# Patient Record
Sex: Female | Born: 1940 | Race: White | Hispanic: No | Marital: Single | Smoking: Former smoker
Health system: Southern US, Community
[De-identification: ages and names within clinical notes are randomized; demographics above are authoritative.]

## PROBLEM LIST (undated history)

## (undated) DIAGNOSIS — G20 Parkinson's disease: Secondary | ICD-10-CM

## (undated) DIAGNOSIS — I2510 Atherosclerotic heart disease of native coronary artery without angina pectoris: Secondary | ICD-10-CM

## (undated) DIAGNOSIS — I219 Acute myocardial infarction, unspecified: Secondary | ICD-10-CM

## (undated) DIAGNOSIS — F329 Major depressive disorder, single episode, unspecified: Secondary | ICD-10-CM

## (undated) DIAGNOSIS — F419 Anxiety disorder, unspecified: Secondary | ICD-10-CM

## (undated) DIAGNOSIS — E0500 Thyrotoxicosis with diffuse goiter without thyrotoxic crisis or storm: Secondary | ICD-10-CM

## (undated) DIAGNOSIS — M797 Fibromyalgia: Secondary | ICD-10-CM

## (undated) DIAGNOSIS — R011 Cardiac murmur, unspecified: Secondary | ICD-10-CM

## (undated) DIAGNOSIS — F32A Depression, unspecified: Secondary | ICD-10-CM

## (undated) DIAGNOSIS — G20A1 Parkinson's disease without dyskinesia, without mention of fluctuations: Secondary | ICD-10-CM

## (undated) DIAGNOSIS — M549 Dorsalgia, unspecified: Secondary | ICD-10-CM

## (undated) DIAGNOSIS — K759 Inflammatory liver disease, unspecified: Secondary | ICD-10-CM

## (undated) DIAGNOSIS — E039 Hypothyroidism, unspecified: Secondary | ICD-10-CM

## (undated) DIAGNOSIS — K219 Gastro-esophageal reflux disease without esophagitis: Secondary | ICD-10-CM

## (undated) DIAGNOSIS — E7800 Pure hypercholesterolemia, unspecified: Secondary | ICD-10-CM

## (undated) HISTORY — PX: ABDOMINAL HYSTERECTOMY: SHX81

## (undated) HISTORY — PX: TUBAL LIGATION: SHX77

## (undated) HISTORY — DX: Thyrotoxicosis with diffuse goiter without thyrotoxic crisis or storm: E05.00

## (undated) HISTORY — PX: ANTERIOR (CYSTOCELE) AND POSTERIOR REPAIR (RECTOCELE) WITH XENFORM GRAFT AND SACROSPINOUS FIXATION: SHX6492

## (undated) HISTORY — DX: Parkinson's disease without dyskinesia, without mention of fluctuations: G20.A1

## (undated) HISTORY — DX: Parkinson's disease: G20

## (undated) HISTORY — DX: Acute myocardial infarction, unspecified: I21.9

---

## 1998-05-20 ENCOUNTER — Emergency Department (HOSPITAL_COMMUNITY): Admission: EM | Admit: 1998-05-20 | Discharge: 1998-05-21 | Payer: Self-pay | Admitting: Internal Medicine

## 1998-08-18 ENCOUNTER — Other Ambulatory Visit: Admission: RE | Admit: 1998-08-18 | Discharge: 1998-08-18 | Payer: Self-pay | Admitting: Obstetrics and Gynecology

## 2001-08-01 ENCOUNTER — Other Ambulatory Visit: Admission: RE | Admit: 2001-08-01 | Discharge: 2001-08-01 | Payer: Self-pay | Admitting: Obstetrics and Gynecology

## 2001-09-08 ENCOUNTER — Encounter: Payer: Self-pay | Admitting: Surgery

## 2001-09-08 ENCOUNTER — Encounter: Admission: RE | Admit: 2001-09-08 | Discharge: 2001-09-08 | Payer: Self-pay | Admitting: Surgery

## 2002-01-26 ENCOUNTER — Encounter: Payer: Self-pay | Admitting: Obstetrics and Gynecology

## 2002-01-27 ENCOUNTER — Inpatient Hospital Stay (HOSPITAL_COMMUNITY): Admission: RE | Admit: 2002-01-27 | Discharge: 2002-01-29 | Payer: Self-pay | Admitting: Obstetrics and Gynecology

## 2002-03-01 ENCOUNTER — Emergency Department (HOSPITAL_COMMUNITY): Admission: EM | Admit: 2002-03-01 | Discharge: 2002-03-01 | Payer: Self-pay | Admitting: Emergency Medicine

## 2005-09-27 ENCOUNTER — Ambulatory Visit (HOSPITAL_COMMUNITY): Admission: RE | Admit: 2005-09-27 | Discharge: 2005-09-27 | Payer: Self-pay | Admitting: Orthopedic Surgery

## 2016-05-07 ENCOUNTER — Emergency Department (HOSPITAL_COMMUNITY): Payer: Medicare Other

## 2016-05-07 ENCOUNTER — Emergency Department (HOSPITAL_COMMUNITY)
Admission: EM | Admit: 2016-05-07 | Discharge: 2016-05-07 | Disposition: A | Discharge status: Home / Self Care | Payer: Medicare Other | Attending: Emergency Medicine | Admitting: Emergency Medicine

## 2016-05-07 DIAGNOSIS — J111 Influenza due to unidentified influenza virus with other respiratory manifestations: Secondary | ICD-10-CM

## 2016-05-07 DIAGNOSIS — R062 Wheezing: Secondary | ICD-10-CM | POA: Insufficient documentation

## 2016-05-07 DIAGNOSIS — J40 Bronchitis, not specified as acute or chronic: Secondary | ICD-10-CM | POA: Insufficient documentation

## 2016-05-07 DIAGNOSIS — Z79899 Other long term (current) drug therapy: Secondary | ICD-10-CM | POA: Diagnosis not present

## 2016-05-07 DIAGNOSIS — R791 Abnormal coagulation profile: Secondary | ICD-10-CM | POA: Insufficient documentation

## 2016-05-07 DIAGNOSIS — J988 Other specified respiratory disorders: Secondary | ICD-10-CM | POA: Diagnosis not present

## 2016-05-07 DIAGNOSIS — R509 Fever, unspecified: Secondary | ICD-10-CM | POA: Diagnosis present

## 2016-05-07 DIAGNOSIS — Z7982 Long term (current) use of aspirin: Secondary | ICD-10-CM | POA: Insufficient documentation

## 2016-05-07 DIAGNOSIS — R69 Illness, unspecified: Secondary | ICD-10-CM

## 2016-05-07 LAB — CBC WITH DIFFERENTIAL/PLATELET
BASOS ABS: 0 10*3/uL (ref 0.0–0.1)
BASOS PCT: 1 %
Eosinophils Absolute: 0.1 10*3/uL (ref 0.0–0.7)
Eosinophils Relative: 2 %
HCT: 43 % (ref 36.0–46.0)
Hemoglobin: 14.2 g/dL (ref 12.0–15.0)
Lymphocytes Relative: 42 %
Lymphs Abs: 2.6 10*3/uL (ref 0.7–4.0)
MCH: 32.6 pg (ref 26.0–34.0)
MCHC: 33 g/dL (ref 30.0–36.0)
MCV: 98.9 fL (ref 78.0–100.0)
MONO ABS: 0.5 10*3/uL (ref 0.1–1.0)
MONOS PCT: 8 %
Neutro Abs: 2.9 10*3/uL (ref 1.7–7.7)
Neutrophils Relative %: 47 %
Platelets: 201 10*3/uL (ref 150–400)
RBC: 4.35 MIL/uL (ref 3.87–5.11)
RDW: 14.2 % (ref 11.5–15.5)
WBC: 6.2 10*3/uL (ref 4.0–10.5)

## 2016-05-07 LAB — URINALYSIS, ROUTINE W REFLEX MICROSCOPIC
Bilirubin Urine: NEGATIVE
Glucose, UA: NEGATIVE mg/dL
Hgb urine dipstick: NEGATIVE
KETONES UR: NEGATIVE mg/dL
LEUKOCYTES UA: NEGATIVE
NITRITE: NEGATIVE
PH: 7 (ref 5.0–8.0)
Protein, ur: NEGATIVE mg/dL
Specific Gravity, Urine: 1.006 (ref 1.005–1.030)

## 2016-05-07 LAB — COMPREHENSIVE METABOLIC PANEL
ALK PHOS: 72 U/L (ref 38–126)
ALT: 17 U/L (ref 14–54)
AST: 28 U/L (ref 15–41)
Albumin: 3.1 g/dL — ABNORMAL LOW (ref 3.5–5.0)
Anion gap: 9 (ref 5–15)
BILIRUBIN TOTAL: 0.5 mg/dL (ref 0.3–1.2)
BUN: 7 mg/dL (ref 6–20)
CALCIUM: 8.9 mg/dL (ref 8.9–10.3)
CO2: 26 mmol/L (ref 22–32)
CREATININE: 0.89 mg/dL (ref 0.44–1.00)
Chloride: 100 mmol/L — ABNORMAL LOW (ref 101–111)
GFR calc Af Amer: >60 mL/min (ref >60)
GLUCOSE: 104 mg/dL — AB (ref 65–99)
POTASSIUM: 4.2 mmol/L (ref 3.5–5.1)
Sodium: 135 mmol/L (ref 135–145)
TOTAL PROTEIN: 6.6 g/dL (ref 6.5–8.1)

## 2016-05-07 LAB — I-STAT CG4 LACTIC ACID, ED
LACTIC ACID, VENOUS: 0.66 mmol/L (ref 0.5–1.9)
Lactic Acid, Venous: 0.99 mmol/L (ref 0.5–1.9)

## 2016-05-07 LAB — PROTIME-INR
INR: 1.07
Prothrombin Time: 13.9 seconds (ref 11.4–15.2)

## 2016-05-07 MED ORDER — HYDROCODONE-ACETAMINOPHEN 5-325 MG PO TABS
2.0000 | ORAL_TABLET | Freq: Once | ORAL | Status: AC
  Administered 2016-05-07: 2 via ORAL
  Filled 2016-05-07: qty 2

## 2016-05-07 MED ORDER — DOXYCYCLINE HYCLATE 100 MG PO CAPS: 100.0000 mg | ORAL_CAPSULE | Freq: Two times a day (BID) | ORAL | 0 refills | Status: DC

## 2016-05-07 MED ORDER — DOXYCYCLINE HYCLATE 100 MG PO TABS
100.0000 mg | ORAL_TABLET | Freq: Once | ORAL | Status: AC
  Administered 2016-05-07: 100 mg via ORAL
  Filled 2016-05-07: qty 1

## 2016-05-07 MED ORDER — AEROCHAMBER PLUS W/MASK MISC
1.0000 | Freq: Once | Status: DC
  Filled 2016-05-07: qty 1

## 2016-05-07 MED ORDER — IPRATROPIUM-ALBUTEROL 0.5-2.5 (3) MG/3ML IN SOLN
3.0000 mL | Freq: Once | RESPIRATORY_TRACT | Status: AC
  Administered 2016-05-07: 3 mL via RESPIRATORY_TRACT
  Filled 2016-05-07: qty 3

## 2016-05-07 MED ORDER — SODIUM CHLORIDE 0.9 % IV BOLUS (SEPSIS)
1000.0000 mL | Freq: Once | INTRAVENOUS | Status: AC
  Administered 2016-05-07: 1000 mL via INTRAVENOUS

## 2016-05-07 MED ORDER — METHYLPREDNISOLONE SODIUM SUCC 125 MG IJ SOLR
125.0000 mg | Freq: Once | INTRAMUSCULAR | Status: AC
  Administered 2016-05-07: 125 mg via INTRAVENOUS
  Filled 2016-05-07: qty 2

## 2016-05-07 MED ORDER — ALBUTEROL SULFATE HFA 108 (90 BASE) MCG/ACT IN AERS
2.0000 | INHALATION_SPRAY | Freq: Once | RESPIRATORY_TRACT | Status: AC
  Administered 2016-05-07: 2 via RESPIRATORY_TRACT
  Filled 2016-05-07: qty 6.7

## 2016-05-07 MED ORDER — PREDNISONE 20 MG PO TABS
40.0000 mg | ORAL_TABLET | Freq: Every day | ORAL | 0 refills | Status: AC
Start: 2016-05-07 — End: 2016-05-12

## 2016-05-07 MED ORDER — OSELTAMIVIR PHOSPHATE 75 MG PO CAPS: 75.0000 mg | ORAL_CAPSULE | Freq: Two times a day (BID) | ORAL | 0 refills | Status: DC

## 2016-05-07 MED ORDER — HYDROCODONE-HOMATROPINE 5-1.5 MG/5ML PO SYRP: 5.0000 mL | ORAL_SOLUTION | ORAL | 0 refills | Status: DC | PRN

## 2016-05-07 NOTE — ED Triage Notes (Addendum)
Cough and body aches since Friday has had chills, states her daughters family had this last week, fever, hurts rt chest when she coughs

## 2016-05-07 NOTE — ED Provider Notes (Signed)
[facility]-EMERGENCY DEPT Provider Note   CSN: [id number] Arrival date & time: 05/07/16  1202     History   Chief Complaint Chief Complaint  Patient presents with  . Cough  . Generalized Body Aches  . Fever    HPI Laura Fritz is a 76 y.o. female.  HPI    76 year old female with history of chronic tobacco dependence, chronic pain, who presents with fever, cough, and myalgias. The patient's symptoms started on Thursday of last week with a mild headache. Over the next 24 hours, she developed progressively worsening productive cough, body aches, and chills. She has had persistent symptoms throughout the weekend with decreased appetite. She has also noticed a mild, sharp, right-sided chest pain that is after coughing. Denies any pain with inspiration. She subsequently presents for evaluation. She does have known sick contacts in her grandchildren, who had similar symptoms. Denies any formal history of COPD but she does use an inhaler as needed intermittently. Denies any chest pain similar to her previous coronary symptoms.  No past medical history on file.  There are no active problems to display for this patient.   No past surgical history on file.  OB History    No data available       Home Medications    Prior to Admission medications   Medication Sig Start Date End Date Taking? Authorizing Provider  amitriptyline (ELAVIL) 25 MG tablet Take 25 mg by mouth at bedtime.   Yes Historical Provider, MD  aspirin EC 81 MG tablet Take 81 mg by mouth daily.   Yes Historical Provider, MD  atenolol (TENORMIN) 25 MG tablet Take 25 mg by mouth 2 (two) times daily.   Yes Historical Provider, MD  Cholecalciferol (VITAMIN D3) 50000 units CAPS Take 50,000 Units by mouth every 14 (fourteen) days.    Yes Historical Provider, MD  clopidogrel (PLAVIX) 75 MG tablet Take 75 mg by mouth daily.   Yes Historical Provider, MD  diphenhydrAMINE (BENADRYL) 25 MG tablet Take 25 mg by mouth every 8 (eight)  hours as needed for itching or allergies.   Yes Historical Provider, MD  gabapentin (NEURONTIN) 300 MG capsule Take 1,200 mg by mouth at bedtime.    Yes Historical Provider, MD  HYDROcodone-acetaminophen (NORCO) 7.5-325 MG tablet Take 1 tablet by mouth every 6 (six) hours as needed for moderate pain.   Yes Historical Provider, MD  loperamide (IMODIUM A-D) 2 MG tablet Take 2 mg by mouth 4 (four) times daily as needed for diarrhea or loose stools.   Yes Historical Provider, MD  Melatonin 10 MG TABS Take 10 mg by mouth at bedtime.   Yes Historical Provider, MD  omeprazole (PRILOSEC) 20 MG capsule Take 20 mg by mouth daily.   Yes Historical Provider, MD  simvastatin (ZOCOR) 20 MG tablet Take 20 mg by mouth daily at 6 PM.    Yes Historical Provider, MD  Vilazodone HCl (VIIBRYD) 40 MG TABS Take 40 mg by mouth daily.    Yes Historical Provider, MD  doxycycline (VIBRAMYCIN) 100 MG capsule Take 1 capsule (100 mg total) by mouth 2 (two) times daily. 05/07/16   Shaune Pollack, MD  HYDROcodone-homatropine [facility]) 5-1.5 MG/5ML syrup Take 5 mLs by mouth every 4 (four) hours as needed for cough. 05/07/16   Shaune Pollack, MD  oseltamivir (TAMIFLU) 75 MG capsule Take 1 capsule (75 mg total) by mouth every 12 (twelve) hours. 05/07/16   Shaune Pollack, MD  predniSONE (DELTASONE) 20 MG tablet Take 2 tablets (40 mg  total) by mouth daily. 05/07/16 05/12/16  Shaune Pollackameron Angad Nabers, MD    Family History No family history on file.  Social History Social History  Substance Use Topics  . Smoking status: Not on file  . Smokeless tobacco: Not on file  . Alcohol use Not on file     Allergies   Tramadol; Levofloxacin; Nsaids; and Sulfa antibiotics   Review of Systems Review of Systems  Constitutional: Positive for chills, fatigue and fever.  HENT: Positive for sore throat. Negative for congestion and rhinorrhea.   Eyes: Negative for visual disturbance.  Respiratory: Positive for cough and shortness of breath. Negative for  wheezing.   Cardiovascular: Negative for chest pain and leg swelling.  Gastrointestinal: Negative for abdominal pain, diarrhea, nausea and vomiting.  Genitourinary: Negative for dysuria, flank pain, vaginal bleeding and vaginal discharge.  Musculoskeletal: Positive for arthralgias and myalgias. Negative for neck pain.  Skin: Negative for rash.  Allergic/Immunologic: Negative for immunocompromised state.  Neurological: Negative for syncope and headaches.  Hematological: Does not bruise/bleed easily.  All other systems reviewed and are negative.    Physical Exam Updated Vital Signs BP 122/92   Pulse (!) 58   Temp 98.4 F (36.9 C) (Oral)   Resp 17   Ht 5\' 4"  (1.626 m)   Wt 143 lb (64.9 kg)   SpO2 100%   BMI 24.55 kg/m   Physical Exam  Constitutional: She is oriented to person, place, and time. She appears well-developed and well-nourished. No distress.  HENT:  Head: Normocephalic and atraumatic.  Eyes: Conjunctivae are normal.  Neck: Neck supple.  Cardiovascular: Normal rate, regular rhythm and normal heart sounds.  Exam reveals no friction rub.   No murmur heard. Pulmonary/Chest: She is in respiratory distress. She has decreased breath sounds. She has wheezes in the right upper field, the right middle field, the right lower field, the left upper field, the left middle field and the left lower field. She has no rales.  Abdominal: She exhibits no distension.  Musculoskeletal: She exhibits no edema.  Neurological: She is alert and oriented to person, place, and time. She exhibits normal muscle tone.  Skin: Skin is warm. Capillary refill takes less than 2 seconds.  Psychiatric: She has a normal mood and affect.  Nursing note and vitals reviewed.    ED Treatments / Results  Labs (all labs ordered are listed, but only abnormal results are displayed) Labs Reviewed  COMPREHENSIVE METABOLIC PANEL - Abnormal; Notable for the following:       Result Value   Chloride 100 (*)     Glucose, Bld 104 (*)    Albumin 3.1 (*)    All other components within normal limits  URINALYSIS, ROUTINE W REFLEX MICROSCOPIC - Abnormal; Notable for the following:    APPearance HAZY (*)    All other components within normal limits  URINE CULTURE  CBC WITH DIFFERENTIAL/PLATELET  PROTIME-INR  I-STAT CG4 LACTIC ACID, ED  I-STAT CG4 LACTIC ACID, ED    EKG  EKG Interpretation None       Radiology Dg Chest 2 View  Result Date: 05/07/2016 CLINICAL DATA:  Cough, shortness of breath for 3 days EXAM: CHEST  2 VIEW COMPARISON:  09/26/2005 FINDINGS: The heart size and mediastinal contours are within normal limits. Both lungs are clear. The visualized skeletal structures are unremarkable. IMPRESSION: No active cardiopulmonary disease. Electronically Signed   By: Elige KoHetal  Patel   On: 05/07/2016 12:55    Procedures Procedures (including critical care time)  Medications  Ordered in ED Medications  aerochamber plus with mask device 1 each (1 each Other Not Given 05/07/16 1752)  sodium chloride 0.9 % bolus 1,000 mL (0 mLs Intravenous Stopped 05/07/16 1756)  HYDROcodone-acetaminophen (NORCO/VICODIN) 5-325 MG per tablet 2 tablet (2 tablets Oral Given 05/07/16 1548)  methylPREDNISolone sodium succinate (SOLU-MEDROL) 125 mg/2 mL injection 125 mg (125 mg Intravenous Given 05/07/16 1548)  ipratropium-albuterol (DUONEB) 0.5-2.5 (3) MG/3ML nebulizer solution 3 mL (3 mLs Nebulization Given 05/07/16 1548)  doxycycline (VIBRA-TABS) tablet 100 mg (100 mg Oral Given 05/07/16 1548)  albuterol (PROVENTIL HFA;VENTOLIN HFA) 108 (90 Base) MCG/ACT inhaler 2 puff (2 puffs Inhalation Given 05/07/16 1753)     Initial Impression / Assessment and Plan / ED Course  I have reviewed the triage vital signs and the nursing notes.  Pertinent labs & imaging results that were available during my care of the patient were reviewed by me and considered in my medical decision making (see chart for details).  Clinical Course      76 year old female with past medical history as above who presents with cough, generalized body aches, and fevers. On arrival, patient overall very well-appearing. She is satting well on room air with normal work of breathing. She does have diffuse expiratory wheezing but is speaking in full sentences. Labwork reviewed and is overall very reassuring. She has no leukocytosis, AKI, and lactic acid is normal. She has no evidence to suggest systemic illness or sepsis. Suspect patient has viral URI versus influenza versus COPD exacerbation. She has been given fluids, steroids, and a breathing treatment here with resolution of her wheezing. She would like to go home which I feel is reasonable given her otherwise well appearance reassuring labs. Will place her on antibiotics and steroids for her COPD component, start Tamiflu, and advise close outpatient follow-up with good return precautions.  Final Clinical Impressions(s) / ED Diagnoses   Final diagnoses:  Wheezing-associated respiratory infection (WARI)  Bronchitis  Influenza-like illness    New Prescriptions New Prescriptions   DOXYCYCLINE (VIBRAMYCIN) 100 MG CAPSULE    Take 1 capsule (100 mg total) by mouth 2 (two) times daily.   HYDROCODONE-HOMATROPINE (HYCODAN) 5-1.5 MG/5ML SYRUP    Take 5 mLs by mouth every 4 (four) hours as needed for cough.   OSELTAMIVIR (TAMIFLU) 75 MG CAPSULE    Take 1 capsule (75 mg total) by mouth every 12 (twelve) hours.   PREDNISONE (DELTASONE) 20 MG TABLET    Take 2 tablets (40 mg total) by mouth daily.     Shaune Pollack, MD 05/07/16 [phone]

## 2016-05-07 NOTE — ED Notes (Signed)
PT did not have to use RR at this time  

## 2016-05-07 NOTE — ED Notes (Signed)
IV removed.

## 2016-05-08 LAB — URINE CULTURE

## 2016-05-13 ENCOUNTER — Emergency Department (HOSPITAL_COMMUNITY)
Admission: EM | Admit: 2016-05-13 | Discharge: 2016-05-14 | Disposition: A | Discharge status: Rehabilitation Facility | Payer: Medicare Other | Attending: Emergency Medicine | Admitting: Emergency Medicine

## 2016-05-13 ENCOUNTER — Encounter (HOSPITAL_COMMUNITY): Payer: Self-pay | Admitting: *Deleted

## 2016-05-13 DIAGNOSIS — Z87891 Personal history of nicotine dependence: Secondary | ICD-10-CM | POA: Insufficient documentation

## 2016-05-13 DIAGNOSIS — R45851 Suicidal ideations: Secondary | ICD-10-CM | POA: Diagnosis not present

## 2016-05-13 DIAGNOSIS — Z79899 Other long term (current) drug therapy: Secondary | ICD-10-CM | POA: Insufficient documentation

## 2016-05-13 DIAGNOSIS — F1123 Opioid dependence with withdrawal: Secondary | ICD-10-CM

## 2016-05-13 DIAGNOSIS — F1193 Opioid use, unspecified with withdrawal: Secondary | ICD-10-CM

## 2016-05-13 DIAGNOSIS — Z7982 Long term (current) use of aspirin: Secondary | ICD-10-CM | POA: Diagnosis not present

## 2016-05-13 DIAGNOSIS — Z7902 Long term (current) use of antithrombotics/antiplatelets: Secondary | ICD-10-CM | POA: Diagnosis not present

## 2016-05-13 DIAGNOSIS — F332 Major depressive disorder, recurrent severe without psychotic features: Secondary | ICD-10-CM | POA: Diagnosis present

## 2016-05-13 DIAGNOSIS — F1120 Opioid dependence, uncomplicated: Secondary | ICD-10-CM

## 2016-05-13 DIAGNOSIS — Z888 Allergy status to other drugs, medicaments and biological substances status: Secondary | ICD-10-CM | POA: Diagnosis not present

## 2016-05-13 HISTORY — DX: Fibromyalgia: M79.7

## 2016-05-13 HISTORY — DX: Major depressive disorder, single episode, unspecified: F32.9

## 2016-05-13 HISTORY — DX: Anxiety disorder, unspecified: F41.9

## 2016-05-13 HISTORY — DX: Dorsalgia, unspecified: M54.9

## 2016-05-13 HISTORY — DX: Gastro-esophageal reflux disease without esophagitis: K21.9

## 2016-05-13 HISTORY — DX: Pure hypercholesterolemia, unspecified: E78.00

## 2016-05-13 HISTORY — DX: Depression, unspecified: F32.A

## 2016-05-13 LAB — CBC
HCT: 41.5 % (ref 36.0–46.0)
Hemoglobin: 14.2 g/dL (ref 12.0–15.0)
MCH: 32.3 pg (ref 26.0–34.0)
MCHC: 34.2 g/dL (ref 30.0–36.0)
MCV: 94.5 fL (ref 78.0–100.0)
PLATELETS: 274 10*3/uL (ref 150–400)
RBC: 4.39 MIL/uL (ref 3.87–5.11)
RDW: 13.8 % (ref 11.5–15.5)
WBC: 10.5 10*3/uL (ref 4.0–10.5)

## 2016-05-13 LAB — COMPREHENSIVE METABOLIC PANEL
ALK PHOS: 78 U/L (ref 38–126)
ALT: 20 U/L (ref 14–54)
ANION GAP: 9 (ref 5–15)
AST: 22 U/L (ref 15–41)
Albumin: 3.4 g/dL — ABNORMAL LOW (ref 3.5–5.0)
BUN: 15 mg/dL (ref 6–20)
CALCIUM: 8.7 mg/dL — AB (ref 8.9–10.3)
CHLORIDE: 107 mmol/L (ref 101–111)
CO2: 26 mmol/L (ref 22–32)
Creatinine, Ser: 0.8 mg/dL (ref 0.44–1.00)
Glucose, Bld: 93 mg/dL (ref 65–99)
Potassium: 3.3 mmol/L — ABNORMAL LOW (ref 3.5–5.1)
SODIUM: 142 mmol/L (ref 135–145)
Total Bilirubin: 0.7 mg/dL (ref 0.3–1.2)
Total Protein: 6.4 g/dL — ABNORMAL LOW (ref 6.5–8.1)

## 2016-05-13 LAB — RAPID URINE DRUG SCREEN, HOSP PERFORMED
Amphetamines: NOT DETECTED
BENZODIAZEPINES: NOT DETECTED
Barbiturates: NOT DETECTED
COCAINE: NOT DETECTED
Opiates: POSITIVE — AB
Tetrahydrocannabinol: NOT DETECTED

## 2016-05-13 LAB — URINALYSIS, ROUTINE W REFLEX MICROSCOPIC
Bilirubin Urine: NEGATIVE
GLUCOSE, UA: NEGATIVE mg/dL
Hgb urine dipstick: NEGATIVE
Ketones, ur: NEGATIVE mg/dL
LEUKOCYTES UA: NEGATIVE
NITRITE: NEGATIVE
PH: 5 (ref 5.0–8.0)
Protein, ur: NEGATIVE mg/dL
Specific Gravity, Urine: 1.017 (ref 1.005–1.030)

## 2016-05-13 LAB — LIPASE, BLOOD: Lipase: 19 U/L (ref 11–51)

## 2016-05-13 LAB — SALICYLATE LEVEL

## 2016-05-13 LAB — ACETAMINOPHEN LEVEL

## 2016-05-13 LAB — ETHANOL

## 2016-05-13 MED ORDER — ACETAMINOPHEN 500 MG PO TABS
1000.0000 mg | ORAL_TABLET | Freq: Once | ORAL | Status: AC
  Administered 2016-05-13: 1000 mg via ORAL
  Filled 2016-05-13: qty 2

## 2016-05-13 MED ORDER — LOPERAMIDE HCL 2 MG PO CAPS
4.0000 mg | ORAL_CAPSULE | Freq: Once | ORAL | Status: AC
  Administered 2016-05-13: 4 mg via ORAL
  Filled 2016-05-13: qty 2

## 2016-05-13 MED ORDER — ONDANSETRON 4 MG PO TBDP
4.0000 mg | ORAL_TABLET | Freq: Once | ORAL | Status: AC
  Administered 2016-05-13: 4 mg via ORAL
  Filled 2016-05-13: qty 1

## 2016-05-13 NOTE — ED Notes (Signed)
No vomiting or diarrhea noted since pt's arrival; pt has been resting with eyes closed

## 2016-05-13 NOTE — ED Notes (Signed)
Pt's belongings Pink pants Pink shirt Pink shoes Black flowered bag Be[employer] HomesBrown wallet Pink notebook Denture cup with upper denture Denture cup without dentures fixodent 2 packs of cigarettes Change purse Black cell phone with a Counselling psychologistcharger Scissors Pill box with meds Glasses Sanitary pads rantidine  Melatonin Nitrostat viibyrd Simvastatin Gabapentin immodium x2 Vitamin d3 Omeprazole Aspirin atenololclopidogrel proventil inhaler doxycycline

## 2016-05-13 NOTE — [facility] Counselor (Signed)
Pt has been fax[facility] the [facility], [facility].  Princess BruinsAquicha Duff, MSW, Theresia MajorsLCSWA

## 2016-05-13 NOTE — ED Notes (Signed)
Per TTS pt qualifies for inpatient admission and is awaiting Geri-pysch placement

## 2016-05-13 NOTE — ED Triage Notes (Signed)
Pt arrives to the ER via EMS for complaints of SI and opoid withdrawal; pt states that she was seen on 1/15 and was diagnosed with Bronchitis and the flu; pt states that she was feeling better and began having N/V/D and body aches 3 days ago; pt states that she has been on hydrocodone for years and has not had any in a week; pt states that she believes some of the symptoms are related to withdrawal; pt states that she has depression that has gotten worse and states "I don't want to live anymore" pt states that she has a plan to overdose and states that she just hoped that the flu would have killed her; pt states that her husband died 6 yrs ago and she is still having trouble dealing with it; pt states that her house was foreclosed on in Nov and she has been living with her daughter and states "She just doesn't have the patience for me"; pt also reports that she had a daughter die when the child was [age]yr old and her son was murdered when he was 16411yr old; pt states that this has all just been too much and she cannot deal with it anymore and just wants to die

## 2016-05-13 NOTE — ED Notes (Signed)
Bed: [id number] Expected date:  Expected time:  Means of arrival:  Comments: 76 yo SI

## 2016-05-13 NOTE — ED Provider Notes (Signed)
[facility]-EMERGENCY DEPT Provider Note   CSN: [id number] Arrival date & time: 05/13/16  1709     History   Chief Complaint Chief Complaint  Patient presents with  . Suicidal    HPI Laura Fritz is a 76 y.o. female.  HPI 76 year old female who presents with opiate withdrawal and suicidal ideation. She has a history of chronic back pain, anxiety and depression. States that she has been visiting her daughter since Christmas time from [place]. States that she ran out of her hydrocodone on January 15, and had initial plans to return to [place] but had influenza-like symptoms for which she was seen in the ED. This prevented her from returning to [place]. Over the past week she has had intermittent nausea, vomiting, diarrhea, body aches and pains, intermittent abdominal cramping, intermittent headache. States that she thinks she has an withdrawing from her hydrocodone which she did not know was in opiate until recently. Had fevers or chills. Denies any alcohol abuse.  Also states a long-standing history of depression. States that things have been getting worse since her husband died 6 years ago. States that she wakes up every morning feeling suicidal and wanting to be with her husband. States that her daughter doesn't want to help her and doesn't have the patient's for her. Also states that she had children that died in the 60s to 90s which initially started her depression. She says that recently her home in [place] has been foreclosed. Due to all the recent stressors she has had increasing suicidal thoughts. States that she would have overdosed on her pills.    Past Medical History:  Diagnosis Date  . Anxiety   . Back pain   . Depression   . Fibromyalgia   . GERD (gastroesophageal reflux disease)   . Hypercholesteremia     There are no active problems to display for this patient.   History reviewed. No pertinent surgical history.  OB History    No data  available       Home Medications    Prior to Admission medications   Medication Sig Start Date End Date Taking? Authorizing Provider  amitriptyline (ELAVIL) 25 MG tablet Take 25 mg by mouth at bedtime.   Yes Historical Provider, MD  amphetamine-dextroamphetamine (ADDERALL) 10 MG tablet Take 20 mg by mouth 2 (two) times daily with a meal. Take 20mg  twice daily with meals in the morning and at Noon   Yes Historical Provider, MD  aspirin EC 81 MG tablet Take 81 mg by mouth daily.   Yes Historical Provider, MD  atenolol (TENORMIN) 25 MG tablet Take 25 mg by mouth 2 (two) times daily.   Yes Historical Provider, MD  Cholecalciferol (VITAMIN D3) 50000 units CAPS Take 50,000 Units by mouth every 14 (fourteen) days.    Yes Historical Provider, MD  clopidogrel (PLAVIX) 75 MG tablet Take 75 mg by mouth daily.   Yes Historical Provider, MD  diphenhydrAMINE (BENADRYL) 25 MG tablet Take 25 mg by mouth every 8 (eight) hours as needed for itching or allergies.   Yes Historical Provider, MD  doxycycline (VIBRAMYCIN) 100 MG capsule Take 1 capsule (100 mg total) by mouth 2 (two) times daily. 05/07/16  Yes Shaune Pollack, MD  gabapentin (NEURONTIN) 300 MG capsule Take 1,200 mg by mouth at bedtime.    Yes Historical Provider, MD  loperamide (IMODIUM A-D) 2 MG tablet Take 8 mg by mouth 4 (four) times daily as needed for diarrhea or loose stools.  Yes Historical Provider, MD  Melatonin 10 MG TABS Take 10 mg by mouth at bedtime.   Yes Historical Provider, MD  metoprolol tartrate (LOPRESSOR) 25 MG tablet Take 25 mg by mouth 2 (two) times daily.   Yes Historical Provider, MD  nitroGLYCERIN (NITROSTAT) 0.4 MG SL tablet Place 0.4 mg under the tongue every 5 (five) minutes as needed for chest pain.   Yes Historical Provider, MD  omeprazole (PRILOSEC) 20 MG capsule Take 20 mg by mouth daily.   Yes Historical Provider, MD  simvastatin (ZOCOR) 20 MG tablet Take 20 mg by mouth daily at 6 PM.    Yes Historical Provider, MD    Vilazodone HCl (VIIBRYD) 40 MG TABS Take 40 mg by mouth daily.    Yes Historical Provider, MD  HYDROcodone-acetaminophen (NORCO) 7.5-325 MG tablet Take 1 tablet by mouth every 6 (six) hours as needed for moderate pain.    Historical Provider, MD  HYDROcodone-homatropine (HYCODAN) 5-1.5 MG/5ML syrup Take 5 mLs by mouth every 4 (four) hours as needed for cough. Patient not taking: Reported on 05/13/2016 05/07/16   Shaune Pollackameron Isaacs, MD  oseltamivir (TAMIFLU) 75 MG capsule Take 1 capsule (75 mg total) by mouth every 12 (twelve) hours. Patient not taking: Reported on 05/13/2016 05/07/16   Shaune Pollackameron Isaacs, MD    Family History No family history on file.  Social History Social History  Substance Use Topics  . Smoking status: Former Games developermoker  . Smokeless tobacco: Never Used  . Alcohol use Yes     Allergies   Tramadol; Levofloxacin; Nsaids; and Sulfa antibiotics   Review of Systems Review of Systems 10/14 systems reviewed and are negative other than those stated in the HPI   Physical Exam Updated Vital Signs BP 125/72 (BP Location: Right Arm)   Pulse 75   Temp 99.1 F (37.3 C) (Oral)   Resp 18   Ht 5\' 4"  (1.626 m)   Wt 140 lb (63.5 kg)   SpO2 91%   BMI 24.03 kg/m   Physical Exam Physical Exam  Nursing note and vitals reviewed. Constitutional:non-toxic, and in no acute distress Head: Normocephalic and atraumatic.  Mouth/Throat: Oropharynx is clear and moist.  Neck: Normal range of motion. Neck supple.  Cardiovascular: Normal rate and regular rhythm.   Pulmonary/Chest: Effort normal and breath sounds normal.  Abdominal: Soft. There is no tenderness. There is no rebound and no guarding.  Musculoskeletal: Normal range of motion.  Neurological: Alert, no facial droop, fluent speech, moves all extremities symmetrically Skin: Skin is warm and dry.  Psychiatric: Cooperative   ED Treatments / Results  Labs (all labs ordered are listed, but only abnormal results are displayed) Labs  Reviewed  COMPREHENSIVE METABOLIC PANEL - Abnormal; Notable for the following:       Result Value   Potassium 3.3 (*)    Calcium 8.7 (*)    Total Protein 6.4 (*)    Albumin 3.4 (*)    All other components within normal limits  ACETAMINOPHEN LEVEL - Abnormal; Notable for the following:    Acetaminophen (Tylenol), Serum <10 (*)    All other components within normal limits  RAPID URINE DRUG SCREEN, HOSP PERFORMED - Abnormal; Notable for the following:    Opiates POSITIVE (*)    All other components within normal limits  ETHANOL  SALICYLATE LEVEL  CBC  LIPASE, BLOOD  URINALYSIS, ROUTINE W REFLEX MICROSCOPIC    EKG  EKG Interpretation None       Radiology No results found.  Procedures  Procedures (including critical care time)  Medications Ordered in ED Medications  acetaminophen (TYLENOL) tablet 650 mg (not administered)  LORazepam (ATIVAN) tablet 1 mg (not administered)  ondansetron (ZOFRAN) tablet 4 mg (not administered)  loperamide (IMODIUM) capsule 4 mg (not administered)  amitriptyline (ELAVIL) tablet 25 mg (not administered)  amphetamine-dextroamphetamine (ADDERALL) tablet 20 mg (not administered)  aspirin EC tablet 81 mg (not administered)  atenolol (TENORMIN) tablet 25 mg (not administered)  Vitamin D3 CAPS 50,000 Units (not administered)  clopidogrel (PLAVIX) tablet 75 mg (not administered)  gabapentin (NEURONTIN) capsule 1,200 mg (not administered)  Melatonin TABS 10 mg (not administered)  metoprolol tartrate (LOPRESSOR) tablet 25 mg (not administered)  pantoprazole (PROTONIX) EC tablet 40 mg (not administered)  simvastatin (ZOCOR) tablet 20 mg (not administered)  Vilazodone HCl (VIIBRYD) TABS 40 mg (not administered)  ondansetron (ZOFRAN-ODT) disintegrating tablet 4 mg (4 mg Oral Given 05/13/16 1841)  loperamide (IMODIUM) capsule 4 mg (4 mg Oral Given 05/13/16 1841)  acetaminophen (TYLENOL) tablet 1,000 mg (1,000 mg Oral Given 05/13/16 1841)     Initial  Impression / Assessment and Plan / ED Course  I have reviewed the triage vital signs and the nursing notes.  Pertinent labs & imaging results that were available during my care of the patient were reviewed by me and considered in my medical decision making (see chart for details).     Will treat symptoms of opiate withdrawal, and we'll treat symptomatically in ED. Vital signs stable. Blood work reassuring. No major electrolyte or metabolic derangements. Medically cleared for TTS consult for severe depression and SI.  TTS recommending inpatient admission, with placement pending at this time.   Final Clinical Impressions(s) / ED Diagnoses   Final diagnoses:  Suicidal ideation  Opiate withdrawal (HCC)    New Prescriptions New Prescriptions   No medications on file     Lavera Guise, MD 05/14/16 0120

## 2016-05-13 NOTE — BH Assessment (Signed)
Tele Assessment Note   Laura Fritz is an 76 y.o. female who presents to the ED voluntarily. [place] [facility] [place] [place] to be with her children. Pt reports she was not able to go [facility] due to becoming ill and getting the flu while she was here. Pt reports she was living with her daughter while she was here but she is currently homeless because her daughter does not want her to stay with her any longer. Pt reports she [place] her birthday which was [birth year] due to a foreclosure.    Pt reports she has been severely depressed since her husband passed away 6 years ago. Pt tearful throughout the assessment. Pt reports her depression began when her 74 year old daughter passed away in [id number]. Pt reports her son was killed 2 years later in a random act. Pt reports she continued to be depressed throughout her life but it worsened after her husband died 6 years ago. Pt reports she was married for 27 years. Pt was crying and stated "I had the best husband in the world." Pt reports she had 6 children with her husband.   Pt continued to state that she is "tired" and "wants to die." Pt denies an active plan but states she has been planning her death for some time and has been giving away her personal belongings in the event she dies. Pt states she wakes up and thinks "maybe today is the day I will finally die." Pt states she recently got over the flu and she was hoping the flu would kill her.    Pt reports she has a [facility] that she [facility] and she was taking Hydrocodone for many years. Pt reports due to not being [facility], she has been going through opioid withdrawals. Pt reports multiple physical ailments including diarrhea, feeling naesuos, and fibromyalgia pain, pain in her hips, and numerous falls. Pt reports she is always in pain and does not want to be alive.   Per Nira ConnJason Berry, FNP pt meets  inpt criteria for gero-psych placement. TTS to seek. Case disposition reviewed with Elmarie Shileyiffany, RN.    Diagnosis: Major Depressive D/O severe, w/o psychotic features   Past Medical History:  Past Medical History:  Diagnosis Date   Anxiety    Back pain    Depression    Fibromyalgia    GERD (gastroesophageal reflux disease)    Hypercholesteremia     History reviewed. No pertinent surgical history.  Family History: No family history on file.  Social History:  reports that she has quit smoking. She has never used smokeless tobacco. She reports that she drinks alcohol. She reports that she does not use drugs.  Additional Social History:  Alcohol / Drug Use Pain Medications: See PTA meds  Prescriptions: See PTA meds  Over the Counter: See PTA meds  History of alcohol / drug use?: No history of alcohol / drug abuse  CIWA: CIWA-Ar BP: 140/82 Pulse Rate: 75 COWS:    PATIENT STRENGTHS: (choose at least two) Average or above average intelligence Capable of independent living Communication skills Financial means Religious Affiliation  Allergies:  Allergies  Allergen Reactions   Tramadol Anaphylaxis    Oral cavity tremors   Levofloxacin Other (See Comments)    Tendonitis   Nsaids Rash   Sulfa Antibiotics Rash    Home Medications:  (  Not in a hospital admission)  OB/GYN Status:  No LMP recorded. Patient is postmenopausal.  General Assessment Data Location of Assessment: [facility] TTS Assessment: In system Is this a Tele or Face-to-Face Assessment?: Face-to-Face Is this an Initial Assessment or a Re-assessment for this encounter?: Initial Assessment Marital status: Widowed Is patient pregnant?: No Pregnancy Status: No Living Arrangements: Other (Comment) (homeless) Can pt return to current living arrangement?: Yes Admission Status: Voluntary Is patient capable of signing voluntary admission?: Yes Referral Source: Self/Family/Friend Insurance type: Medicare      Crisis Care Plan Living Arrangements: Other (Comment) (homeless) Name of Psychiatrist: Dr. Corky Downs in [facility]  Name of Therapist: Dr. Corky Downs in [facility]  Education Status Is patient currently in school?: No Highest grade of school patient has completed: college  Name of school: [employer]  Risk to self with the past 6 months Suicidal Ideation: Yes-Currently Present Has patient been a risk to self within the past 6 months prior to admission? : Yes Suicidal Intent: No Has patient had any suicidal intent within the past 6 months prior to admission? : No Is patient at risk for suicide?: Yes Suicidal Plan?: No Has patient had any suicidal plan within the past 6 months prior to admission? : No Access to Means: No What has been your use of drugs/alcohol within the last 12 months?: denies Previous Attempts/Gestures: No Triggers for Past Attempts: None known Intentional Self Injurious Behavior: None Family Suicide History: No Recent stressful life event(s): Loss (Comment), Trauma (Comment), Turmoil (Comment) (pt lost her home, husband died 6 years ago) Persecutory voices/beliefs?: No Depression: Yes Depression Symptoms: Despondent, Insomnia, Tearfulness, Fatigue, Loss of interest in usual pleasures, Feeling worthless/self pity Substance abuse history and/or treatment for substance abuse?: No Suicide prevention information given to non-admitted patients: Not applicable  Risk to Others within the past 6 months Homicidal Ideation: No Does patient have any lifetime risk of violence toward others beyond the six months prior to admission? : No Thoughts of Harm to Others: No Current Homicidal Intent: No Current Homicidal Plan: No Access to Homicidal Means: No History of harm to others?: No Assessment of Violence: None Noted Does patient have access to weapons?: Yes (Comment) (pt reports she has access to a gun ) Criminal Charges Pending?: No Does patient have a court date: No Is patient on probation?:  No  Psychosis Hallucinations: None noted Delusions: None noted  Mental Status Report Appearance/Hygiene: Disheveled, In scrubs Eye Contact: Fair Motor Activity: Freedom of movement Speech: Logical/coherent Level of Consciousness: Alert, Crying Mood: Depressed, Sad, Helpless, Sullen Affect: Depressed, Sad Anxiety Level: None Thought Processes: Coherent, Relevant Judgement: Partial Orientation: Person, Place, Time, Situation, Appropriate for developmental age Obsessive Compulsive Thoughts/Behaviors: None  Cognitive Functioning Concentration: Normal Memory: Recent Intact, Remote Intact IQ: Average Insight: Fair Impulse Control: Fair Appetite: Poor Sleep: Decreased Total Hours of Sleep: 3 Vegetative Symptoms: None  ADLScreening [facility]) Patient's cognitive ability adequate to safely complete daily activities?: Yes Patient able to express need for assistance with ADLs?: Yes Independently performs ADLs?: Yes (appropriate for developmental age)  Prior Inpatient Therapy Prior Inpatient Therapy: No  Prior Outpatient Therapy Prior Outpatient Therapy: Yes Prior Therapy Dates: current Prior Therapy Facilty/Provider(s): Dr. Corky Downs in [facility]  Reason for Treatment: Depression Does patient have an ACCT team?: No Does patient have Intensive In-House Services?  : No Does patient have [facility] services? : No Does patient have P4CC services?: No  ADL Screening (condition at time of admission) Patient's cognitive ability adequate to safely complete  daily activities?: Yes Is the patient deaf or have difficulty hearing?: No Does the patient have difficulty seeing, even when wearing glasses/contacts?: No Does the patient have difficulty concentrating, remembering, or making decisions?: No Patient able to express need for assistance with ADLs?: Yes Does the patient have difficulty dressing or bathing?: No Independently performs ADLs?: Yes (appropriate for developmental  age) Does the patient have difficulty walking or climbing stairs?: Yes (pt reports she has fibromialgia which causes a lot of pain ) Weakness of Legs: Both Weakness of Arms/Hands: Both  Home Assistive Devices/Equipment Home Assistive Devices/Equipment: None    Abuse/Neglect Assessment (Assessment to be complete while patient is alone) Physical Abuse: Denies Verbal Abuse: Denies Sexual Abuse: Denies Exploitation of patient/patient's resources: Denies Self-Neglect: Denies     Merchant navy officer (For Healthcare) Does Patient Have a Medical Advance Directive?: Yes Type of Advance Directive: Living will Would patient like information on creating a medical advance directive?: No - Patient declined    Additional Information 1:1 In Past 12 Months?: No CIRT Risk: No Elopement Risk: No Does patient have medical clearance?: Yes     Disposition:  Disposition Initial Assessment Completed for this Encounter: Yes Disposition of Patient: Inpatient treatment program Type of inpatient treatment program: Adult (per Nira Conn, FNP )  Karolee Ohs 05/13/2016 8:56 PM

## 2016-05-14 DIAGNOSIS — F1120 Opioid dependence, uncomplicated: Secondary | ICD-10-CM | POA: Diagnosis not present

## 2016-05-14 DIAGNOSIS — F332 Major depressive disorder, recurrent severe without psychotic features: Secondary | ICD-10-CM | POA: Diagnosis not present

## 2016-05-14 DIAGNOSIS — Z79899 Other long term (current) drug therapy: Secondary | ICD-10-CM

## 2016-05-14 DIAGNOSIS — Z882 Allergy status to sulfonamides status: Secondary | ICD-10-CM

## 2016-05-14 DIAGNOSIS — Z87891 Personal history of nicotine dependence: Secondary | ICD-10-CM | POA: Diagnosis not present

## 2016-05-14 DIAGNOSIS — Z888 Allergy status to other drugs, medicaments and biological substances status: Secondary | ICD-10-CM | POA: Diagnosis not present

## 2016-05-14 DIAGNOSIS — R45851 Suicidal ideations: Secondary | ICD-10-CM

## 2016-05-14 DIAGNOSIS — Z7982 Long term (current) use of aspirin: Secondary | ICD-10-CM

## 2016-05-14 DIAGNOSIS — F1123 Opioid dependence with withdrawal: Secondary | ICD-10-CM | POA: Diagnosis not present

## 2016-05-14 MED ORDER — DULOXETINE HCL 30 MG PO CPEP
30.0000 mg | ORAL_CAPSULE | Freq: Every day | ORAL | Status: DC
  Administered 2016-05-14: 30 mg via ORAL
  Filled 2016-05-14: qty 1

## 2016-05-14 MED ORDER — ASPIRIN EC 81 MG PO TBEC
81.0000 mg | DELAYED_RELEASE_TABLET | Freq: Every day | ORAL | Status: DC
  Administered 2016-05-14: 81 mg via ORAL
  Filled 2016-05-14: qty 1

## 2016-05-14 MED ORDER — CLONIDINE HCL 0.1 MG PO TABS: 0.1000 mg | ORAL_TABLET | ORAL | Status: DC

## 2016-05-14 MED ORDER — METHOCARBAMOL 500 MG PO TABS
500.0000 mg | ORAL_TABLET | Freq: Three times a day (TID) | ORAL | Status: DC | PRN
  Administered 2016-05-14: 500 mg via ORAL
  Filled 2016-05-14: qty 1

## 2016-05-14 MED ORDER — AMPHETAMINE-DEXTROAMPHETAMINE 20 MG PO TABS: 20.0000 mg | ORAL_TABLET | Freq: Two times a day (BID) | ORAL | Status: DC

## 2016-05-14 MED ORDER — ATENOLOL 25 MG PO TABS: 25.0000 mg | ORAL_TABLET | Freq: Two times a day (BID) | ORAL | Status: DC

## 2016-05-14 MED ORDER — DICYCLOMINE HCL 20 MG PO TABS: 20.0000 mg | ORAL_TABLET | Freq: Four times a day (QID) | ORAL | Status: DC | PRN

## 2016-05-14 MED ORDER — ONDANSETRON 4 MG PO TBDP: 4.0000 mg | ORAL_TABLET | Freq: Four times a day (QID) | ORAL | Status: DC | PRN

## 2016-05-14 MED ORDER — ALBUTEROL SULFATE (2.5 MG/3ML) 0.083% IN NEBU
3.0000 mL | INHALATION_SOLUTION | Freq: Four times a day (QID) | RESPIRATORY_TRACT | Status: DC | PRN
  Administered 2016-05-14: 3 mL via RESPIRATORY_TRACT
  Filled 2016-05-14: qty 3

## 2016-05-14 MED ORDER — ONDANSETRON HCL 4 MG PO TABS: 4.0000 mg | ORAL_TABLET | Freq: Three times a day (TID) | ORAL | Status: DC | PRN

## 2016-05-14 MED ORDER — LOPERAMIDE HCL 2 MG PO CAPS: 2.0000 mg | ORAL_CAPSULE | ORAL | Status: DC | PRN

## 2016-05-14 MED ORDER — LOPERAMIDE HCL 2 MG PO CAPS
4.0000 mg | ORAL_CAPSULE | Freq: Four times a day (QID) | ORAL | Status: DC | PRN
  Administered 2016-05-14: 4 mg via ORAL
  Filled 2016-05-14: qty 2

## 2016-05-14 MED ORDER — MELATONIN 10 MG PO TABS: 10.0000 mg | ORAL_TABLET | Freq: Every day | ORAL | Status: DC

## 2016-05-14 MED ORDER — CLOPIDOGREL BISULFATE 75 MG PO TABS
75.0000 mg | ORAL_TABLET | Freq: Every day | ORAL | Status: DC
  Administered 2016-05-14: 75 mg via ORAL
  Filled 2016-05-14: qty 1

## 2016-05-14 MED ORDER — CLONIDINE HCL 0.1 MG PO TABS: 0.1000 mg | ORAL_TABLET | Freq: Every day | ORAL | Status: DC

## 2016-05-14 MED ORDER — AMITRIPTYLINE HCL 25 MG PO TABS
25.0000 mg | ORAL_TABLET | Freq: Every day | ORAL | Status: DC
  Administered 2016-05-14: 25 mg via ORAL
  Filled 2016-05-14: qty 1

## 2016-05-14 MED ORDER — HYDROXYZINE HCL 25 MG PO TABS: 25.0000 mg | ORAL_TABLET | Freq: Four times a day (QID) | ORAL | Status: DC | PRN

## 2016-05-14 MED ORDER — ACETAMINOPHEN 325 MG PO TABS
650.0000 mg | ORAL_TABLET | ORAL | Status: DC | PRN
  Administered 2016-05-14: 650 mg via ORAL
  Filled 2016-05-14: qty 2

## 2016-05-14 MED ORDER — METOPROLOL TARTRATE 25 MG PO TABS
25.0000 mg | ORAL_TABLET | Freq: Two times a day (BID) | ORAL | Status: DC
  Administered 2016-05-14 (×2): 25 mg via ORAL
  Filled 2016-05-14 (×2): qty 1

## 2016-05-14 MED ORDER — PANTOPRAZOLE SODIUM 40 MG PO TBEC
40.0000 mg | DELAYED_RELEASE_TABLET | Freq: Every day | ORAL | Status: DC
  Administered 2016-05-14: 40 mg via ORAL
  Filled 2016-05-14: qty 1

## 2016-05-14 MED ORDER — CLONIDINE HCL 0.1 MG PO TABS
0.1000 mg | ORAL_TABLET | Freq: Four times a day (QID) | ORAL | Status: DC
  Administered 2016-05-14 (×2): 0.1 mg via ORAL
  Filled 2016-05-14 (×2): qty 1

## 2016-05-14 MED ORDER — VITAMIN D (ERGOCALCIFEROL) 1.25 MG (50000 UNIT) PO CAPS
50000.0000 [IU] | ORAL_CAPSULE | ORAL | Status: DC
  Administered 2016-05-14: 50000 [IU] via ORAL
  Filled 2016-05-14: qty 1

## 2016-05-14 MED ORDER — VILAZODONE HCL 40 MG PO TABS
40.0000 mg | ORAL_TABLET | Freq: Every day | ORAL | Status: DC
  Administered 2016-05-14: 40 mg via ORAL
  Filled 2016-05-14: qty 1

## 2016-05-14 MED ORDER — SIMVASTATIN 20 MG PO TABS
20.0000 mg | ORAL_TABLET | Freq: Every day | ORAL | Status: DC
  Filled 2016-05-14: qty 1

## 2016-05-14 MED ORDER — NAPROXEN 500 MG PO TABS: 500.0000 mg | ORAL_TABLET | Freq: Two times a day (BID) | ORAL | Status: DC | PRN

## 2016-05-14 MED ORDER — LORAZEPAM 1 MG PO TABS: 1.0000 mg | ORAL_TABLET | Freq: Three times a day (TID) | ORAL | Status: DC | PRN

## 2016-05-14 MED ORDER — GABAPENTIN 400 MG PO CAPS
1200.0000 mg | ORAL_CAPSULE | Freq: Every day | ORAL | Status: DC
  Administered 2016-05-14: 1200 mg via ORAL
  Filled 2016-05-14: qty 3

## 2016-05-14 NOTE — BH Assessment (Signed)
[facility] Assessment Progress Note  Per Thedore MinsMojeed Akintayo, MD, this pt requires psychiatric hospitalization at this time.  At 15:19 Christiane HaJonathan calls from [facility] to report that pt has been accepted to their facility by Dr Wendall StadeKohl.  Nanine MeansJamison Lord, DNP concurs with this decision, as does the patient, who is currently under voluntary status.  Pt's nurse has been notified, and agrees to call report to [phone].  Pt is to be transported [facility].  Jaquis Picklesimer, MA Triage Specialist [phone]

## 2016-05-14 NOTE — ED Notes (Signed)
EKG performed. Tom in TTS notified. Copy placed in chart for Nanine MeansJamison Lord to review.

## 2016-05-14 NOTE — ED Notes (Signed)
Patient's daughter came for visit. Brought belongings from home and said she would take some belongings home with her to launder.

## 2016-05-14 NOTE — ED Notes (Signed)
[facility] was [facility] with all necessary paperwork. Report was called prior to departure.

## 2016-05-14 NOTE — BH Assessment (Signed)
[facility] Assessment Progress Note  Per Thedore MinsMojeed Akintayo, MD, this pt requires psychiatric hospitalization at this time.  The following facilities have been contacted to seek placement for this pt, with results as noted:  Beds available, information sent, decision pending:  [facility]   At capacity:  [facility]  The following providers are not able to provide detoxification for geriatric patients:  [facility]   Doylene Canninghomas Danner [place], [place] Triage Specialist [phone]

## 2016-05-14 NOTE — Consult Note (Signed)
[place] [facility] Consult   Reason for Consult:  Suicidal ideations with a plan, opiate deotx Referring Physician:  EDP Patient Identification: Kariann Wecker MRN:  [id number] Principal Diagnosis: Major depressive disorder, recurrent severe without psychotic features [facility]) Diagnosis:   Patient Active Problem List   Diagnosis Date Noted  . Major depressive disorder, recurrent severe without psychotic features ([place]) [F33.2] 05/14/2016    Priority: High  . Opiate dependence ([place]) [F11.20] 05/14/2016    Priority: High    Total Time spent with patient: 45 minutes  Subjective:   Noella Kipnis is a 76 y.o. female patient admitted with suicidal ideations and plan.  HPI:  On admission:  76 y.o. female who presents to the ED voluntarily. Pt reports she was living in [facility] but she came to [place] during Christmas to be with her children. Pt reports she was not able to go back to [facility] due to becoming ill and getting the flu while she was here. Pt reports she was living with her daughter while she was here but she is currently homeless because her daughter does not want her to stay with her any longer. Pt reports she lost her home in [facility] on her birthday which was [birth year] due to a foreclosure.   Pt reports she has been severely depressed since her husband passed away 6 years ago. Pt tearful throughout the assessment. Pt reports her depression began when her 40 year old daughter passed away in 97. Pt reports her son was killed 2 years later in a random act. Pt reports she continued to be depressed throughout her life but it worsened after her husband died 6 years ago. Pt reports she was married for 27 years. Pt was crying and stated "I had the best husband in the world." Pt reports she had 6 children with her husband.  Pt continued to state that she is "tired" and "wants to die." Pt denies an active plan but states she has been planning her death for some time and has been giving away her personal  belongings in the event she dies. Pt states she wakes up and thinks "maybe today is the day I will finally die." Pt states she recently got over the flu and she was hoping the flu would kill her.   Pt reports she has a psychiatrist that she sees in [facility] and she was taking Hydrocodone for many years. Pt reports due to not being able to get back to [facility] to get her prescription refilled, she has been going through opioid withdrawals. Pt reports multiple physical ailments including diarrhea, feeling naesuos, and fibromyalgia pain, pain in her hips, and numerous falls. Pt reports she is always in pain and does not want to be alive.  Today, patient continues to endorse hopelessness and worthless with suicidal ideations and no desire to live.  Plan to overdose.  She was planning to detox from opiates in February but would like to now.  Denies homicidal ideations, hallucinations, and alcohol abuse.  No withdrawal symptoms on assessment.  Past Psychiatric History: depression  Risk to Self: Suicidal Ideation: Yes-Currently Present Suicidal Intent: No Is patient at risk for suicide?: Yes Suicidal Plan?: No Access to Means: No What has been your use of drugs/alcohol within the last 12 months?: denies Triggers for Past Attempts: None known Intentional Self Injurious Behavior: None Risk to Others: Homicidal Ideation: No Thoughts of Harm to Others: No Current Homicidal Intent: No Current Homicidal Plan: No Access to Homicidal Means: No History of harm  to others?: No Assessment of Violence: None Noted Does patient have access to weapons?: Yes (Comment) (pt reports she has access to a gun ) Criminal Charges Pending?: No Does patient have a court date: No Prior Inpatient Therapy: Prior Inpatient Therapy: No Prior Outpatient Therapy: Prior Outpatient Therapy: Yes Prior Therapy Dates: current Prior Therapy Facilty/Provider(s): Dr. Irish Elders in [facility]  Reason for Treatment: Depression Does patient have an ACCT  team?: No Does patient have Intensive In-House Services?  : No Does patient have [facility] services? : No Does patient have P4CC services?: No  Past Medical History:  Past Medical History:  Diagnosis Date  . Anxiety   . Back pain   . Depression   . Fibromyalgia   . GERD (gastroesophageal reflux disease)   . Hypercholesteremia    History reviewed. No pertinent surgical history. Family History: No family history on file. Family Psychiatric  History: none Social History:  History  Alcohol Use  . Yes     History  Drug Use No    Social History   Social History  . Marital status: Married    Spouse name: N/A  . Number of children: N/A  . Years of education: N/A   Social History Main Topics  . Smoking status: Former Research scientist (life sciences)  . Smokeless tobacco: Never Used  . Alcohol use Yes  . Drug use: No  . Sexual activity: No   Other Topics Concern  . None   Social History Narrative  . None   Additional Social History:    Allergies:   Allergies  Allergen Reactions  . Tramadol Anaphylaxis    Oral cavity tremors  . Levofloxacin Other (See Comments)    Tendonitis  . Nsaids Rash  . Sulfa Antibiotics Rash    Labs:  Results for orders placed or performed during the hospital encounter of 05/13/16 (from the past 48 hour(s))  Comprehensive metabolic panel     Status: Abnormal   Collection Time: 05/13/16  6:05 PM  Result Value Ref Range   Sodium 142 135 - 145 mmol/L   Potassium 3.3 (L) 3.5 - 5.1 mmol/L   Chloride 107 101 - 111 mmol/L   CO2 26 22 - 32 mmol/L   Glucose, Bld 93 65 - 99 mg/dL   BUN 15 6 - 20 mg/dL   Creatinine, Ser 0.80 0.44 - 1.00 mg/dL   Calcium 8.7 (L) 8.9 - 10.3 mg/dL   Total Protein 6.4 (L) 6.5 - 8.1 g/dL   Albumin 3.4 (L) 3.5 - 5.0 g/dL   AST 22 15 - 41 U/L   ALT 20 14 - 54 U/L   Alkaline Phosphatase 78 38 - 126 U/L   Total Bilirubin 0.7 0.3 - 1.2 mg/dL   GFR calc non Af Amer >60 >60 mL/min   GFR calc Af Amer >60 >60 mL/min    Comment: (NOTE) The  eGFR has been calculated using the CKD EPI equation. This calculation has not been validated in all clinical situations. eGFR's persistently <60 mL/min signify possible Chronic Kidney Disease.    Anion gap 9 5 - 15  Ethanol     Status: None   Collection Time: 05/13/16  6:05 PM  Result Value Ref Range   Alcohol, Ethyl (B) <5 <5 mg/dL    Comment:        LOWEST DETECTABLE LIMIT FOR SERUM ALCOHOL IS 5 mg/dL FOR MEDICAL PURPOSES ONLY   Salicylate level     Status: None   Collection Time: 05/13/16  6:05 PM  Result Value Ref Range   Salicylate Lvl <2.0 2.8 - 30.0 mg/dL  Acetaminophen level     Status: Abnormal   Collection Time: 05/13/16  6:05 PM  Result Value Ref Range   Acetaminophen (Tylenol), Serum <10 (L) 10 - 30 ug/mL    Comment:        THERAPEUTIC CONCENTRATIONS VARY SIGNIFICANTLY. A RANGE OF 10-30 ug/mL MAY BE AN EFFECTIVE CONCENTRATION FOR MANY PATIENTS. HOWEVER, SOME ARE BEST TREATED AT CONCENTRATIONS OUTSIDE THIS RANGE. ACETAMINOPHEN CONCENTRATIONS >150 ug/mL AT 4 HOURS AFTER INGESTION AND >50 ug/mL AT 12 HOURS AFTER INGESTION ARE OFTEN ASSOCIATED WITH TOXIC REACTIONS.   cbc     Status: None   Collection Time: 05/13/16  6:05 PM  Result Value Ref Range   WBC 10.5 4.0 - 10.5 K/uL   RBC 4.39 3.87 - 5.11 MIL/uL   Hemoglobin 14.2 12.0 - 15.0 g/dL   HCT 41.5 36.0 - 46.0 %   MCV 94.5 78.0 - 100.0 fL   MCH 32.3 26.0 - 34.0 pg   MCHC 34.2 30.0 - 36.0 g/dL   RDW 13.8 11.5 - 15.5 %   Platelets 274 150 - 400 K/uL  Lipase, blood     Status: None   Collection Time: 05/13/16  6:05 PM  Result Value Ref Range   Lipase 19 11 - 51 U/L  Rapid urine drug screen (hospital performed)     Status: Abnormal   Collection Time: 05/13/16  8:07 PM  Result Value Ref Range   Opiates POSITIVE (A) NONE DETECTED   Cocaine NONE DETECTED NONE DETECTED   Benzodiazepines NONE DETECTED NONE DETECTED   Amphetamines NONE DETECTED NONE DETECTED   Tetrahydrocannabinol NONE DETECTED NONE DETECTED    Barbiturates NONE DETECTED NONE DETECTED    Comment:        DRUG SCREEN FOR MEDICAL PURPOSES ONLY.  IF CONFIRMATION IS NEEDED FOR ANY PURPOSE, NOTIFY LAB WITHIN 5 DAYS.        LOWEST DETECTABLE LIMITS FOR URINE DRUG SCREEN Drug Class       Cutoff (ng/mL) Amphetamine      1000 Barbiturate      200 Benzodiazepine   254 Tricyclics       270 Opiates          300 Cocaine          300 THC              50   Urinalysis, Routine w reflex microscopic     Status: None   Collection Time: 05/13/16  8:07 PM  Result Value Ref Range   Color, Urine YELLOW YELLOW   APPearance CLEAR CLEAR   Specific Gravity, Urine 1.017 1.005 - 1.030   pH 5.0 5.0 - 8.0   Glucose, UA NEGATIVE NEGATIVE mg/dL   Hgb urine dipstick NEGATIVE NEGATIVE   Bilirubin Urine NEGATIVE NEGATIVE   Ketones, ur NEGATIVE NEGATIVE mg/dL   Protein, ur NEGATIVE NEGATIVE mg/dL   Nitrite NEGATIVE NEGATIVE   Leukocytes, UA NEGATIVE NEGATIVE    Current Facility-Administered Medications  Medication Dose Route Frequency Provider Last Rate Last Dose  . acetaminophen (TYLENOL) tablet 650 mg  650 mg Oral Q4H PRN Forde Dandy, MD   650 mg at 05/14/16 1138  . albuterol (PROVENTIL) (2.5 MG/3ML) 0.083% nebulizer solution 3 mL  3 mL Inhalation Q6H PRN Patrecia Pour, NP      . amitriptyline (ELAVIL) tablet 25 mg  25 mg Oral QHS Forde Dandy, MD   25 mg  at 05/14/16 0155  . aspirin EC tablet 81 mg  81 mg Oral Daily Forde Dandy, MD   81 mg at 05/14/16 1139  . cloNIDine (CATAPRES) tablet 0.1 mg  0.1 mg Oral QID Corena Pilgrim, MD   0.1 mg at 05/14/16 1434   Followed by  . [START ON 05/16/2016] cloNIDine (CATAPRES) tablet 0.1 mg  0.1 mg Oral BH-qamhs Corena Pilgrim, MD       Followed by  . [START ON 05/18/2016] cloNIDine (CATAPRES) tablet 0.1 mg  0.1 mg Oral QAC breakfast Marx Doig, MD      . clopidogrel (PLAVIX) tablet 75 mg  75 mg Oral Daily Forde Dandy, MD   75 mg at 05/14/16 1138  . dicyclomine (BENTYL) tablet 20 mg  20 mg Oral Q6H  PRN Graison Leinberger, MD      . DULoxetine (CYMBALTA) DR capsule 30 mg  30 mg Oral Daily Patrecia Pour, NP   30 mg at 05/14/16 1434  . gabapentin (NEURONTIN) capsule 1,200 mg  1,200 mg Oral QHS Forde Dandy, MD   1,200 mg at 05/14/16 0155  . hydrOXYzine (ATARAX/VISTARIL) tablet 25 mg  25 mg Oral Q6H PRN Corena Pilgrim, MD      . loperamide (IMODIUM) capsule 4 mg  4 mg Oral Q6H PRN Forde Dandy, MD      . methocarbamol (ROBAXIN) tablet 500 mg  500 mg Oral Q8H PRN Corena Pilgrim, MD   500 mg at 05/14/16 1138  . metoprolol tartrate (LOPRESSOR) tablet 25 mg  25 mg Oral BID Forde Dandy, MD   25 mg at 05/14/16 1138  . ondansetron (ZOFRAN-ODT) disintegrating tablet 4 mg  4 mg Oral Q6H PRN Jalina Blowers, MD      . pantoprazole (PROTONIX) EC tablet 40 mg  40 mg Oral Daily Forde Dandy, MD   40 mg at 05/14/16 1138  . simvastatin (ZOCOR) tablet 20 mg  20 mg Oral q1800 Forde Dandy, MD      . Vitamin D (Ergocalciferol) (DRISDOL) capsule 50,000 Units  50,000 Units Oral Q14 Days Forde Dandy, MD   50,000 Units at 05/14/16 1138   Current Outpatient Prescriptions  Medication Sig Dispense Refill  . amitriptyline (ELAVIL) 25 MG tablet Take 25 mg by mouth at bedtime.    Marland Kitchen amphetamine-dextroamphetamine (ADDERALL) 10 MG tablet Take 20 mg by mouth 2 (two) times daily with a meal. Take 49m [facility]   . aspirin EC 81 MG tablet Take 81 mg by mouth daily.    .Marland Kitchenatenolol (TENORMIN) 25 MG tablet Take 25 mg by mouth 2 (two) times daily.    . Cholecalciferol (VITAMIN D3) 50000 units CAPS Take 50,000 Units by mouth every 14 (fourteen) days.     . clopidogrel (PLAVIX) 75 MG tablet Take 75 mg by mouth daily.    . diphenhydrAMINE (BENADRYL) 25 MG tablet Take 25 mg by mouth every 8 (eight) hours as needed for itching or allergies.    .Marland Kitchendoxycycline (VIBRAMYCIN) 100 MG capsule Take 1 capsule (100 mg total) by mouth 2 (two) times daily. 20 capsule 0  . gabapentin (NEURONTIN) 300 MG  capsule Take 1,200 mg by mouth at bedtime.     .Marland Kitchenloperamide (IMODIUM A-D) 2 MG tablet Take 8 mg by mouth 4 (four) times daily as needed for diarrhea or loose stools.     . Melatonin 10 MG TABS Take 10 mg by mouth  at bedtime.    . metoprolol tartrate (LOPRESSOR) 25 MG tablet Take 25 mg by mouth 2 (two) times daily.    . nitroGLYCERIN (NITROSTAT) 0.4 MG SL tablet Place 0.4 mg under the tongue every 5 (five) minutes as needed for chest pain.    Marland Kitchen omeprazole (PRILOSEC) 20 MG capsule Take 20 mg by mouth daily.    . simvastatin (ZOCOR) 20 MG tablet Take 20 mg by mouth daily at 6 PM.     . Vilazodone HCl (VIIBRYD) 40 MG TABS Take 40 mg by mouth daily.     Marland Kitchen HYDROcodone-acetaminophen (NORCO) 7.5-325 MG tablet Take 1 tablet by mouth every 6 (six) hours as needed for moderate pain.    Marland Kitchen HYDROcodone-homatropine (HYCODAN) 5-1.5 MG/5ML syrup Take 5 mLs by mouth every 4 (four) hours as needed for cough. (Patient not taking: Reported on 05/13/2016) 120 mL 0  . oseltamivir (TAMIFLU) 75 MG capsule Take 1 capsule (75 mg total) by mouth every 12 (twelve) hours. (Patient not taking: Reported on 05/13/2016) 10 capsule 0    Musculoskeletal: Strength & Muscle Tone: within normal limits Gait & Station: normal Patient leans: N/A  Psychiatric Specialty Exam: Physical Exam  ROS  Blood pressure 111/62, pulse 74, temperature 97.7 F (36.5 C), temperature source Oral, resp. rate 16, height _0  (1.626 m), weight 63.5 kg (140 lb), SpO2 94 %.Body mass index is 24.03 kg/m.  General Appearance: Disheveled  Eye Contact:  Fair  Speech:  Normal Rate  Volume:  Decreased  Mood:  Depressed  Affect:  Congruent  Thought Process:  Coherent and Descriptions of Associations: Intact  Orientation:  Full (Time, Place, and Person)  Thought Content:  Rumination  Suicidal Thoughts:  Yes.  with intent/plan  Homicidal Thoughts:  No  Memory:  Immediate;   Fair Recent;   Fair Remote;   Fair  Judgement:  Impaired  Insight:  Fair   Psychomotor Activity:  Decreased  Concentration:  Concentration: Fair and Attention Span: Fair  Recall:  [employer] of Knowledge:  Fair  Language:  Good  Akathisia:  No  Handed:  Right  AIMS (if indicated):     Assets:  Housing Leisure Time Physical Health Resilience Social Support  ADL's:  Intact  Cognition:  WNL  Sleep:        Treatment Plan Summary: Daily contact with patient to assess and evaluate symptoms and progress in treatment, Medication management and Plan major depressive disorder, recurrent, severe without psychosis:  -Crisis stabilization -Medication management:  Restarted medical medications except opiates and narcotics.  Clonidine Opiate Withdrawal Protocol started.  Amitriptyline 25 mg at bedtime for sleep continued Vistaril 25 mg every six hours PRN anxiety started Viibyrid discontinued and started Cymbalta 30 mg daily for depression. -Individual counseling -Transfer to [facility]  Disposition: Recommend psychiatric Inpatient admission when medically cleared.  Waylan Boga, NP 05/14/2016 4:15 PM  Patient seen face-to-face for psychiatric evaluation, chart reviewed and case discussed with the physician extender and developed treatment plan. Reviewed the information documented and agree with the treatment plan. Corena Pilgrim, MD

## 2016-05-14 NOTE — ED Notes (Signed)
Social worker at bedside.

## 2016-05-14 NOTE — Progress Notes (Addendum)
Pt confirms with ED CM that [place] [facility] [place] "six [place] and don't have a doctor" Pt appreciative of the offer from CM to provide a list of medicare providers within her zip code CM left pt a list of internal medicine, psychology and psychiatrist providers from medicare.gov within her listed zip code and placed these items in pt belonging bag in locker #28  Entered in d/c instructions Please use the resources provided to you in emergency room by case manager to assist in your choice of doctor for follow up     These [place] uninsured resources provide possible primary care providers, psychologists and psychiatrist within your [place] zip code    Next Steps: Schedule an appointment as soon as possible for a visit

## 2020-04-01 ENCOUNTER — Other Ambulatory Visit: Payer: Self-pay

## 2020-04-01 ENCOUNTER — Emergency Department (HOSPITAL_BASED_OUTPATIENT_CLINIC_OR_DEPARTMENT_OTHER): Payer: Medicare Other

## 2020-04-01 ENCOUNTER — Emergency Department (HOSPITAL_BASED_OUTPATIENT_CLINIC_OR_DEPARTMENT_OTHER)
Admission: EM | Admit: 2020-04-01 | Discharge: 2020-04-01 | Disposition: A | Discharge status: Home / Self Care | Payer: Medicare Other | Attending: Emergency Medicine | Admitting: Emergency Medicine

## 2020-04-01 ENCOUNTER — Encounter (HOSPITAL_BASED_OUTPATIENT_CLINIC_OR_DEPARTMENT_OTHER): Payer: Self-pay | Admitting: *Deleted

## 2020-04-01 DIAGNOSIS — K839 Disease of biliary tract, unspecified: Secondary | ICD-10-CM | POA: Insufficient documentation

## 2020-04-01 DIAGNOSIS — Z7982 Long term (current) use of aspirin: Secondary | ICD-10-CM | POA: Diagnosis not present

## 2020-04-01 DIAGNOSIS — K838 Other specified diseases of biliary tract: Secondary | ICD-10-CM

## 2020-04-01 DIAGNOSIS — Z79899 Other long term (current) drug therapy: Secondary | ICD-10-CM | POA: Insufficient documentation

## 2020-04-01 DIAGNOSIS — Z87891 Personal history of nicotine dependence: Secondary | ICD-10-CM | POA: Diagnosis not present

## 2020-04-01 DIAGNOSIS — R1031 Right lower quadrant pain: Secondary | ICD-10-CM

## 2020-04-01 DIAGNOSIS — R1011 Right upper quadrant pain: Secondary | ICD-10-CM

## 2020-04-01 DIAGNOSIS — K219 Gastro-esophageal reflux disease without esophagitis: Secondary | ICD-10-CM | POA: Insufficient documentation

## 2020-04-01 DIAGNOSIS — R109 Unspecified abdominal pain: Secondary | ICD-10-CM | POA: Diagnosis present

## 2020-04-01 LAB — CBC
HCT: 43.7 % (ref 36.0–46.0)
Hemoglobin: 14.2 g/dL (ref 12.0–15.0)
MCH: 32.3 pg (ref 26.0–34.0)
MCHC: 32.5 g/dL (ref 30.0–36.0)
MCV: 99.5 fL (ref 80.0–100.0)
Platelets: 217 10*3/uL (ref 150–400)
RBC: 4.39 MIL/uL (ref 3.87–5.11)
RDW: 13.6 % (ref 11.5–15.5)
WBC: 8.2 10*3/uL (ref 4.0–10.5)
nRBC: 0 % (ref 0.0–0.2)

## 2020-04-01 LAB — URINALYSIS, ROUTINE W REFLEX MICROSCOPIC
Bilirubin Urine: NEGATIVE
Glucose, UA: NEGATIVE mg/dL
Hgb urine dipstick: NEGATIVE
Ketones, ur: NEGATIVE mg/dL
Leukocytes,Ua: NEGATIVE
Nitrite: NEGATIVE
Protein, ur: NEGATIVE mg/dL
Specific Gravity, Urine: >1.03 — ABNORMAL HIGH (ref 1.005–1.030)
pH: 6 (ref 5.0–8.0)

## 2020-04-01 LAB — COMPREHENSIVE METABOLIC PANEL
ALT: 7 U/L (ref 0–44)
AST: 20 U/L (ref 15–41)
Albumin: 3.7 g/dL (ref 3.5–5.0)
Alkaline Phosphatase: 83 U/L (ref 38–126)
Anion gap: 10 (ref 5–15)
BUN: 12 mg/dL (ref 8–23)
CO2: 25 mmol/L (ref 22–32)
Calcium: 9.3 mg/dL (ref 8.9–10.3)
Chloride: 103 mmol/L (ref 98–111)
Creatinine, Ser: 0.93 mg/dL (ref 0.44–1.00)
GFR, Estimated: >60 mL/min (ref >60)
Glucose, Bld: 115 mg/dL — ABNORMAL HIGH (ref 70–99)
Potassium: 4 mmol/L (ref 3.5–5.1)
Sodium: 138 mmol/L (ref 135–145)
Total Bilirubin: 0.4 mg/dL (ref 0.3–1.2)
Total Protein: 6.6 g/dL (ref 6.5–8.1)

## 2020-04-01 LAB — LIPASE, BLOOD: Lipase: 26 U/L (ref 11–51)

## 2020-04-01 MED ORDER — IOHEXOL 300 MG/ML  SOLN
100.0000 mL | Freq: Once | INTRAMUSCULAR | Status: AC | PRN
  Administered 2020-04-01: 100 mL via INTRAVENOUS

## 2020-04-01 MED ORDER — FENTANYL CITRATE (PF) 100 MCG/2ML IJ SOLN
50.0000 ug | Freq: Once | INTRAMUSCULAR | Status: AC
Start: 2020-04-01 — End: 2020-04-01
  Administered 2020-04-01: 50 ug via INTRAVENOUS
  Filled 2020-04-01: qty 2

## 2020-04-01 MED ORDER — ONDANSETRON 4 MG PO TBDP: 4.0000 mg | ORAL_TABLET | Freq: Three times a day (TID) | ORAL | 0 refills | Status: DC | PRN

## 2020-04-01 NOTE — ED Provider Notes (Signed)
[facility] EMERGENCY DEPARTMENT Provider Note   CSN: [id number] Arrival date & time: 04/01/20  1635     History Chief Complaint  Patient presents with  . Abdominal Pain    Laura Fritz is a 79 y.o. female. Presents to ER with concern for abdominal pain. Pain for about the last 5 days. Worse in her right lower quadrant. States pain worse in the morning, resolves after taking Tylenol and tramadol. Today pain is more persistent, unrelenting. Moderate. Occasional mild nausea but none currently. Denies any abdominal surgical history. No diarrhea or constipation. No fever.  HPI     Past Medical History:  Diagnosis Date  . Anxiety   . Back pain   . Depression   . Fibromyalgia   . GERD (gastroesophageal reflux disease)   . Hypercholesteremia     Patient Active Problem List   Diagnosis Date Noted  . Major depressive disorder, 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  . Opiate dependence ([facility]) 05/14/2016    History reviewed. No pertinent surgical history.   OB History   No obstetric history on file.     No family history on file.  Social History   Tobacco Use  . Smoking status: Former Games developer  . Smokeless tobacco: Never Used  Substance Use Topics  . Alcohol use: Yes  . Drug use: No    Home Medications Prior to Admission medications   Medication Sig Start Date End Date Taking? Authorizing Provider  Cholecalciferol (VITAMIN D3) 50000 units CAPS Take 50,000 Units by mouth every 14 (fourteen) days.    Yes [provider]  clopidogrel (PLAVIX) 75 MG tablet Take 75 mg by mouth daily.   Yes [provider]  gabapentin (NEURONTIN) 300 MG capsule Take 1,200 mg by mouth at bedtime.    Yes [provider]  loperamide (IMODIUM A-D) 2 MG tablet Take 8 mg by mouth 4 (four) times daily as needed for diarrhea or loose stools.    Yes [provider]  amitriptyline (ELAVIL) 25 MG tablet Take 25 mg by mouth at  bedtime.    [provider]  amphetamine-dextroamphetamine (ADDERALL) 10 MG tablet Take 20 mg by mouth 2 (two) times daily with a meal. Take 20mg  twice daily with meals in the morning and at [facility]    [provider]  aspirin EC 81 MG tablet Take 81 mg by mouth daily.    [provider]  atenolol (TENORMIN) 25 MG tablet Take 25 mg by mouth 2 (two) times daily.    [provider]  diphenhydrAMINE (BENADRYL) 25 MG tablet Take 25 mg by mouth every 8 (eight) hours as needed for itching or allergies.    [provider]  doxycycline (VIBRAMYCIN) 100 MG capsule Take 1 capsule (100 mg total) by mouth 2 (two) times daily. 05/07/16   05/09/16, MD  HYDROcodone-acetaminophen (NORCO) 7.5-325 MG tablet Take 1 tablet by mouth every 6 (six) hours as needed for moderate pain.    [provider]  HYDROcodone-homatropine (HYCODAN) 5-1.5 MG/5ML syrup Take 5 mLs by mouth every 4 (four) hours as needed for cough. Patient not taking: No sig reported 05/07/16   05/09/16, MD  Melatonin 10 MG TABS Take 10 mg by mouth at bedtime.    [provider]  metoprolol tartrate (LOPRESSOR) 25 MG tablet Take 25 mg by mouth 2 (two) times daily.    [provider]  nitroGLYCERIN (NITROSTAT) 0.4 MG SL tablet Place 0.4 mg under the tongue every  5 (five) minutes as needed for chest pain.    [provider]  omeprazole (PRILOSEC) 20 MG capsule Take 20 mg by mouth daily.    [provider]  oseltamivir (TAMIFLU) 75 MG capsule Take 1 capsule (75 mg total) by mouth every 12 (twelve) hours. Patient not taking: No sig reported 05/07/16   Shaune Pollack, MD  simvastatin (ZOCOR) 20 MG tablet Take 20 mg by mouth daily at 6 PM.     [provider]  Vilazodone HCl (VIIBRYD) 40 MG TABS Take 40 mg by mouth daily.     [provider]    Allergies    Tramadol, Levofloxacin, Nsaids, and Sulfa antibiotics  Review of Systems   Review of  Systems  Constitutional: Negative for chills and fever.  HENT: Negative for ear pain and sore throat.   Eyes: Negative for pain and visual disturbance.  Respiratory: Negative for cough and shortness of breath.   Cardiovascular: Negative for chest pain and palpitations.  Gastrointestinal: Positive for abdominal pain. Negative for vomiting.  Genitourinary: Negative for dysuria and hematuria.  Musculoskeletal: Negative for arthralgias and back pain.  Skin: Negative for color change and rash.  Neurological: Negative for seizures and syncope.  All other systems reviewed and are negative.   Physical Exam Updated Vital Signs BP 131/68 (BP Location: Right Arm)   Pulse 70   Temp 98.2 F (36.8 C) (Oral)   Resp 20   Ht 5\' 4"  (1.626 m)   Wt 77.1 kg   SpO2 96%   BMI 29.18 kg/m   Physical Exam Vitals and nursing note reviewed.  Constitutional:      General: She is not in acute distress.    Appearance: She is well-developed and well-nourished.  HENT:     Head: Normocephalic and atraumatic.  Eyes:     Conjunctiva/sclera: Conjunctivae normal.  Cardiovascular:     Rate and Rhythm: Normal rate and regular rhythm.     Heart sounds: No murmur heard.   Pulmonary:     Effort: Pulmonary effort is normal. No respiratory distress.     Breath sounds: Normal breath sounds.  Abdominal:     Palpations: Abdomen is soft.     Tenderness: There is abdominal tenderness in the right lower quadrant. There is no guarding. Positive signs include McBurney's sign.  Musculoskeletal:        General: No edema.     Cervical back: Neck supple.  Skin:    General: Skin is warm and dry.  Neurological:     Mental Status: She is alert.  Psychiatric:        Mood and Affect: Mood and affect normal.     ED Results / Procedures / Treatments   Labs (all labs ordered are listed, but only abnormal results are displayed) Labs Reviewed  COMPREHENSIVE METABOLIC PANEL - Abnormal; Notable for the following  components:      Result Value   Glucose, Bld 115 (*)    All other components within normal limits  URINALYSIS, ROUTINE W REFLEX MICROSCOPIC - Abnormal; Notable for the following components:   APPearance HAZY (*)    Specific Gravity, Urine >1.030 (*)    All other components within normal limits  LIPASE, BLOOD  CBC    EKG None  Radiology No results found.  Procedures Procedures (including critical care time)  Medications Ordered in ED Medications  fentaNYL (SUBLIMAZE) injection 50 mcg (50 mcg Intravenous Given 04/01/20 1823)    ED Course  I have reviewed  the triage vital signs and the nursing notes.  Pertinent labs & imaging results that were available during my care of the patient were reviewed by me and considered in my medical decision making (see chart for details).    MDM Rules/Calculators/A&P                         79 year old lady presents to ER with concern for right lower quadrant pain.  On exam, patient noted to be well-appearing with normal vital signs.  On belly exam, had some tenderness in the right lower quadrant but no other tenderness and abdomen was soft without rebound or guarding.  Lab work was all within normal limits including CBC, CMP.  CT abdomen pelvis was negative for any acute pathology.  Radiologist commented on intrahepatic and extrahepatic biliary dilatation.  Ordered ultrasound of the right upper quadrant to further evaluate.  The ultrasound demonstrated common bile duct dilatation but no other notable findings.  The radiologist recommended if laboratory results are normal no further evaluation is warranted.  Patient has completely normal LFTs including normal bilirubin levels and on repeat exam, she again had no pain in the right upper quadrant, therefore I have a very low clinical, lab suspicion for acute biliary or hepatic process.  She remained well-appearing and pain is well controlled.  Recommend she follow-up with her primary doctor as well as a  gastroenterologist.  Reviewed return precautions and discharged home.   After the discussed management above, the patient was determined to be safe for discharge.  The patient was in agreement with this plan and all questions regarding their care were answered.  ED return precautions were discussed and the patient will return to the ED with any significant worsening of condition.  Final Clinical Impression(s) / ED Diagnoses Final diagnoses:  RLQ abdominal pain  Common bile duct dilatation    Rx / DC Orders ED Discharge Orders    None       Milagros Loll, MD 04/01/20 2140

## 2020-04-01 NOTE — Discharge Instructions (Addendum)
Recommend following up both with your primary care doctor as well as a gastroenterologist to discuss your symptoms as well as the findings on the common bile duct dilation on your ultrasound.  If you have worsening pain, vomiting, fever, you should return immediately to the emergency room for reassessment.  Recommend taking daily MiraLAX for bowel regimen.  Take Tylenol for pain control or your previously prescribed tramadol.  Can also take Zofran which can help with nausea or pain.

## 2020-04-01 NOTE — ED Triage Notes (Signed)
Right lower quadrant pain for a week 

## 2020-04-01 NOTE — ED Notes (Signed)
Pt daughter made aware that patient was in a room reports she will be to [facility] around 7 pm.

## 2020-04-01 NOTE — ED Notes (Signed)
ED Provider at bedside. 

## 2020-04-25 ENCOUNTER — Encounter (HOSPITAL_COMMUNITY): Payer: Self-pay

## 2020-04-25 ENCOUNTER — Emergency Department (HOSPITAL_COMMUNITY)
Admission: EM | Admit: 2020-04-25 | Discharge: 2020-04-25 | Disposition: A | Discharge status: Home / Self Care | Payer: Medicare Other | Attending: Emergency Medicine | Admitting: Emergency Medicine

## 2020-04-25 DIAGNOSIS — Z5321 Procedure and treatment not carried out due to patient leaving prior to being seen by health care provider: Secondary | ICD-10-CM | POA: Insufficient documentation

## 2020-04-25 DIAGNOSIS — R55 Syncope and collapse: Secondary | ICD-10-CM | POA: Diagnosis not present

## 2020-04-25 LAB — BASIC METABOLIC PANEL
Anion gap: 10 (ref 5–15)
BUN: 13 mg/dL (ref 8–23)
CO2: 26 mmol/L (ref 22–32)
Calcium: 9.3 mg/dL (ref 8.9–10.3)
Chloride: 107 mmol/L (ref 98–111)
Creatinine, Ser: 1.02 mg/dL — ABNORMAL HIGH (ref 0.44–1.00)
GFR, Estimated: 57 mL/min — ABNORMAL LOW (ref >60)
Glucose, Bld: 104 mg/dL — ABNORMAL HIGH (ref 70–99)
Potassium: 4.4 mmol/L (ref 3.5–5.1)
Sodium: 143 mmol/L (ref 135–145)

## 2020-04-25 LAB — CBC
HCT: 42.5 % (ref 36.0–46.0)
Hemoglobin: 13.8 g/dL (ref 12.0–15.0)
MCH: 32.2 pg (ref 26.0–34.0)
MCHC: 32.5 g/dL (ref 30.0–36.0)
MCV: 99.3 fL (ref 80.0–100.0)
Platelets: 215 10*3/uL (ref 150–400)
RBC: 4.28 MIL/uL (ref 3.87–5.11)
RDW: 13.8 % (ref 11.5–15.5)
WBC: 7.9 10*3/uL (ref 4.0–10.5)
nRBC: 0 % (ref 0.0–0.2)

## 2020-04-25 LAB — CBG MONITORING, ED: Glucose-Capillary: 95 mg/dL (ref 70–99)

## 2020-04-25 NOTE — ED Notes (Signed)
Pt reported that she was leaving. IV that was placed by EMS removed.

## 2020-04-25 NOTE — ED Triage Notes (Signed)
Pt presents with c/o near syncope episode that occurred while she was making breakfast this morning. Pt did not have a complete LOC, was able to assist herself into the floor. Pt was orthostatic per EMS, initial BP of 118/50, dropped to 74/40.

## 2020-05-12 ENCOUNTER — Other Ambulatory Visit: Payer: Self-pay | Admitting: Family

## 2020-05-12 DIAGNOSIS — M25511 Pain in right shoulder: Secondary | ICD-10-CM

## 2020-05-13 ENCOUNTER — Ambulatory Visit: Payer: Medicare Other | Admitting: Nurse Practitioner

## 2020-05-13 ENCOUNTER — Other Ambulatory Visit: Payer: Self-pay | Admitting: Family

## 2020-05-13 ENCOUNTER — Other Ambulatory Visit: Payer: Self-pay | Admitting: *Deleted

## 2020-05-13 DIAGNOSIS — M25511 Pain in right shoulder: Secondary | ICD-10-CM

## 2020-05-13 NOTE — Progress Notes (Deleted)
05/13/2020 Laura Fritz [id number] 1940-04-27   CHIEF COMPLAINT:   HISTORY OF PRESENT ILLNESS: Laura Fritz is a 80 year old female with a past medical history of anxiety, depression, suicidal ideations 2018, fibromyalgia, hypercholesterolemia, GERD  Plavix, NTG  She presented to [facility] ED on 04/01/2020 with RLQ abdominal pain  CBC Latest Ref Rng & Units 04/25/2020 04/01/2020 05/13/2016  WBC 4.0 - 10.5 K/uL 7.9 8.2 10.5  Hemoglobin 12.0 - 15.0 g/dL 29.9 24.2 68.3  Hematocrit 36.0 - 46.0 % 42.5 43.7 41.5  Platelets 150 - 400 K/uL 215 217 274    CMP Latest Ref Rng & Units 04/25/2020 04/01/2020 05/13/2016  Glucose 70 - 99 mg/dL 419(Q) 222(L) 93  BUN 8 - 23 mg/dL 13 12 15   Creatinine 0.44 - 1.00 mg/dL ) 7.98(X 2.11  Sodium 135 - 145 mmol/L 143 138 142  Potassium 3.5 - 5.1 mmol/L 4.4 4.0 3.3(L)  Chloride 98 - 111 mmol/L 107 103 107  CO2 22 - 32 mmol/L 26 25 26   Calcium 8.9 - 10.3 mg/dL 9.3 9.3 9.41)  Total Protein 6.5 - 8.1 g/dL - 6.6 )  Total Bilirubin 0.3 - 1.2 mg/dL - 0.4 0.7  Alkaline Phos 38 - 126 U/L - 83 78  AST 15 - 41 U/L - 20 22  ALT 0 - 44 U/L - 7 20   RUQ abdominal sonogram:  1. Common bile duct dilatation in the absence of prior cholecystectomy. If laboratory results are normal, no further evaluation is warranted, however, if lab results are abnormal, further correlation with MRCP is recommended. 2. Subcentimeter hepatic cyst.  Abdominal/pelvic CT with contrast 04/01/2020: 1. Aortic atherosclerosis. 2. Moderate intrahepatic and extrahepatic biliary dilatation is noted. Correlation with liver function tests is recommended to rule out obstruction. MRCP may be performed for further evaluation as well. 3. No other abnormality seen in the abdomen or pelvis. Aortic Atherosclerosis   Past Medical History:  Diagnosis Date  . Anxiety   . Back pain   . Depression   . Fibromyalgia   . GERD (gastroesophageal reflux disease)   .  Hypercholesteremia    Social History:  Family History:   No past surgical history on file.  reports that she has quit smoking. She has never used smokeless tobacco. She reports current alcohol use. She reports that she does not use drugs. family history is not on file. Allergies  Allergen Reactions  . Tramadol Anaphylaxis    Oral cavity tremors  . Levofloxacin Other (See Comments)    Tendonitis  . Nsaids Rash  . Sulfa Antibiotics Rash      Outpatient Encounter Medications as of 05/13/2020  Medication Sig  . amitriptyline (ELAVIL) 25 MG tablet Take 25 mg by mouth at bedtime.  14/01/2020 amphetamine-dextroamphetamine (ADDERALL) 10 MG tablet Take 20 mg by mouth 2 (two) times daily with a meal. Take 20mg  twice daily with meals in the morning and at [facility]  . aspirin EC 81 MG tablet Take 81 mg by mouth daily.  Marland Kitchen atenolol (TENORMIN) 25 MG tablet Take 25 mg by mouth 2 (two) times daily.  . Cholecalciferol (VITAMIN D3) 50000 units CAPS Take 50,000 Units by mouth every 14 (fourteen) days.   . clopidogrel (PLAVIX) 75 MG tablet Take 75 mg by mouth daily.  . diphenhydrAMINE (BENADRYL) 25 MG tablet Take 25 mg by mouth every 8 (eight) hours as needed for itching or allergies.  doxycycline (VIBRAMYCIN) 100 MG capsule Take 1 capsule (100 mg total) by  mouth 2 (two) times daily.  Marland Kitchen gabapentin (NEURONTIN) 300 MG capsule Take 1,200 mg by mouth at bedtime.   Marland Kitchen HYDROcodone-acetaminophen (NORCO) 7.5-325 MG tablet Take 1 tablet by mouth every 6 (six) hours as needed for moderate pain.  Marland Kitchen HYDROcodone-homatropine (HYCODAN) 5-1.5 MG/5ML syrup Take 5 mLs by mouth every 4 (four) hours as needed for cough. (Patient not taking: No sig reported)  . loperamide (IMODIUM A-D) 2 MG tablet Take 8 mg by mouth 4 (four) times daily as needed for diarrhea or loose stools.   . Melatonin 10 MG TABS Take 10 mg by mouth at bedtime.  . metoprolol tartrate (LOPRESSOR) 25 MG tablet Take 25 mg by mouth 2 (two) times daily.  .  nitroGLYCERIN (NITROSTAT) 0.4 MG SL tablet Place 0.4 mg under the tongue every 5 (five) minutes as needed for chest pain.  Marland Kitchen omeprazole (PRILOSEC) 20 MG capsule Take 20 mg by mouth daily.  . ondansetron (ZOFRAN ODT) 4 MG disintegrating tablet Take 1 tablet (4 mg total) by mouth every 8 (eight) hours as needed for nausea or vomiting.  Marland Kitchen oseltamivir (TAMIFLU) 75 MG capsule Take 1 capsule (75 mg total) by mouth every 12 (twelve) hours. (Patient not taking: No sig reported)  . simvastatin (ZOCOR) 20 MG tablet Take 20 mg by mouth daily at 6 PM.   . Vilazodone HCl (VIIBRYD) 40 MG TABS Take 40 mg by mouth daily.    No facility-administered encounter medications on file as of 05/13/2020.     REVIEW OF SYSTEMS:  Gen: Denies fever, sweats or chills. No weight loss.  CV: Denies chest pain, palpitations or edema. Resp: Denies cough, shortness of breath of hemoptysis.  GI: Denies heartburn, dysphagia, stomach or lower abdominal pain. No diarrhea or constipation.  GU : Denies urinary burning, blood in urine, increased urinary frequency or incontinence. MS: Denies joint pain, muscles aches or weakness. Derm: Denies rash, itchiness, skin lesions or unhealing ulcers. Psych: Denies depression, anxiety, memory loss, suicidal ideation and confusion. Heme: Denies bruising, bleeding. Neuro:  Denies headaches, dizziness or paresthesias. Endo:  Denies any problems with DM, thyroid or adrenal function.    PHYSICAL EXAM: There were no vitals taken for this visit. General: Well developed ... in no acute distress. Head: Normocephalic and atraumatic. Eyes:  Sclerae non-icteric, conjunctive pink. Ears: Normal auditory acuity. Mouth: Dentition intact. No ulcers or lesions.  Neck: Supple, no lymphadenopathy or thyromegaly.  Lungs: Clear bilaterally to auscultation without wheezes, crackles or rhonchi. Heart: Regular rate and rhythm. No murmur, rub or gallop appreciated.  Abdomen: Soft, nontender, non distended.  No masses. No hepatosplenomegaly. Normoactive bowel sounds x 4 quadrants.  Rectal:  Musculoskeletal: Symmetrical with no gross deformities. Skin: Warm and dry. No rash or lesions on visible extremities. Extremities: No edema. Neurological: Alert oriented x 4, no focal deficits.  Psychological:  Alert and cooperative. Normal mood and affect.  ASSESSMENT AND PLAN:    CC:  No ref. provider found

## 2020-05-25 ENCOUNTER — Other Ambulatory Visit: Payer: Medicare Other

## 2020-08-19 ENCOUNTER — Encounter: Payer: Self-pay | Admitting: Gastroenterology

## 2020-09-07 ENCOUNTER — Ambulatory Visit (INDEPENDENT_AMBULATORY_CARE_PROVIDER_SITE_OTHER): Payer: Medicare Other | Admitting: Gastroenterology

## 2020-09-07 ENCOUNTER — Encounter: Payer: Self-pay | Admitting: Gastroenterology

## 2020-09-07 VITALS — BP 110/70 | HR 120 | Ht 64.0 in | Wt 161.2 lb

## 2020-09-07 DIAGNOSIS — G8929 Other chronic pain: Secondary | ICD-10-CM | POA: Diagnosis not present

## 2020-09-07 DIAGNOSIS — K529 Noninfective gastroenteritis and colitis, unspecified: Secondary | ICD-10-CM | POA: Diagnosis not present

## 2020-09-07 DIAGNOSIS — R1031 Right lower quadrant pain: Secondary | ICD-10-CM | POA: Diagnosis not present

## 2020-09-07 NOTE — Progress Notes (Addendum)
09/07/2020 Laura Fritz [id number] 18-Sep-1940   HISTORY OF PRESENT ILLNESS: This is a 80 year old female who is new to our office.  She has past medical history of depression, anxiety, fibromyalgia, Parkinson's disease, remote history of MI on Plavix, GERD.  She is here today with complaints of right lower quadrant abdominal pain.  She tells me that the pain is located just in one area and has been intermittent for the past several months.  She says that it is sharp in nature when it occurs.  Seems positional/more so with certain movements.  She says that it has been limiting her daily life at times when it is present.  She denies any nausea or vomiting.  She says that she thinks that it is scar tissue/adhesions.  She tells me that when she had a previous cystocele and rectocele repair years ago she was told that she has scar tissue.  She admits to chronic diarrhea for 10 years or more.  She takes 2 Imodium every day and that seems to manage it for the most part.  On occasion she has to take a third Imodium if needed.  She tells me that she has had multiple colonoscopies, last one about 2 or 3 years ago, and they have all been clean/unremarkable.  She denies seeing any blood in her stools.  She tells me that she does not want another colonoscopy.  CT scan of the abdomen and pelvis with contrast in December 2021 for complaints of right lower quadrant abdominal pain showed aortic atherosclerosis and moderate intrahepatic extrahepatic biliary dilatation.  Ultrasound the same day showed common bile duct dilatation at 5.8 mm in the absence of prior cholecystectomy.  The report stated if LFT results are abnormal then MRCP would be recommended, but if they are normal then no further evaluation is warranted.  LFTs have in fact been normal.  CBC normal, lipase normal, urinalysis normal/negative.  While she is here today she tells me how she lost a daughter at 26 years old due to a blood condition.  Then her  son was murdered and when he was in his 31s.  She lost her husband 4 years ago.  She recently relocated here from [place], [place].  She admits to being depressed and is tearful.   Past Medical History:  Diagnosis Date  . Anxiety   . Back pain   . Depression   . Fibromyalgia   . GERD (gastroesophageal reflux disease)   . Graves disease   . Hypercholesteremia   . Myocardial infarct (HCC)   . Parkinson disease [facility])    Past Surgical History:  Procedure Laterality Date  . ABDOMINAL HYSTERECTOMY    . ANTERIOR (CYSTOCELE) AND POSTERIOR REPAIR (RECTOCELE) WITH XENFORM GRAFT AND SACROSPINOUS FIXATION      reports that she has quit smoking. She has never used smokeless tobacco. She reports current alcohol use. She reports that she does not use drugs. family history includes Esophageal cancer in her cousin and maternal grandfather; Lung cancer in her mother. Allergies  Allergen Reactions  . Tramadol Anaphylaxis    Oral cavity tremors  . Levofloxacin Other (See Comments)    Tendonitis  . Nsaids Rash  . Sulfa Antibiotics Rash      Outpatient Encounter Medications as of 09/07/2020  Medication Sig  . buPROPion (WELLBUTRIN XL) 150 MG 24 hr tablet Take 1 tablet by mouth daily.  . Cholecalciferol (VITAMIN D3) 50000 units CAPS Take 50,000 Units by mouth every 14 (fourteen)  days.   . clopidogrel (PLAVIX) 75 MG tablet Take 75 mg by mouth daily.  . diphenhydrAMINE (BENADRYL) 25 MG tablet Take 25 mg by mouth every 8 (eight) hours as needed for itching or allergies.  Marland Kitchen gabapentin (NEURONTIN) 300 MG capsule Take 900 mg by mouth at bedtime.  Marland Kitchen levothyroxine (SYNTHROID) 75 MCG tablet Take 1 tablet by mouth daily.  Marland Kitchen loperamide (IMODIUM A-D) 2 MG tablet Take 4 mg by mouth at bedtime.  . nitroGLYCERIN (NITROSTAT) 0.4 MG SL tablet Place 0.4 mg under the tongue every 5 (five) minutes as needed for chest pain.  Marland Kitchen omeprazole (PRILOSEC) 20 MG capsule Take 20 mg by mouth daily.  . ondansetron  (ZOFRAN ODT) 4 MG disintegrating tablet Take 1 tablet (4 mg total) by mouth every 8 (eight) hours as needed for nausea or vomiting.  . simvastatin (ZOCOR) 20 MG tablet Take 20 mg by mouth daily at 6 PM.   . traZODone (DESYREL) 50 MG tablet Take 1-2 tablets by mouth at bedtime as needed.  . [DISCONTINUED] amitriptyline (ELAVIL) 25 MG tablet Take 25 mg by mouth at bedtime.  . [DISCONTINUED] amphetamine-dextroamphetamine (ADDERALL) 10 MG tablet Take 20 mg by mouth 2 (two) times daily with a meal. Take 20mg  twice daily with meals in the morning and at [facility]  . [DISCONTINUED] aspirin EC 81 MG tablet Take 81 mg by mouth daily.  . [DISCONTINUED] atenolol (TENORMIN) 25 MG tablet Take 25 mg by mouth 2 (two) times daily.  . [DISCONTINUED] doxycycline (VIBRAMYCIN) 100 MG capsule Take 1 capsule (100 mg total) by mouth 2 (two) times daily.  . [DISCONTINUED] HYDROcodone-acetaminophen (NORCO) 7.5-325 MG tablet Take 1 tablet by mouth every 6 (six) hours as needed for moderate pain.  . [DISCONTINUED] HYDROcodone-homatropine (HYCODAN) 5-1.5 MG/5ML syrup Take 5 mLs by mouth every 4 (four) hours as needed for cough. (Patient not taking: No sig reported)  . [DISCONTINUED] Melatonin 10 MG TABS Take 10 mg by mouth at bedtime.  . [DISCONTINUED] metoprolol tartrate (LOPRESSOR) 25 MG tablet Take 25 mg by mouth 2 (two) times daily.  . [DISCONTINUED] oseltamivir (TAMIFLU) 75 MG capsule Take 1 capsule (75 mg total) by mouth every 12 (twelve) hours. (Patient not taking: No sig reported)  . [DISCONTINUED] Vilazodone HCl (VIIBRYD) 40 MG TABS Take 40 mg by mouth daily.    No facility-administered encounter medications on file as of 09/07/2020.     REVIEW OF SYSTEMS  : All other systems reviewed and negative except where noted in the History of Present Illness.   PHYSICAL EXAM: BP 110/70   Pulse (!) 120   Ht 5\' 4"  (1.626 m)   Wt 161 lb 3.2 oz (73.1 kg)   BMI 27.67 kg/m  General: Well developed white female in no acute  distress Head: Normocephalic and atraumatic Eyes:  Sclerae anicteric, conjunctiva pink. Ears: Normal auditory acuity Lungs: Clear throughout to auscultation; no W/R/R. Heart: Tachy; no M/R/G. Abdomen:  Non-distended.  BS present.  Pinpoint tenderness just to the right a below the umbilicus. Musculoskeletal: Symmetrical with no gross deformities  Skin: No lesions on visible extremities Extremities: No edema  Neurological: Alert oriented x 4, grossly non-focal Psychological:  Alert and cooperative. Normal mood and affect  ASSESSMENT AND PLAN: *Right lower quadrant abdominal pain: Has been present in the past several months.  It is sharp in nature and seems positional,/worse with certain movements.  She reports a history of scar tissue when she had previous surgery years ago.  She wonders if that is  the issue.  She just had a CT scan that did not show a cause of her pain.  She has had multiple colonoscopies, the last one just a couple of years ago.  We discussed treating this with medication, possibly amitriptyline or something similar, but she would like to discuss with the surgeon as well.  We will put in a referral to [facility]. *Chronic diarrhea: She reports that she has had this issue for 10 years or more.  She has had several colonoscopies that she tells me have all been normal/clean.  Last colonoscopy couple of years ago.  We will try to request records.  She manages this with taking a couple of Imodium every day.  **Addendum: Records received from [facility], [place].  EGD October 2016 was normal.  Celiac labs from February 2016 were negative.  Colonoscopy February 2016 showed normal-appearing colon mucosa with redundant sigmoid colon and diverticulosis in the sigmoid colon.  Also had external hemorrhoids.  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  Colonoscopy  October 2011 showed sigmoid diverticulosis and external hemorrhoids.  CC:  No ref. provider found

## 2020-09-07 NOTE — Progress Notes (Signed)
Attending Physician's Attestation   I have reviewed the chart.   I agree with the Advanced Practitioner's note, impression, and recommendations with any updates as below. Okay for surgical referral.  We will see what prior pathology has been done to ensure she has not had recent biopsies to rule out microscopic/collagenous colitis with understanding that biopsies can be negative previously but found later on in life if we are going to further work-up her chronic diarrhea.  May consider endoscopic evaluation as well.  Query EPI and SIBO evaluation in future.   Corliss Parish, MD  Gastroenterology Advanced Endoscopy Office # [phone]

## 2020-09-07 NOTE — Patient Instructions (Signed)
If you are age 78 or older, your body mass index should be between 23-30. Your Body mass index is 27.67 kg/m. If this is out of the aforementioned range listed, please consider follow up with your Primary Care Provider.  If you are age 33 or younger, your body mass index should be between 19-25. Your Body mass index is 27.67 kg/m. If this is out of the aformentioned range listed, please consider follow up with your Primary Care Provider.   We will send a referral to [facility] and they will call you with an appointment.  Thank you for choosing me and [facility].  Doug Sou, PA-C

## 2020-09-22 ENCOUNTER — Telehealth: Payer: Self-pay

## 2020-09-22 NOTE — Telephone Encounter (Signed)
-----   Message from Leta Baptist, PA-C sent at 09/22/2020  1:31 PM EDT ----- I saw this patient on May 18.  Please let her know that I received the records from her previous colonoscopies and endoscopies from [facility].  She was reporting longstanding complaints of diarrhea.  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  There is medication that we can try for that.  Not sure she recognizes that term or recognizes the medication budesonide that she may potentially have been put on in the past.  If she is continuing to have a lot of diarrhea then lets try to get Uceris/budesonide 9 mg for her to take daily for a couple of months.  This is a steroid derivative and helps with microscopic inflammation, but should not have near the side effects that prednisone steroids would.  Let me know what she says.  Thank you,  Jess

## 2020-09-23 MED ORDER — BUDESONIDE 3 MG PO CPEP: 9.0000 mg | ORAL_CAPSULE | Freq: Every day | ORAL | 2 refills | Status: AC

## 2020-09-23 NOTE — Telephone Encounter (Signed)
The pt has been advised and agrees to try budesonide.  Prescription sent [facility] as requested.

## 2020-10-06 ENCOUNTER — Encounter: Payer: Self-pay | Admitting: Neurology

## 2021-03-26 ENCOUNTER — Encounter (HOSPITAL_COMMUNITY): Payer: Self-pay | Admitting: Emergency Medicine

## 2021-03-26 ENCOUNTER — Emergency Department (HOSPITAL_COMMUNITY): Payer: Medicare Other

## 2021-03-26 ENCOUNTER — Other Ambulatory Visit: Payer: Self-pay

## 2021-03-26 ENCOUNTER — Emergency Department (HOSPITAL_COMMUNITY)
Admission: EM | Admit: 2021-03-26 | Discharge: 2021-03-27 | Disposition: A | Discharge status: Home / Self Care | Payer: Medicare Other | Attending: Emergency Medicine | Admitting: Emergency Medicine

## 2021-03-26 DIAGNOSIS — S4991XA Unspecified injury of right shoulder and upper arm, initial encounter: Secondary | ICD-10-CM | POA: Diagnosis present

## 2021-03-26 DIAGNOSIS — Z79899 Other long term (current) drug therapy: Secondary | ICD-10-CM | POA: Insufficient documentation

## 2021-03-26 DIAGNOSIS — Y999 Unspecified external cause status: Secondary | ICD-10-CM | POA: Diagnosis not present

## 2021-03-26 DIAGNOSIS — W19XXXA Unspecified fall, initial encounter: Secondary | ICD-10-CM | POA: Diagnosis not present

## 2021-03-26 DIAGNOSIS — Z87891 Personal history of nicotine dependence: Secondary | ICD-10-CM | POA: Insufficient documentation

## 2021-03-26 DIAGNOSIS — Z7901 Long term (current) use of anticoagulants: Secondary | ICD-10-CM | POA: Diagnosis not present

## 2021-03-26 DIAGNOSIS — S42291A Other displaced fracture of upper end of right humerus, initial encounter for closed fracture: Secondary | ICD-10-CM | POA: Insufficient documentation

## 2021-03-26 DIAGNOSIS — Y929 Unspecified place or not applicable: Secondary | ICD-10-CM | POA: Diagnosis not present

## 2021-03-26 DIAGNOSIS — G20 Parkinson's disease: Secondary | ICD-10-CM | POA: Insufficient documentation

## 2021-03-26 DIAGNOSIS — Z23 Encounter for immunization: Secondary | ICD-10-CM | POA: Insufficient documentation

## 2021-03-26 DIAGNOSIS — Y939 Activity, unspecified: Secondary | ICD-10-CM | POA: Diagnosis not present

## 2021-03-26 NOTE — ED Triage Notes (Signed)
PT BIB EMS from home.  Larey Seat off of garage steps approximately 2.5 feet landing on right side on concrete. Did not hit head.  Right shoulder obvious deformity anteriorly.  Distal PMS intact  No other pain complaints other than shoulder.  150 mcg fentanyl given en route as well as 4 mg zofran. 20g LAC

## 2021-03-26 NOTE — ED Provider Notes (Addendum)
Emergency Medicine Provider Triage Evaluation Note  Laura Fritz , a 80 y.o. female  was evaluated in triage.  Pt complains of right shoulder pain.  Symptomatic chemical fall earlier today while falling off the garage steps approximately 2.5 feet.  She landed on her right shoulder.  She is unsure if she hit her head head or any loss of consciousness.  She is on Plavix.  She has severe right shoulder pain and is unable to move her right arm.  She has normal sensation with no numbness.  Review of Systems  Positive:  Negative:   Physical Exam  There were no vitals taken for this visit. Gen:   Awake, no distress   Resp:  Normal effort  MSK:   Moves extremities without difficulty.  Unable to move right arm without significant pain.  Able to wiggle fingers.  2+ radial pulse bilaterally.  Capillary refill intact. Other:    Medical Decision Making  Medically screening exam initiated at 8:35 PM.  Appropriate orders placed.  Fidelia Cathers was informed that the remainder of the evaluation will be completed by another provider, this initial triage assessment does not replace that evaluation, and the importance of remaining in the ED until their evaluation is complete.  Per EMS, they state she has a shoulder dislocation. Unable to appreciate this on exam.    Therese Sarah 03/26/21 2036    Gerhard Munch, MD 03/26/21 2039    Claudie Leach, PA-C 03/26/21 2153    Gerhard Munch, MD 03/28/21 [phone]

## 2021-03-27 ENCOUNTER — Emergency Department (HOSPITAL_COMMUNITY): Payer: Medicare Other

## 2021-03-27 DIAGNOSIS — S42291A Other displaced fracture of upper end of right humerus, initial encounter for closed fracture: Secondary | ICD-10-CM | POA: Diagnosis not present

## 2021-03-27 MED ORDER — HYDROCODONE-ACETAMINOPHEN 5-325 MG PO TABS
1.0000 | ORAL_TABLET | Freq: Once | ORAL | Status: AC
  Administered 2021-03-27: 1 via ORAL
  Filled 2021-03-27: qty 1

## 2021-03-27 MED ORDER — TETANUS-DIPHTH-ACELL PERTUSSIS 5-2.5-18.5 LF-MCG/0.5 IM SUSY
0.5000 mL | PREFILLED_SYRINGE | Freq: Once | INTRAMUSCULAR | Status: AC
  Administered 2021-03-27: 0.5 mL via INTRAMUSCULAR
  Filled 2021-03-27: qty 0.5

## 2021-03-27 MED ORDER — FENTANYL CITRATE PF 50 MCG/ML IJ SOSY
100.0000 ug | PREFILLED_SYRINGE | Freq: Once | INTRAMUSCULAR | Status: AC
  Administered 2021-03-27: 100 ug via INTRAVENOUS
  Filled 2021-03-27: qty 2

## 2021-03-27 MED ORDER — KETOROLAC TROMETHAMINE 30 MG/ML IJ SOLN
15.0000 mg | Freq: Once | INTRAMUSCULAR | Status: AC
  Administered 2021-03-27: 15 mg via INTRAVENOUS
  Filled 2021-03-27: qty 1

## 2021-03-27 MED ORDER — HYDROCODONE-ACETAMINOPHEN 5-325 MG PO TABS: 1.0000 | ORAL_TABLET | Freq: Four times a day (QID) | ORAL | 0 refills | Status: DC | PRN

## 2021-03-27 NOTE — ED Notes (Signed)
Son here transported home.

## 2021-03-27 NOTE — ED Notes (Signed)
Spoke with son Onalee Hua , he is coming to pick patient up.

## 2021-03-27 NOTE — Progress Notes (Signed)
   03/27/21 0855  TOC ED Mini Assessment  TOC Time spent with patient (minutes): 30  PING Used in TOC Assessment Yes  Admission or Readmission Diverted Yes  Interventions which prevented an admission or readmission Home Health Consult or Services  What brought you to the Emergency Department?  fall  Barriers to Discharge Transportation  Barrier interventions awaiting for son to pick up at 1000  Means of departure Car  Patient states their goals for this hospitalization and ongoing recovery are: Go home and get some rest  CMS Medicare.gov Compare Post Acute Care list provided to: Patient  Choice offered to / list presented to  Patient    Laura Fritz J. Laura Roers, RN, BSN, [place] [phone] RNCM spoke with pt at bedside regarding discharge planning for Home Health Services. Offered pt medicare.gov list of home health agencies to choose from.  Pt chose [facility] to render services. Kandee Keen of [facility] notified. Patient made aware that [facility] will be in contact in 24-48 hours.  No DME needs identified at this time.

## 2021-03-27 NOTE — ED Notes (Signed)
Son advises he will be here to pick pt up approx 1000. Number updated in chart

## 2021-03-27 NOTE — ED Provider Notes (Signed)
[facility] EMERGENCY DEPARTMENT Provider Note   CSN: [id number] Arrival date & time: 03/26/21  2026     History Chief Complaint  Patient presents with   Shoulder Injury    Laura Fritz is a 80 y.o. female.  The history is provided by the patient and a relative.  Shoulder Injury This is a new problem. The current episode started 3 to 5 hours ago. The problem occurs constantly. The problem has been gradually worsening. Pertinent negatives include no chest pain, no headaches and no shortness of breath. Exacerbated by: movement. The symptoms are relieved by rest.  Patient presents after fall with right shoulder injury. She reports she was walking into her house when she lost her footing and landed on her right side.  Patient reports she can have difficulty ambulating due to Parkinson disease.  She landed on her right shoulder.  No LOC.  She reports most the pain is in her right shoulder.  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     Family History  Problem Relation Age of Onset   Lung cancer Mother    Esophageal cancer Maternal Grandfather    Esophageal cancer Cousin    Colon  cancer Neg Hx    Pancreatic cancer Neg Hx    Stomach cancer Neg Hx    Liver disease Neg Hx     Social History   Tobacco Use   Smoking status: Former   Smokeless tobacco: Never  Building services engineer Use: Some days  Substance Use Topics   Alcohol use: Yes    Comment: rare   Drug use: No    Home Medications Prior to Admission medications   Medication Sig Start Date End Date Taking? Authorizing Provider  buPROPion (WELLBUTRIN XL) 150 MG 24 hr tablet Take 1 tablet by mouth daily. 08/31/20   [provider]  Cholecalciferol (VITAMIN D3) 50000 units CAPS Take 50,000 Units by mouth every 14 (fourteen) days.     [provider]  clopidogrel (PLAVIX) 75 MG tablet Take 75 mg by mouth daily.    [provider]  diphenhydrAMINE (BENADRYL) 25 MG tablet Take 25 mg by mouth every 8 (eight) hours as needed for itching or allergies.    [provider]  gabapentin (NEURONTIN) 300 MG capsule Take 900 mg by mouth at bedtime.    [provider]  levothyroxine (SYNTHROID) 75 MCG tablet Take 1 tablet by mouth daily.    [provider]  loperamide (IMODIUM A-D) 2 MG tablet Take 4 mg by mouth at bedtime.    [provider]  nitroGLYCERIN (NITROSTAT) 0.4 MG SL tablet Place 0.4 mg under the tongue every 5 (five) minutes as needed for chest pain.    [provider]  omeprazole (PRILOSEC) 20 MG capsule Take 20 mg by mouth daily.    [provider]  ondansetron (ZOFRAN ODT) 4 MG disintegrating tablet Take 1 tablet (4 mg total) by mouth every 8 (eight) hours as needed for nausea or vomiting. 04/01/20   Milagros Loll, MD  simvastatin (ZOCOR) 20 MG tablet Take 20 mg by mouth daily at 6 PM.     [provider]  traZODone (DESYREL) 50 MG tablet Take 1-2 tablets by mouth at bedtime as needed. 08/31/20   [provider]    Allergies    Tramadol, Levofloxacin, Nsaids, and Sulfa antibiotics  Review of Systems    Review of Systems  Respiratory:  Negative for shortness of breath.   Cardiovascular:  Negative for chest pain.  Musculoskeletal:  Positive for arthralgias and joint swelling. Negative for neck pain.  Neurological:  Negative for headaches.  All other systems reviewed and are negative.  Physical Exam Updated Vital Signs BP 108/69 (BP Location: Left Arm)   Pulse 63   Temp 98 F (36.7 C)   Resp 18   SpO2 98%   Physical Exam CONSTITUTIONAL: Elderly, anxious HEAD: Normocephalic/atraumatic EYES: EOMI/PERRL ENMT: Mucous membranes moist NECK: supple no meningeal signs SPINE/BACK: No cervical spine tenderness CV: S1/S2 noted, no murmurs/rubs/gallops noted LUNGS: Lungs are clear to auscultation bilaterally, no apparent distress Chest-no bruising or crepitus ABDOMEN: soft, nontender NEURO: Pt is awake/alert/appropriate, moves all extremitiesx4.  No facial droop.  Able to make a good hand grip with right hand EXTREMITIES: pulses normal/equal in both upper extremities.  Significant tenderness, swelling and bruising noted to right anterior shoulder.  Abrasion and tenderness noted to right elbow.  There is no right wrist or hand tenderness. SKIN: warm, color normal PSYCH: Anxious ED Results / Procedures / Treatments   Labs (all labs ordered are listed, but only abnormal results are displayed) Labs Reviewed - No data to display  EKG None  Radiology DG Shoulder Right  Result Date: 03/26/2021 CLINICAL DATA:  Recent fall with right shoulder pain, initial encounter EXAM: RIGHT SHOULDER - 2+ VIEW COMPARISON:  None. FINDINGS: Comminuted fracture of the proximal humerus is noted with significant impaction at the fracture site and displacement of the fracture fragments. No definitive dislocation is noted although evaluation of the glenohumeral joint is limited. Degenerative changes of the acromioclavicular joint are seen. IMPRESSION: Comminuted proximal right humeral fracture with impaction at the  fracture site. Electronically Signed   By: Alcide Clever M.D.   On: 03/26/2021 21:17   DG Forearm Right  Result Date: 03/27/2021 CLINICAL DATA:  Right elbow pain with known right proximal humeral fracture. EXAM: RIGHT FOREARM - 2 VIEW COMPARISON:  None. FINDINGS: There is no evidence of fracture or other focal bone lesions. Soft tissues are unremarkable. Generalized osteopenia. IMPRESSION: Osteopenia without evidence of fractures. Electronically Signed   By: Almira Bar M.D.   On: 03/27/2021 01:10   CT Head Wo Contrast  Result Date: 03/26/2021 CLINICAL DATA:  Status post fall via stairs EXAM: CT HEAD WITHOUT CONTRAST CT CERVICAL SPINE WITHOUT CONTRAST TECHNIQUE: Multidetector CT imaging of the head and cervical spine was performed following the standard protocol without intravenous contrast. Multiplanar CT image reconstructions of the cervical spine were also generated. COMPARISON:  None. FINDINGS: CT HEAD FINDINGS Brain Cerebral ventricle sizes are concordant  with the degree of cerebral volume loss. Patchy and confluent areas of decreased attenuation are noted throughout the deep and periventricular white matter of the cerebral hemispheres bilaterally, compatible with chronic microvascular ischemic disease. No evidence of large-territorial acute infarction. No parenchymal hemorrhage. No mass lesion. No extra-axial collection. No mass effect or midline shift. No hydrocephalus. Basilar cisterns are patent. Vascular: No hyperdense vessel. Skull: No acute fracture or focal lesion. Sinuses/Orbits: Paranasal sinuses and mastoid air cells are clear. Bilateral lens replacement. Otherwise orbits are unremarkable. Other: None. CT CERVICAL SPINE FINDINGS Alignment: Grade 1 anterolisthesis of C2 on C3 and C3 on C4 likely due to degenerative changes. Straightening of the normal cervical lordosis likely due to degenerative changes and positioning. Skull base and vertebrae: Multilevel degenerative changes of the spine.  Associated moderate severe multilevel osseous neural foraminal stenosis. No severe osseous central canal stenosis. No acute fracture. No aggressive appearing focal osseous lesion or focal pathologic process. Soft tissues and spinal canal: No prevertebral fluid or swelling. No visible canal hematoma. Upper chest: Paraseptal emphysematous changes. Other: Carotid artery calcifications within the neck. IMPRESSION: 1. No acute intracranial abnormality. 2. No acute displaced fracture or traumatic listhesis of the cervical spine. 3.  Emphysema (ICD10-J43.9). Electronically Signed   By: Tish Frederickson M.D.   On: 03/26/2021 21:06   CT Cervical Spine Wo Contrast  Result Date: 03/26/2021 CLINICAL DATA:  Status post fall via stairs EXAM: CT HEAD WITHOUT CONTRAST CT CERVICAL SPINE WITHOUT CONTRAST TECHNIQUE: Multidetector CT imaging of the head and cervical spine was performed following the standard protocol without intravenous contrast. Multiplanar CT image reconstructions of the cervical spine were also generated. COMPARISON:  None. FINDINGS: CT HEAD FINDINGS Brain Cerebral ventricle sizes are concordant with the degree of cerebral volume loss. Patchy and confluent areas of decreased attenuation are noted throughout the deep and periventricular white matter of the cerebral hemispheres bilaterally, compatible with chronic microvascular ischemic disease. No evidence of large-territorial acute infarction. No parenchymal hemorrhage. No mass lesion. No extra-axial collection. No mass effect or midline shift. No hydrocephalus. Basilar cisterns are patent. Vascular: No hyperdense vessel. Skull: No acute fracture or focal lesion. Sinuses/Orbits: Paranasal sinuses and mastoid air cells are clear. Bilateral lens replacement. Otherwise orbits are unremarkable. Other: None. CT CERVICAL SPINE FINDINGS Alignment: Grade 1 anterolisthesis of C2 on C3 and C3 on C4 likely due to degenerative changes. Straightening of the normal cervical  lordosis likely due to degenerative changes and positioning. Skull base and vertebrae: Multilevel degenerative changes of the spine. Associated moderate severe multilevel osseous neural foraminal stenosis. No severe osseous central canal stenosis. No acute fracture. No aggressive appearing focal osseous lesion or focal pathologic process. Soft tissues and spinal canal: No prevertebral fluid or swelling. No visible canal hematoma. Upper chest: Paraseptal emphysematous changes. Other: Carotid artery calcifications within the neck. IMPRESSION: 1. No acute intracranial abnormality. 2. No acute displaced fracture or traumatic listhesis of the cervical spine. 3.  Emphysema (ICD10-J43.9). Electronically Signed   By: Tish Frederickson M.D.   On: 03/26/2021 21:06   CT Shoulder Right Wo Contrast  Result Date: 03/27/2021 CLINICAL DATA:  Re-evaluate shoulder fracture. EXAM: CT OF THE UPPER RIGHT EXTREMITY WITHOUT CONTRAST TECHNIQUE: Multidetector CT imaging of the upper right extremity was performed according to the standard protocol. COMPARISON:  Right shoulder series 03/26/2021. FINDINGS: Bones/Joint/Cartilage Osteopenia. There is comminuted impacted fracture of the proximal right humerus. This consists of a multi fragmented humeral head fracture with spreading of the fragments, largest of these fragments is  lateral; and a surgical neck proximal humeral fracture with the main distal fragment impacted up to 2 cm into the fracture bed with mild lateral angulation. The scapula and right clavicle are intact. The [facility] joint is intact. No glenohumeral or AC joint dislocation is seen. There is a slightly elevated distal right clavicle, slight spurring at the [facility] joint, and mild narrowing and osteophytosis at the sternoclavicular joint. No glenohumeral arthritic changes are visible. There are degenerative changes of the cervical spine without evidence of fractures with additional cervical spine findings described in detail in the  cervical spine CT report from yesterday. The visualized right ribs are intact. Ligaments Suboptimally assessed by CT. Muscles and Tendons The rotator cuff muscles are intact. The rotator cuff tendons not well evaluated with CT. The subscapularis tendon is not well seen and could be torn. Remainder of the rotator cuff tendons do not show evidence of a through and through tear. The bicipital tendon appears normally located. Soft tissues There is moderate stranding anteriorly in the shoulder region extending into the anterior upper arm. There is hemarthrosis in the glenohumeral joint space. Right lung fields moderately emphysematous but clear with centrilobular changes predominating. No mass is seen in the visualized right chest wall and axillary space. IMPRESSION: 1. Comminuted impacted proximal humeral fracture with mild lateral angulation of the main distal fragment. 2. Osteopenia and degenerative change without additional fractures. 3. Slightly elevated distal right clavicle, could be within physiologic joint laxity or due to second degree AC separation. 4. The subscapularis tendon not well seen and could be torn. Tendon anatomy otherwise not well evaluated. 5. COPD. Electronically Signed   By: Almira Bar M.D.   On: 03/27/2021 01:37    Procedures .Ortho Injury Treatment  Date/Time: 03/27/2021 2:25 AM Performed by: Zadie Rhine, MD Authorized by: Zadie Rhine, MD   Consent:    Consent obtained:  Verbal   Consent given by:  Patient   Risks discussed:  FractureInjury location: upper arm Location details: right upper arm Injury type: fracture Fracture type: humeral shaft Pre-procedure neurovascular assessment: neurovascularly intact Pre-procedure distal perfusion: normal Pre-procedure neurological function: normal Pre-procedure range of motion: reduced Manipulation performed: no Immobilization: sling Splint Applied by: ED Nurse and Ortho Tech Post-procedure distal perfusion:  normal Post-procedure neurological function: normal Post-procedure range of motion: unchanged     Medications Ordered in ED Medications  fentaNYL (SUBLIMAZE) injection 100 mcg (has no administration in time range)    ED Course  I have reviewed the triage vital signs and the nursing notes.  Pertinent  imaging results that were available during my care of the patient were reviewed by me and considered in my medical decision making (see chart for details).    MDM Rules/Calculators/A&P                           Patient presents after mechanical fall.  She has significant proximal right humerus fracture.  Discussed the case with Dr. Oswaldo Milian with orthopedics He is reviewed imaging.  He recommends shoulder sling, CT shoulder, and follow-up in the office this week 2:26 AM Patient feels more comfortable. Home health referral has been placed.  Patient will stay in the ER overnight and family will take her home in the morning after arrangements have been made for her safety at home She will call orthopedist later today for close follow-up. Final Clinical Impression(s) / ED Diagnoses Final diagnoses:  Other closed displaced fracture of proximal end of right  humerus, initial encounter    Rx / DC Orders ED Discharge Orders          Ordered    HYDROcodone-acetaminophen (NORCO/VICODIN) 5-325 MG tablet  Every 6 hours PRN        03/27/21 0225             Zadie Rhine, MD 03/27/21 [phone]

## 2021-03-28 NOTE — Progress Notes (Addendum)
Anesthesia Review:  PCP: DR Sharon Seller LOV 01/31/21  Cardiologist :was seen in [facility] by MD moved here a year ago does not have a cardiologist - per pt on phone call of 03/29/21.   Was seen by DR Marcha Dutton on 09/15/20 in epic per note Mi/PCI in 2005  Please note OV note with DR Helmberg on 12/07/20- regarding cardiology  Cardiology MD in [facility] [place] was DR Kassie Mends- called and reqquested most recent ov note, stress and echo if available.   Also hx of MVP per notes and pt  Chest x-ray : EKG : 04/25/20  Echo : Stress test: Cardiac Cath :  Activity level: can do  a flight of stairs without difficulty  Sleep Study/ CPAP : none Fasting Blood Sugar :      / Checks Blood Sugar -- times a day:   Blood Thinner/ Instructions /Last Dose: ASA / Instructions/ Last Dose :   Covid test day of surgery  Plavix last dose on 03/26/2021  In ED 03/26/21 for a fall  Covid test day of surgery  Pt states on 03/29/21 that she did not realized she had a preop appt.  Phone call done to complete med hx and instructions.  Lab appt set up for 03/30/2021 between 1-2pm.  Called son, Onalee Hua and confirmed appt.   Instructions not given for Benzoyl Peroxide gel because pt did not come in on 03/29/21 for preop and will not received printed instructions until 03/30/2021 in the pm.

## 2021-03-28 NOTE — Progress Notes (Addendum)
Your procedure is scheduled on:  03/31/2021   Report to [facility] Main  Entrance   Report to admitting at 1245PM pm    Call this number if you have problems the morning of surgery [phone]    REMEMBER: NO  SOLID FOOD CANDY OR GUM AFTER MIDNIGHT. CLEAR LIQUIDS UNTIL     1245pm      . NOTHING BY MOUTH EXCEPT CLEAR LIQUIDS UNTIL    1245pm   . PLEASE FINISH ENSURE DRINK PER SURGEON ORDER  WHICH NEEDS TO BE COMPLETED AT    1245pm   .      CLEAR LIQUID DIET   Foods Allowed                                                                    Coffee and tea, regular and decaf                            Fruit ices (not with fruit pulp)                                      Iced Popsicles                                    Carbonated beverages, regular and diet                                    Cranberry, grape and apple juices Sports drinks like Gatorade Lightly seasoned clear broth or consume(fat free) Sugar, honey syrup ___________________________________________________________________      BRUSH YOUR TEETH MORNING OF SURGERY AND RINSE YOUR MOUTH OUT, NO CHEWING GUM CANDY OR MINTS.     Take these medicines the morning of surgery with A SIP OF WATER:   wellbutrin ( BUPROPION), lexapro ( escitalopram ) , synthroid, ( levothyroxine ) , (omeprazole)  prilosec   DO NOT TAKE ANY DIABETIC MEDICATIONS DAY OF YOUR SURGERY                               You may not have any metal on your body including hair pins and              piercings  Do not wear jewelry, make-up, lotions, powders or perfumes, deodorant             Do not wear nail polish on your fingernails.  Do not shave  48 hours prior to surgery.              Men may shave face and neck.   Do not bring valuables to the hospital. [facility] IS NOT             RESPONSIBLE   FOR VALUABLES.  Contacts, dentures or bridgework may not be worn into surgery.  Leave suitcase in the car. After surgery it may be  brought to your room.  Patients discharged the day of surgery will not be allowed to drive home. IF YOU ARE HAVING SURGERY AND GOING HOME THE SAME DAY, YOU MUST HAVE AN ADULT TO DRIVE YOU HOME AND BE WITH YOU FOR 24 HOURS. YOU MAY GO HOME BY TAXI OR UBER OR ORTHERWISE, BUT AN ADULT MUST ACCOMPANY YOU HOME AND STAY WITH YOU FOR 24 HOURS.  Name and phone number of your driver:  Special Instructions: N/A              Please read over the following fact sheets you were given: _____________________________________________________________________  [facility] - Preparing for Surgery Before surgery, you can play an important role.  Because skin is not sterile, your skin needs to be as free of germs as possible.  You can reduce the number of germs on your skin by washing with CHG (chlorahexidine gluconate) soap before surgery.  CHG is an antiseptic cleaner which kills germs and bonds with the skin to continue killing germs even after washing. Please DO NOT use if you have an allergy to CHG or antibacterial soaps.  If your skin becomes reddened/irritated stop using the CHG and inform your nurse when you arrive at Short Stay. Do not shave (including legs and underarms) for at least 48 hours prior to the first CHG shower.  You may shave your face/neck. Please follow these instructions carefully:  1.  Shower with CHG Soap the night before surgery and the  morning of Surgery.  2.  If you choose to wash your hair, wash your hair first as usual with your  normal  shampoo.  3.  After you shampoo, rinse your hair and body thoroughly to remove the  shampoo.                           4.  Use CHG as you would any other liquid soap.  You can apply chg directly  to the skin and wash                       Gently with a scrungie or clean washcloth.  5.  Apply the CHG Soap to your body ONLY FROM THE NECK DOWN.   Do not use on face/ open                           Wound or open sores. Avoid contact with eyes, ears mouth  and genitals (private parts).                       Wash face,  Genitals (private parts) with your normal soap.             6.  Wash thoroughly, paying special attention to the area where your surgery  will be performed.  7.  Thoroughly rinse your body with warm water from the neck down.  8.  DO NOT shower/wash with your normal soap after using and rinsing off  the CHG Soap.                9.  Pat yourself dry with a clean towel.            10.  Wear clean pajamas.            11.  Place clean sheets on your bed the night of your first shower and do not  sleep with pets.  Day of Surgery : Do not apply any lotions/deodorants the morning of surgery.  Please wear clean clothes to the hospital/surgery center.  FAILURE TO FOLLOW THESE INSTRUCTIONS MAY RESULT IN THE CANCELLATION OF YOUR SURGERY PATIENT SIGNATURE_________________________________  NURSE SIGNATURE__________________________________  ________________________________________________________________________

## 2021-03-28 NOTE — Progress Notes (Deleted)
[facility]- Preparing for Total Shoulder Arthroplasty  °  °Before surgery, you can play an important role. Because skin is not sterile, your skin needs to be as free of germs as possible. You can reduce the number of germs on your skin by using the following products. °Benzoyl Peroxide Gel °Reduces the number of germs present on the skin °Applied twice a day to shoulder area starting two days before surgery   ° °================================================================== ° °Please follow these instructions carefully: ° °BENZOYL PEROXIDE 5% GEL ° °Please do not use if you have an allergy to benzoyl peroxide.   If your skin becomes reddened/irritated stop using the benzoyl peroxide. ° °Starting two days before surgery, apply as follows: °Apply benzoyl peroxide in the morning and at night. Apply after taking a shower. If you are not taking a shower clean entire shoulder front, back, and side along with the armpit with a clean wet washcloth. ° °Place a quarter-sized dollop on your shoulder and rub in thoroughly, making sure to cover the front, back, and side of your shoulder, along with the armpit.  ° °2 days before ____ AM   ____ PM              1 day before ____ AM   ____ PM °                        °Do this twice a day for two days.  (Last application is the night before surgery, AFTER using the CHG soap as described below). ° °Do NOT apply benzoyl peroxide gel on the day of surgery.  °

## 2021-03-28 NOTE — H&P (Signed)
PREOPERATIVE H&P  Chief Complaint: right proximal humerus fracture  HPI: Laura Fritz is a 80 y.o. female who is scheduled for Procedure(s): REVERSE SHOULDER ARTHROPLASTY.   Patient has a past medical history significant for GERD, parkinson's disease, MI with stents.   Patient had an injury on 03/26/2021. She reports she was walking into her house when she lost her footing and landed on her right side.  Patient reports she can have difficulty ambulating due to Parkinson disease.  She landed on her right shoulder.    Her symptoms are rated as moderate to severe, and have been worsening.  This is significantly impairing activities of daily living.    Please see clinic note for further details on this patient's care.    She has elected for surgical management.   Past Medical History:  Diagnosis Date   Anxiety    Back pain    Depression    Fibromyalgia    GERD (gastroesophageal reflux disease)    Graves disease    Hypercholesteremia    Myocardial infarct (HCC)    Parkinson disease (HCC)    Past Surgical History:  Procedure Laterality Date   ABDOMINAL HYSTERECTOMY     ANTERIOR (CYSTOCELE) AND POSTERIOR REPAIR (RECTOCELE) WITH XENFORM GRAFT AND SACROSPINOUS FIXATION     Social History   Socioeconomic History   Marital status: Single    Spouse name: Not on file   Number of children: Not on file   Years of education: Not on file   Highest education level: Not on file  Occupational History   Not on file  Tobacco Use   Smoking status: Former   Smokeless tobacco: Never  Vaping Use   Vaping Use: Some days  Substance and Sexual Activity   Alcohol use: Yes    Comment: rare   Drug use: No   Sexual activity: Never  Other Topics Concern   Not on file  Social History Narrative   Not on file   Social Determinants of Health   Financial Resource Strain: Not on file  Food Insecurity: Not on file  Transportation Needs: Not on file  Physical Activity: Not on file   Stress: Not on file  Social Connections: Not on file   Family History  Problem Relation Age of Onset   Lung cancer Mother    Esophageal cancer Maternal Grandfather    Esophageal cancer Cousin    Colon cancer Neg Hx    Pancreatic cancer Neg Hx    Stomach cancer Neg Hx    Liver disease Neg Hx    Allergies  Allergen Reactions   Tramadol Anaphylaxis    Oral cavity tremors   Levofloxacin Other (See Comments)    Tendonitis   Nsaids Rash   Sulfa Antibiotics Rash   Prior to Admission medications   Medication Sig Start Date End Date Taking? Authorizing Provider  buPROPion (WELLBUTRIN XL) 300 MG 24 hr tablet Take 300 mg by mouth every morning. 02/22/21  Yes [provider]  clopidogrel (PLAVIX) 75 MG tablet Take 75 mg by mouth daily.   Yes [provider]  diphenhydrAMINE (BENADRYL) 25 MG tablet Take 25 mg by mouth daily.   Yes [provider]  escitalopram (LEXAPRO) 10 MG tablet Take 10 mg by mouth in the morning. 03/14/21  Yes [provider]  gabapentin (NEURONTIN) 300 MG capsule Take 900 mg by mouth at bedtime.   Yes [provider]  HYDROcodone-acetaminophen (NORCO/VICODIN) 5-325 MG tablet Take 1 tablet by mouth  every 6 (six) hours as needed. 03/27/21  Yes Zadie Rhine, MD  levothyroxine (SYNTHROID) 75 MCG tablet Take 75 mcg by mouth in the morning.   Yes [provider]  loperamide (IMODIUM) 2 MG capsule Take 2 mg by mouth 4 (four) times daily as needed for diarrhea or loose stools. 03/21/21  Yes [provider]  nitroGLYCERIN (NITROSTAT) 0.4 MG SL tablet Place 0.4 mg under the tongue every 5 (five) minutes as needed for chest pain.   Yes [provider]  omeprazole (PRILOSEC) 20 MG capsule Take 20 mg by mouth in the morning.   Yes [provider]  simvastatin (ZOCOR) 20 MG tablet Take 20 mg by mouth daily at 6 PM.    Yes [provider]  traZODone (DESYREL) 50 MG tablet Take 50 mg by mouth at  bedtime as needed for sleep. 08/31/20  Yes [provider]  Vitamin D, Ergocalciferol, (DRISDOL) 1.25 MG (50000 UNIT) CAPS capsule Take 50,000 Units by mouth every 14 (fourteen) days.   Yes [provider]  amphetamine-dextroamphetamine (ADDERALL) 10 MG tablet Take 10 mg by mouth in the morning. 03/21/21   [provider]    ROS: All other systems have been reviewed and were otherwise negative with the exception of those mentioned in the HPI and as above.  Physical Exam: General: Alert, no acute distress Cardiovascular: No pedal edema Respiratory: No cyanosis, no use of accessory musculature GI: No organomegaly, abdomen is soft and non-tender Skin: No lesions in the area of chief complaint Neurologic: Sensation intact distally Psychiatric: Patient is competent for consent with normal mood and affect Lymphatic: No axillary or cervical lymphadenopathy  MUSCULOSKELETAL:  RUE: swelling and bruising of right shoulder. ROM not tested in setting of known fracture. NVI  Imaging: CT scan of the right shoulder demonstrates comminuted impacted proximal humerus fracture  Assessment: right proximal humerus fracture  Plan: Plan for Procedure(s): REVERSE SHOULDER ARTHROPLASTY  The risks benefits and alternatives were discussed with the patient including but not limited to the risks of nonoperative treatment, versus surgical intervention including infection, bleeding, nerve injury,  blood clots, cardiopulmonary complications, morbidity, mortality, among others, and they were willing to proceed.   We additionally specifically discussed risks of axillary nerve injury, infection, periprosthetic fracture, continued pain and longevity of implants prior to beginning procedure.    Patient will be closely monitored in PACU for medical stabilization and pain control. If found stable in PACU, patient may be discharged home with outpatient follow-up. If any concerns regarding  patient's stabilization patient will be admitted for observation after surgery. The patient is planning to be discharged home with outpatient PT.   The patient acknowledged the explanation, agreed to proceed with the plan and consent was signed.   Operative Plan: Right reverse total shoulder arthroplasty for fracture Discharge Medications: Standard DVT Prophylaxis: Resume plavix Physical Therapy: outpatient PT Special Discharge needs: Sling. IceMan   Vernetta Honey, PA-C  03/28/2021 5:51 PM

## 2021-03-29 ENCOUNTER — Other Ambulatory Visit: Payer: Self-pay

## 2021-03-29 ENCOUNTER — Encounter (HOSPITAL_COMMUNITY)
Admission: RE | Admit: 2021-03-29 | Discharge: 2021-03-29 | Disposition: A | Discharge status: Home / Self Care | Payer: Medicare Other | Source: Ambulatory Visit

## 2021-03-29 ENCOUNTER — Encounter (HOSPITAL_COMMUNITY): Payer: Self-pay

## 2021-03-29 DIAGNOSIS — Z01818 Encounter for other preprocedural examination: Secondary | ICD-10-CM | POA: Diagnosis present

## 2021-03-29 DIAGNOSIS — K529 Noninfective gastroenteritis and colitis, unspecified: Secondary | ICD-10-CM | POA: Diagnosis not present

## 2021-03-29 HISTORY — DX: Inflammatory liver disease, unspecified: K75.9

## 2021-03-29 HISTORY — DX: Hypothyroidism, unspecified: E03.9

## 2021-03-29 HISTORY — DX: Atherosclerotic heart disease of native coronary artery without angina pectoris: I25.10

## 2021-03-29 HISTORY — DX: Cardiac murmur, unspecified: R01.1

## 2021-03-29 NOTE — Progress Notes (Signed)
Anesthesia Chart Review   Case: [id number] Date/Time: 03/31/21 1530   Procedure: REVERSE SHOULDER ARTHROPLASTY (Right: Shoulder)   Anesthesia type: Choice   Pre-op diagnosis: right proximal humerus fracture   Location: [facility] ROOM 07 / [facility]   Surgeons: Bjorn Pippin, MD       DISCUSSION:80 y.o. former smoker with h/o GERD, Graves disease, Parkinson disease, CAD (stent 2005), mitral prolapse, right proximal humerus fracture scheduled for above procedure 03/31/2021 with Dr. Ramond Marrow.   Pt last seen by cardiology 09/15/2020. Per notes pt can walk 1/4 mile without problems, does not exercise regularly.  Pt with mild systolic murmur. Echo ordered at this visit.    Seen by cardiology 12/07/2020. Per OV note, "Mitral valve prolapse, CAD history of MI patient declines to go back to cardiology and declines going to stress test and getting echocardiogram done she tells me she is "DNR" and is not interested in having any further testing for her heart"  Pt with injury to right shoulder 03/26/21, proximal humerus fracture.  Pt reports last dose of Plavix 03/26/2021.   Discussed with Dr. Okey Dupre, ok to proceed.  I have requested documentation from previous cardiologist in [facility], pending.   Pt not seen in clinic, same day workup, labs DOS.  VS: There were no vitals taken for this visit.  PROVIDERS: Pcp, No   LABS:  labs DOS (all labs ordered are listed, but only abnormal results are displayed)  Labs Reviewed - No data to display   IMAGES:   EKG: 04/25/2020 Rate 74 bpm  Sinus rhythm   CV:  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HCC)    Parkinson disease (HCC)     Past Surgical History:  Procedure Laterality Date   ABDOMINAL HYSTERECTOMY      ANTERIOR (CYSTOCELE) AND POSTERIOR REPAIR (RECTOCELE) WITH XENFORM GRAFT AND SACROSPINOUS FIXATION     TUBAL LIGATION      MEDICATIONS:  escitalopram (LEXAPRO) 10 MG tablet   amphetamine-dextroamphetamine (ADDERALL) 10 MG tablet   buPROPion (WELLBUTRIN XL) 300 MG 24 hr tablet   clopidogrel (PLAVIX) 75 MG tablet   diphenhydrAMINE (BENADRYL) 25 MG tablet   gabapentin (NEURONTIN) 300 MG capsule   HYDROcodone-acetaminophen (NORCO/VICODIN) 5-325 MG tablet   levothyroxine (SYNTHROID) 75 MCG tablet   loperamide (IMODIUM) 2 MG capsule   nitroGLYCERIN (NITROSTAT) 0.4 MG SL tablet   omeprazole (PRILOSEC) 20 MG capsule   simvastatin (ZOCOR) 20 MG tablet   traZODone (DESYREL) 50 MG tablet   Vitamin D, Ergocalciferol, (DRISDOL) 1.25 MG (50000 UNIT) CAPS capsule   No current facility-administered medications for this encounter.     Jodell Cipro Ward, PA-C [facility] [phone]

## 2021-03-30 ENCOUNTER — Other Ambulatory Visit: Payer: Self-pay

## 2021-03-30 ENCOUNTER — Encounter (HOSPITAL_COMMUNITY)
Admission: RE | Admit: 2021-03-30 | Discharge: 2021-03-30 | Disposition: A | Discharge status: Home / Self Care | Payer: Medicare Other | Source: Ambulatory Visit | Attending: Orthopaedic Surgery | Admitting: Orthopaedic Surgery

## 2021-03-30 VITALS — BP 79/58 | HR 103 | Temp 98.1°F | Resp 16 | Ht 63.0 in | Wt 150.0 lb

## 2021-03-30 DIAGNOSIS — Z01818 Encounter for other preprocedural examination: Secondary | ICD-10-CM | POA: Diagnosis not present

## 2021-03-30 DIAGNOSIS — K529 Noninfective gastroenteritis and colitis, unspecified: Secondary | ICD-10-CM | POA: Insufficient documentation

## 2021-03-30 LAB — BASIC METABOLIC PANEL
Anion gap: 9 (ref 5–15)
BUN: 18 mg/dL (ref 8–23)
CO2: 27 mmol/L (ref 22–32)
Calcium: 9.1 mg/dL (ref 8.9–10.3)
Chloride: 103 mmol/L (ref 98–111)
Creatinine, Ser: 1.36 mg/dL — ABNORMAL HIGH (ref 0.44–1.00)
GFR, Estimated: 40 mL/min — ABNORMAL LOW (ref >60)
Glucose, Bld: 112 mg/dL — ABNORMAL HIGH (ref 70–99)
Potassium: 3.6 mmol/L (ref 3.5–5.1)
Sodium: 139 mmol/L (ref 135–145)

## 2021-03-30 LAB — CBC
HCT: 42.6 % (ref 36.0–46.0)
Hemoglobin: 14 g/dL (ref 12.0–15.0)
MCH: 31.9 pg (ref 26.0–34.0)
MCHC: 32.9 g/dL (ref 30.0–36.0)
MCV: 97 fL (ref 80.0–100.0)
Platelets: 249 10*3/uL (ref 150–400)
RBC: 4.39 MIL/uL (ref 3.87–5.11)
RDW: 14.4 % (ref 11.5–15.5)
WBC: 7.4 10*3/uL (ref 4.0–10.5)
nRBC: 0 % (ref 0.0–0.2)

## 2021-03-30 LAB — SURGICAL PCR SCREEN
MRSA, PCR: NEGATIVE
Staphylococcus aureus: NEGATIVE

## 2021-03-30 NOTE — Progress Notes (Signed)
Placed on chart the  following:  LOV note DR Kassie Mends- cardiology - 02/12/2019 and ekg done 02/12/19

## 2021-03-30 NOTE — Progress Notes (Addendum)
PT came in today for preop labs Son with her.  Initial blood pressure was 79/58.  Upon recheck after 10 minutes b/p was 72/53.  Pulse- 106.  Temp 98.1 PT stated when asked " I do feel a llittle dizzy.  ".  Gwendolyn Lima, NA TEch stated pt felt clammy tou touch when doing EKG.  Sonreports pt took Hydrocodone right prior to arrival and has been talking Valium twice a day for the last 2 days.  PT reports she has been eating and drinking.  PT alert andoriented.  EKG done.  Jessica WARd, [facility] made aware of pt status and saw pt in PST.  Aware of EKG tracing.  Shanda Bumps Ward, APC spoke with pt and son and informed if any thing changes in pt status to go to ED.  Son was made aware to watch for any confusion and to push fluids for pt PT also voiced understanding.  Jessica WArd, PAC ordered u/a Pt drank an entire bottle of water at preop but was still unable to void.  Jessica WArd, [facility] made aware .  Will discharge pt to home with son.  Son states upon discharge that he is stopping Valium and limiting Hydrocodone.  Leticia Clas informed pt and son to only take what pain med is needed.  PT  and son voiced understanding.  B/P upon discharge was 90/64.

## 2021-03-31 ENCOUNTER — Ambulatory Visit (HOSPITAL_COMMUNITY): Payer: Medicare Other | Admitting: Certified Registered Nurse Anesthetist

## 2021-03-31 ENCOUNTER — Observation Stay (HOSPITAL_COMMUNITY)
Admission: RE | Admit: 2021-03-31 | Discharge: 2021-04-01 | Disposition: A | Discharge status: Home / Self Care | Payer: Medicare Other | Source: Ambulatory Visit | Attending: Orthopaedic Surgery | Admitting: Orthopaedic Surgery

## 2021-03-31 ENCOUNTER — Encounter (HOSPITAL_COMMUNITY)
Admission: RE | Disposition: A | Discharge status: Home / Self Care | Payer: Self-pay | Source: Ambulatory Visit | Attending: Orthopaedic Surgery

## 2021-03-31 ENCOUNTER — Observation Stay (HOSPITAL_COMMUNITY): Payer: Medicare Other

## 2021-03-31 ENCOUNTER — Ambulatory Visit (HOSPITAL_COMMUNITY): Payer: Medicare Other | Admitting: Physician Assistant

## 2021-03-31 ENCOUNTER — Encounter (HOSPITAL_COMMUNITY): Payer: Self-pay | Admitting: Orthopaedic Surgery

## 2021-03-31 ENCOUNTER — Other Ambulatory Visit: Payer: Self-pay

## 2021-03-31 DIAGNOSIS — Z96611 Presence of right artificial shoulder joint: Secondary | ICD-10-CM

## 2021-03-31 DIAGNOSIS — Z20822 Contact with and (suspected) exposure to covid-19: Secondary | ICD-10-CM | POA: Diagnosis not present

## 2021-03-31 DIAGNOSIS — G20 Parkinson's disease: Secondary | ICD-10-CM | POA: Insufficient documentation

## 2021-03-31 DIAGNOSIS — Z01818 Encounter for other preprocedural examination: Secondary | ICD-10-CM

## 2021-03-31 DIAGNOSIS — K529 Noninfective gastroenteritis and colitis, unspecified: Secondary | ICD-10-CM

## 2021-03-31 DIAGNOSIS — Z87891 Personal history of nicotine dependence: Secondary | ICD-10-CM | POA: Insufficient documentation

## 2021-03-31 DIAGNOSIS — X58XXXA Exposure to other specified factors, initial encounter: Secondary | ICD-10-CM | POA: Insufficient documentation

## 2021-03-31 DIAGNOSIS — I252 Old myocardial infarction: Secondary | ICD-10-CM | POA: Insufficient documentation

## 2021-03-31 DIAGNOSIS — Z09 Encounter for follow-up examination after completed treatment for conditions other than malignant neoplasm: Secondary | ICD-10-CM

## 2021-03-31 DIAGNOSIS — Z79899 Other long term (current) drug therapy: Secondary | ICD-10-CM | POA: Insufficient documentation

## 2021-03-31 DIAGNOSIS — S42291A Other displaced fracture of upper end of right humerus, initial encounter for closed fracture: Principal | ICD-10-CM | POA: Insufficient documentation

## 2021-03-31 HISTORY — PX: REVERSE SHOULDER ARTHROPLASTY: SHX5054

## 2021-03-31 LAB — URINALYSIS, ROUTINE W REFLEX MICROSCOPIC
Amorphous Crystal: NONE SEEN
Bilirubin Urine: NEGATIVE
Glucose, UA: NEGATIVE mg/dL
Hgb urine dipstick: NEGATIVE
Ketones, ur: NEGATIVE mg/dL
Nitrite: NEGATIVE
Protein, ur: NEGATIVE mg/dL
Specific Gravity, Urine: 1.005 (ref 1.005–1.030)
pH: 6 (ref 5.0–8.0)

## 2021-03-31 LAB — SARS CORONAVIRUS 2 BY RT PCR (HOSPITAL ORDER, PERFORMED IN ~~LOC~~ HOSPITAL LAB): SARS Coronavirus 2: NEGATIVE

## 2021-03-31 SURGERY — ARTHROPLASTY, SHOULDER, TOTAL, REVERSE
Anesthesia: Regional | Site: Shoulder | Laterality: Right

## 2021-03-31 MED ORDER — FENTANYL CITRATE (PF) 100 MCG/2ML IJ SOLN
INTRAMUSCULAR | Status: AC
  Filled 2021-03-31: qty 2

## 2021-03-31 MED ORDER — BISACODYL 10 MG RE SUPP: 10.0000 mg | Freq: Every day | RECTAL | Status: DC | PRN

## 2021-03-31 MED ORDER — EPHEDRINE SULFATE-NACL 50-0.9 MG/10ML-% IV SOSY
PREFILLED_SYRINGE | INTRAVENOUS | Status: DC | PRN
  Administered 2021-03-31 (×2): 5 mg via INTRAVENOUS

## 2021-03-31 MED ORDER — PANTOPRAZOLE SODIUM 40 MG PO TBEC
40.0000 mg | DELAYED_RELEASE_TABLET | Freq: Every day | ORAL | Status: DC
  Administered 2021-04-01: 40 mg via ORAL
  Filled 2021-03-31: qty 1

## 2021-03-31 MED ORDER — CEFAZOLIN SODIUM-DEXTROSE 2-4 GM/100ML-% IV SOLN
2.0000 g | INTRAVENOUS | Status: AC
  Administered 2021-03-31: 2 g via INTRAVENOUS
  Filled 2021-03-31: qty 100

## 2021-03-31 MED ORDER — ONDANSETRON HCL 4 MG/2ML IJ SOLN: 4.0000 mg | Freq: Four times a day (QID) | INTRAMUSCULAR | Status: DC | PRN

## 2021-03-31 MED ORDER — POLYETHYLENE GLYCOL 3350 17 G PO PACK: 17.0000 g | PACK | Freq: Every day | ORAL | Status: DC | PRN

## 2021-03-31 MED ORDER — MORPHINE SULFATE (PF) 2 MG/ML IV SOLN: 0.5000 mg | INTRAVENOUS | Status: DC | PRN

## 2021-03-31 MED ORDER — ONDANSETRON HCL 4 MG/2ML IJ SOLN
INTRAMUSCULAR | Status: DC | PRN
  Administered 2021-03-31: 4 mg via INTRAVENOUS

## 2021-03-31 MED ORDER — FENTANYL CITRATE PF 50 MCG/ML IJ SOSY
50.0000 ug | PREFILLED_SYRINGE | INTRAMUSCULAR | Status: DC
  Administered 2021-03-31: 100 ug via INTRAVENOUS
  Filled 2021-03-31: qty 2

## 2021-03-31 MED ORDER — PHENYLEPHRINE HCL-NACL 20-0.9 MG/250ML-% IV SOLN
INTRAVENOUS | Status: DC | PRN
  Administered 2021-03-31: 50 ug/min via INTRAVENOUS

## 2021-03-31 MED ORDER — HYDROCODONE-ACETAMINOPHEN 5-325 MG PO TABS: 1.0000 | ORAL_TABLET | ORAL | Status: DC | PRN

## 2021-03-31 MED ORDER — ONDANSETRON HCL 4 MG PO TABS: 4.0000 mg | ORAL_TABLET | Freq: Four times a day (QID) | ORAL | Status: DC | PRN

## 2021-03-31 MED ORDER — ACETAMINOPHEN 500 MG PO TABS: 1000.0000 mg | ORAL_TABLET | Freq: Three times a day (TID) | ORAL | 0 refills | Status: AC

## 2021-03-31 MED ORDER — HYDROCODONE-ACETAMINOPHEN 7.5-325 MG PO TABS: 1.0000 | ORAL_TABLET | ORAL | Status: DC | PRN

## 2021-03-31 MED ORDER — STERILE WATER FOR IRRIGATION IR SOLN
Status: DC | PRN
  Administered 2021-03-31: 2000 mL

## 2021-03-31 MED ORDER — VANCOMYCIN HCL 1 G IV SOLR
INTRAVENOUS | Status: DC | PRN
  Administered 2021-03-31: 1000 mg via TOPICAL

## 2021-03-31 MED ORDER — PHENYLEPHRINE HCL (PRESSORS) 10 MG/ML IV SOLN
INTRAVENOUS | Status: AC
  Filled 2021-03-31: qty 1

## 2021-03-31 MED ORDER — DOCUSATE SODIUM 100 MG PO CAPS
100.0000 mg | ORAL_CAPSULE | Freq: Two times a day (BID) | ORAL | Status: DC
  Administered 2021-03-31 – 2021-04-01 (×2): 100 mg via ORAL
  Filled 2021-03-31 (×2): qty 1

## 2021-03-31 MED ORDER — CEFAZOLIN SODIUM-DEXTROSE 1-4 GM/50ML-% IV SOLN
1.0000 g | Freq: Three times a day (TID) | INTRAVENOUS | Status: DC
  Administered 2021-04-01 (×2): 1 g via INTRAVENOUS
  Filled 2021-03-31 (×2): qty 50

## 2021-03-31 MED ORDER — FENTANYL CITRATE (PF) 100 MCG/2ML IJ SOLN
INTRAMUSCULAR | Status: DC | PRN
  Administered 2021-03-31: 100 ug via INTRAVENOUS

## 2021-03-31 MED ORDER — SUGAMMADEX SODIUM 200 MG/2ML IV SOLN
INTRAVENOUS | Status: DC | PRN
  Administered 2021-03-31: 200 mg via INTRAVENOUS

## 2021-03-31 MED ORDER — DEXAMETHASONE SODIUM PHOSPHATE 10 MG/ML IJ SOLN
INTRAMUSCULAR | Status: DC | PRN
  Administered 2021-03-31: 6 mg via INTRAVENOUS

## 2021-03-31 MED ORDER — VANCOMYCIN HCL 1000 MG IV SOLR
INTRAVENOUS | Status: AC
  Filled 2021-03-31: qty 20

## 2021-03-31 MED ORDER — FENTANYL CITRATE PF 50 MCG/ML IJ SOSY: 25.0000 ug | PREFILLED_SYRINGE | INTRAMUSCULAR | Status: DC | PRN

## 2021-03-31 MED ORDER — TRANEXAMIC ACID-NACL 1000-0.7 MG/100ML-% IV SOLN
1000.0000 mg | INTRAVENOUS | Status: AC
  Administered 2021-03-31: 1000 mg via INTRAVENOUS
  Filled 2021-03-31: qty 100

## 2021-03-31 MED ORDER — DEXAMETHASONE SODIUM PHOSPHATE 10 MG/ML IJ SOLN
INTRAMUSCULAR | Status: AC
  Filled 2021-03-31: qty 1

## 2021-03-31 MED ORDER — SODIUM CHLORIDE 0.9 % IR SOLN
Status: DC | PRN
  Administered 2021-03-31: 1000 mL

## 2021-03-31 MED ORDER — OXYCODONE HCL 5 MG PO TABS: ORAL_TABLET | ORAL | 0 refills | Status: AC

## 2021-03-31 MED ORDER — ROCURONIUM BROMIDE 10 MG/ML (PF) SYRINGE
PREFILLED_SYRINGE | INTRAVENOUS | Status: DC | PRN
  Administered 2021-03-31: 50 mg via INTRAVENOUS

## 2021-03-31 MED ORDER — PHENYLEPHRINE 40 MCG/ML (10ML) SYRINGE FOR IV PUSH (FOR BLOOD PRESSURE SUPPORT)
PREFILLED_SYRINGE | INTRAVENOUS | Status: DC | PRN
  Administered 2021-03-31 (×2): 160 ug via INTRAVENOUS

## 2021-03-31 MED ORDER — ROCURONIUM BROMIDE 10 MG/ML (PF) SYRINGE
PREFILLED_SYRINGE | INTRAVENOUS | Status: AC
  Filled 2021-03-31: qty 10

## 2021-03-31 MED ORDER — ESCITALOPRAM OXALATE 10 MG PO TABS
10.0000 mg | ORAL_TABLET | Freq: Every day | ORAL | Status: DC
  Administered 2021-04-01: 10 mg via ORAL
  Filled 2021-03-31: qty 1

## 2021-03-31 MED ORDER — PROPOFOL 10 MG/ML IV BOLUS
INTRAVENOUS | Status: AC
  Filled 2021-03-31: qty 20

## 2021-03-31 MED ORDER — EPHEDRINE 5 MG/ML INJ
INTRAVENOUS | Status: AC
  Filled 2021-03-31: qty 5

## 2021-03-31 MED ORDER — ACETAMINOPHEN 325 MG PO TABS: 325.0000 mg | ORAL_TABLET | Freq: Four times a day (QID) | ORAL | Status: DC | PRN

## 2021-03-31 MED ORDER — LOPERAMIDE HCL 2 MG PO CAPS: 2.0000 mg | ORAL_CAPSULE | Freq: Four times a day (QID) | ORAL | Status: DC | PRN

## 2021-03-31 MED ORDER — PHENOL 1.4 % MT LIQD: 1.0000 | OROMUCOSAL | Status: DC | PRN

## 2021-03-31 MED ORDER — LACTATED RINGERS IV BOLUS
250.0000 mL | Freq: Once | INTRAVENOUS | Status: AC
  Administered 2021-03-31: 250 mL via INTRAVENOUS

## 2021-03-31 MED ORDER — GABAPENTIN 300 MG PO CAPS
900.0000 mg | ORAL_CAPSULE | Freq: Every day | ORAL | Status: DC
  Administered 2021-03-31: 900 mg via ORAL
  Filled 2021-03-31: qty 3

## 2021-03-31 MED ORDER — NITROGLYCERIN 0.4 MG SL SUBL: 0.4000 mg | SUBLINGUAL_TABLET | SUBLINGUAL | Status: DC | PRN

## 2021-03-31 MED ORDER — TRAZODONE HCL 50 MG PO TABS: 50.0000 mg | ORAL_TABLET | Freq: Every evening | ORAL | Status: DC | PRN

## 2021-03-31 MED ORDER — PROPOFOL 10 MG/ML IV BOLUS
INTRAVENOUS | Status: DC | PRN
  Administered 2021-03-31: 20 mg via INTRAVENOUS
  Administered 2021-03-31: 140 mg via INTRAVENOUS

## 2021-03-31 MED ORDER — 0.9 % SODIUM CHLORIDE (POUR BTL) OPTIME
TOPICAL | Status: DC | PRN
  Administered 2021-03-31: 1000 mL

## 2021-03-31 MED ORDER — LEVOTHYROXINE SODIUM 75 MCG PO TABS
75.0000 ug | ORAL_TABLET | Freq: Every morning | ORAL | Status: DC
  Administered 2021-04-01: 75 ug via ORAL
  Filled 2021-03-31: qty 1

## 2021-03-31 MED ORDER — EPHEDRINE 5 MG/ML INJ
INTRAVENOUS | Status: AC
  Filled 2021-03-31: qty 10

## 2021-03-31 MED ORDER — BUPIVACAINE HCL (PF) 0.5 % IJ SOLN
INTRAMUSCULAR | Status: DC | PRN
  Administered 2021-03-31: 10 mL via PERINEURAL

## 2021-03-31 MED ORDER — BUPROPION HCL ER (XL) 300 MG PO TB24
300.0000 mg | ORAL_TABLET | Freq: Every morning | ORAL | Status: DC
  Administered 2021-04-01: 300 mg via ORAL
  Filled 2021-03-31: qty 1

## 2021-03-31 MED ORDER — MENTHOL 3 MG MT LOZG: 1.0000 | LOZENGE | OROMUCOSAL | Status: DC | PRN

## 2021-03-31 MED ORDER — LACTATED RINGERS IV SOLN: INTRAVENOUS | Status: DC

## 2021-03-31 MED ORDER — DIPHENHYDRAMINE HCL 12.5 MG/5ML PO ELIX: 12.5000 mg | ORAL_SOLUTION | ORAL | Status: DC | PRN

## 2021-03-31 MED ORDER — CEFAZOLIN SODIUM-DEXTROSE 1-4 GM/50ML-% IV SOLN: 1.0000 g | Freq: Four times a day (QID) | INTRAVENOUS | Status: DC

## 2021-03-31 MED ORDER — ONDANSETRON HCL 4 MG/2ML IJ SOLN
INTRAMUSCULAR | Status: AC
  Filled 2021-03-31: qty 2

## 2021-03-31 MED ORDER — BUPIVACAINE LIPOSOME 1.3 % IJ SUSP
INTRAMUSCULAR | Status: DC | PRN
  Administered 2021-03-31: 10 mL via PERINEURAL

## 2021-03-31 MED ORDER — ONDANSETRON HCL 4 MG PO TABS: 4.0000 mg | ORAL_TABLET | Freq: Three times a day (TID) | ORAL | 0 refills | Status: AC | PRN

## 2021-03-31 MED ORDER — CLOPIDOGREL BISULFATE 75 MG PO TABS
75.0000 mg | ORAL_TABLET | Freq: Every day | ORAL | Status: DC
  Administered 2021-04-01: 75 mg via ORAL
  Filled 2021-03-31: qty 1

## 2021-03-31 MED ORDER — LACTATED RINGERS IV BOLUS: 500.0000 mL | Freq: Once | INTRAVENOUS | Status: DC

## 2021-03-31 MED ORDER — SIMVASTATIN 20 MG PO TABS: 20.0000 mg | ORAL_TABLET | Freq: Every day | ORAL | Status: DC

## 2021-03-31 MED ORDER — ACETAMINOPHEN 500 MG PO TABS
500.0000 mg | ORAL_TABLET | Freq: Four times a day (QID) | ORAL | Status: DC
  Administered 2021-03-31 – 2021-04-01 (×3): 500 mg via ORAL
  Filled 2021-03-31 (×3): qty 1

## 2021-03-31 MED ORDER — ACETAMINOPHEN 500 MG PO TABS
1000.0000 mg | ORAL_TABLET | Freq: Once | ORAL | Status: AC
  Administered 2021-03-31: 1000 mg via ORAL
  Filled 2021-03-31: qty 2

## 2021-03-31 SURGICAL SUPPLY — 70 items
AID PSTN UNV HD RSTRNT DISP (MISCELLANEOUS) ×1
APL PRP STRL LF DISP 70% ISPRP (MISCELLANEOUS) ×2
BAG COUNTER SPONGE SURGICOUNT (BAG) ×2 IMPLANT
BAG SPNG CNTER NS LX DISP (BAG) ×1
BASEPLATE GLENOSPHERE 25 STD (Miscellaneous) ×1 IMPLANT
BLADE SAW SAG 73X25 THK (BLADE) ×1
BLADE SAW SGTL 73X25 THK (BLADE) ×1 IMPLANT
BSPLAT GLND STD 25 RVRS SHLDR (Miscellaneous) ×1 IMPLANT
CHLORAPREP W/TINT 26 (MISCELLANEOUS) ×4 IMPLANT
CLSR STERI-STRIP ANTIMIC 1/2X4 (GAUZE/BANDAGES/DRESSINGS) ×2 IMPLANT
COOLER ICEMAN CLASSIC (MISCELLANEOUS) IMPLANT
COVER BACK TABLE 60X90IN (DRAPES) IMPLANT
COVER SURGICAL LIGHT HANDLE (MISCELLANEOUS) ×2 IMPLANT
DRAPE C-ARM 42X120 X-RAY (DRAPES) IMPLANT
DRAPE INCISE IOBAN 66X45 STRL (DRAPES) ×3 IMPLANT
DRAPE ORTHO SPLIT 77X108 STRL (DRAPES) ×4
DRAPE SHEET LG 3/4 BI-LAMINATE (DRAPES) ×4 IMPLANT
DRAPE SURG ORHT 6 SPLT 77X108 (DRAPES) ×2 IMPLANT
DRESSING AQUACEL AG SP 3.5X6 (GAUZE/BANDAGES/DRESSINGS) IMPLANT
DRSG AQUACEL AG ADV 3.5X 6 (GAUZE/BANDAGES/DRESSINGS) ×2 IMPLANT
DRSG AQUACEL AG SP 3.5X6 (GAUZE/BANDAGES/DRESSINGS) ×2
ELECT BLADE TIP CTD 4 INCH (ELECTRODE) ×2 IMPLANT
ELECT REM PT RETURN 15FT ADLT (MISCELLANEOUS) ×2 IMPLANT
FACESHIELD WRAPAROUND (MASK) ×2 IMPLANT
FACESHIELD WRAPAROUND OR TEAM (MASK) ×1 IMPLANT
GLENOSPHERE REV SHOULDER 36 (Joint) ×1 IMPLANT
GLOVE SRG 8 PF TXTR STRL LF DI (GLOVE) ×1 IMPLANT
GLOVE SURG ENC MOIS LTX SZ6.5 (GLOVE) ×4 IMPLANT
GLOVE SURG NEOPR MICRO LF SZ8 (GLOVE) ×4 IMPLANT
GLOVE SURG UNDER POLY LF SZ6.5 (GLOVE) ×2 IMPLANT
GLOVE SURG UNDER POLY LF SZ8 (GLOVE) ×2
GOWN STRL REUS W/TWL LRG LVL3 (GOWN DISPOSABLE) ×2 IMPLANT
GOWN STRL REUS W/TWL XL LVL3 (GOWN DISPOSABLE) ×2 IMPLANT
GUIDEWIRE GLENOID 2.5X220 (WIRE) ×1 IMPLANT
HANDPIECE INTERPULSE COAX TIP (DISPOSABLE) ×2
HEMOSTAT SURGICEL 2X14 (HEMOSTASIS) IMPLANT
IMPL REVERSE SHOULDER 0X3.5 (Shoulder) IMPLANT
IMPLANT REVERSE SHOULDER 0X3.5 (Shoulder) ×2 IMPLANT
INSERT HUMERAL 36X6MM 12.5DEG (Insert) ×1 IMPLANT
KIT BASIN OR (CUSTOM PROCEDURE TRAY) ×2 IMPLANT
KIT STABILIZATION SHOULDER (MISCELLANEOUS) ×2 IMPLANT
KIT TURNOVER KIT A (KITS) IMPLANT
MANIFOLD NEPTUNE II (INSTRUMENTS) ×2 IMPLANT
NDL MAYO CATGUT SZ4 TPR NDL (NEEDLE) IMPLANT
NEEDLE MAYO CATGUT SZ4 (NEEDLE) IMPLANT
NS IRRIG 1000ML POUR BTL (IV SOLUTION) ×2 IMPLANT
PACK SHOULDER (CUSTOM PROCEDURE TRAY) ×2 IMPLANT
PAD COLD SHLDR WRAP-ON (PAD) IMPLANT
RESTRAINT HEAD UNIVERSAL NS (MISCELLANEOUS) ×2 IMPLANT
SCREW 5.5X22 (Screw) ×1 IMPLANT
SCREW BONE 6.5X40 SM (Screw) ×1 IMPLANT
SCREW PERIPHERAL 5.0X34 (Screw) ×1 IMPLANT
SET HNDPC FAN SPRY TIP SCT (DISPOSABLE) ×1 IMPLANT
SLING ULTRA II L (ORTHOPEDIC SUPPLIES) IMPLANT
SLING ULTRA III MED (ORTHOPEDIC SUPPLIES) ×2 IMPLANT
STEM HUMERAL 3B LONG 98 (Stem) IMPLANT
STEM HUMERAL SZ 3B LONG 98MM (Stem) ×2 IMPLANT
SUCTION FRAZIER HANDLE 12FR (TUBING) ×2
SUCTION TUBE FRAZIER 12FR DISP (TUBING) ×1 IMPLANT
SUT ETHIBOND 2 V 37 (SUTURE) ×2 IMPLANT
SUT ETHIBOND NAB CT1 #1 30IN (SUTURE) ×2 IMPLANT
SUT FIBERWIRE #5 38 CONV NDL (SUTURE)
SUT MNCRL AB 4-0 PS2 18 (SUTURE) ×2 IMPLANT
SUT VIC AB 0 CT1 36 (SUTURE) IMPLANT
SUT VIC AB 3-0 SH 27 (SUTURE) ×2
SUT VIC AB 3-0 SH 27X BRD (SUTURE) ×1 IMPLANT
SUTURE FIBERWR #5 38 CONV NDL (SUTURE) IMPLANT
TOWEL OR 17X26 10 PK STRL BLUE (TOWEL DISPOSABLE) ×2 IMPLANT
TUBE SUCTION HIGH CAP CLEAR NV (SUCTIONS) ×2 IMPLANT
WATER STERILE IRR 1000ML POUR (IV SOLUTION) ×4 IMPLANT

## 2021-03-31 NOTE — Progress Notes (Signed)
PHARMACY NOTE:  ANTIMICROBIAL RENAL DOSAGE ADJUSTMENT  Current antimicrobial regimen includes a mismatch between antimicrobial dosage and estimated renal function.  As per policy approved by the Pharmacy & Therapeutics and Medical Executive Committees, the antimicrobial dosage will be adjusted accordingly.  Current antimicrobial dosage:  Cefazolin 1g IV q6h x 3 doses post-operatively   Indication: surgical prophylaxis   Renal Function:  Estimated Creatinine Clearance: 32.6 mL/min (A) (by C-G formula based on SCr of 1.36 mg/dL (H)). []      On intermittent HD, scheduled: []      On CRRT    Antimicrobial dosage has been changed to:  Cefazolin 1g IV q8h x 3 doses post-operatively  Thank you for allowing pharmacy to be a part of this patient's care.  , [facility] 03/31/2021 8:33 PM

## 2021-03-31 NOTE — Anesthesia Procedure Notes (Signed)
Anesthesia Regional Block: Interscalene brachial plexus block   Pre-Anesthetic Checklist: , timeout performed,  Correct Patient, Correct Site, Correct Laterality,  Correct Procedure, Correct Position, site marked,  Risks and benefits discussed,  Surgical consent,  Pre-op evaluation,  At surgeon's request and post-op pain management  Laterality: Right  Prep: Dura Prep       Needles:  Injection technique: Single-shot  Needle Type: Echogenic Stimulator Needle     Needle Length: 5cm  Needle Gauge: 20     Additional Needles:   Procedures:,,,, ultrasound used (permanent image in chart),,    Narrative:  Start time: 03/31/2021 2:53 PM End time: 03/31/2021 2:55 PM Injection made incrementally with aspirations every 5 mL.  Performed by: Personally  Anesthesiologist: Atilano Median, DO  Additional Notes: Patient identified. Risks/Benefits/Options discussed with patient including but not limited to bleeding, infection, nerve damage, failed block, incomplete pain control. Patient expressed understanding and wished to proceed. All questions were answered. Sterile technique was used throughout the entire procedure. Please see nursing notes for vital signs. Aspirated in 5cc intervals with injection for negative confirmation. Patient was given instructions on fall risk and not to get out of bed. All questions and concerns addressed with instructions to call with any issues or inadequate analgesia.

## 2021-03-31 NOTE — Anesthesia Postprocedure Evaluation (Signed)
Anesthesia Post Note  Patient: Laura Fritz  Procedure(s) Performed: REVERSE SHOULDER ARTHROPLASTY (Right: Shoulder)     Patient location during evaluation: PACU Anesthesia Type: Regional and General Level of consciousness: awake and alert Pain management: pain level controlled Vital Signs Assessment: post-procedure vital signs reviewed and stable Respiratory status: spontaneous breathing, nonlabored ventilation, respiratory function stable and patient connected to nasal cannula oxygen Cardiovascular status: blood pressure returned to baseline and stable Postop Assessment: no apparent nausea or vomiting Anesthetic complications: no   No notable events documented.  Last Vitals:  Vitals:   03/31/21 1845 03/31/21 2000  BP: (!) 118/56 127/66  Pulse: 79 77  Resp: 15 18  Temp: 36.5 C 36.7 C  SpO2: 97% 92%    Last Pain:  Vitals:   03/31/21 2000  TempSrc: Oral  PainSc: 0-No pain                 Earl Lites P Desarae Placide

## 2021-03-31 NOTE — Transfer of Care (Signed)
Immediate Anesthesia Transfer of Care Note  Patient: Laura Fritz  Procedure(s) Performed: REVERSE SHOULDER ARTHROPLASTY (Right: Shoulder)  Patient Location: PACU  Anesthesia Type:General  Level of Consciousness: awake  Airway & Oxygen Therapy: Patient Spontanous Breathing  Post-op Assessment: Report given to RN and Post -op Vital signs reviewed and stable  Post vital signs: Reviewed and stable  Last Vitals:  Vitals Value Taken Time  BP 124/81 03/31/21 1818  Temp    Pulse 81 03/31/21 1819  Resp 17 03/31/21 1820  SpO2 100 % 03/31/21 1819  Vitals shown include unvalidated device data.  Last Pain:  Vitals:   03/31/21 1342  PainSc: 6       Patients Stated Pain Goal: 4 (03/31/21 1342)  Complications: No notable events documented.

## 2021-03-31 NOTE — Interval H&P Note (Signed)
All questions answered. We extensively discussed the risks and benefits, including fracture, dislocation, infection, and nerve palsy. Patient expressed understanding.

## 2021-03-31 NOTE — Progress Notes (Signed)
Assisted Dr. Greg Stoltzfus with right, ultrasound guided, interscalene  block. Side rails up, monitors on throughout procedure. See vital signs in flow sheet. Tolerated Procedure well. °

## 2021-03-31 NOTE — Anesthesia Preprocedure Evaluation (Addendum)
Anesthesia Evaluation  Patient identified by MRN, date of birth, ID band Patient awake    Reviewed: Allergy & Precautions, NPO status , Patient's Chart, lab work & pertinent test results  Airway Mallampati: I  TM Distance: >3 FB Neck ROM: Full    Dental  (+) Upper Dentures, Lower Dentures   Pulmonary neg pulmonary ROS, former smoker,    Pulmonary exam normal        Cardiovascular + CAD and + Past MI   Rhythm:Regular Rate:Normal     Neuro/Psych Anxiety Depression  Neuromuscular disease (Parkinson's)    GI/Hepatic GERD  Medicated,(+) Hepatitis -  Endo/Other  Hypothyroidism   Renal/GU negative Renal ROS  negative genitourinary   Musculoskeletal  (+) Arthritis , Osteoarthritis,  Fibromyalgia -, narcotic dependentRight prox humerus fx   Abdominal Normal abdominal exam  (+)   Peds  Hematology   Anesthesia Other Findings   Reproductive/Obstetrics                            Anesthesia Physical Anesthesia Plan  ASA: 3  Anesthesia Plan: General and Regional   Post-op Pain Management: Regional block   Induction: Intravenous  PONV Risk Score and Plan: 3 and Ondansetron, Dexamethasone and Treatment may vary due to age or medical condition  Airway Management Planned: Mask and Oral ETT  Additional Equipment: None  Intra-op Plan:   Post-operative Plan: Extubation in OR  Informed Consent: I have reviewed the patients History and Physical, chart, labs and discussed the procedure including the risks, benefits and alternatives for the proposed anesthesia with the patient or authorized representative who has indicated his/her understanding and acceptance.     Dental advisory given  Plan Discussed with: CRNA  Anesthesia Plan Comments: (Lab Results      Component                Value               Date                      WBC                      7.4                 03/30/2021                 HGB                      14.0                03/30/2021                HCT                      42.6                03/30/2021                MCV                      97.0                03/30/2021                PLT  249                 03/30/2021           Lab Results      Component                Value               Date                      NA                       139                 03/30/2021                K                        3.6                 03/30/2021                CO2                      27                  03/30/2021                GLUCOSE                  112 (H)             03/30/2021                BUN                      18                  03/30/2021                CREATININE               1.36 (H)            03/30/2021                CALCIUM                  9.1                 03/30/2021                GFRNONAA                 40 (L)              03/30/2021          )        Anesthesia Quick Evaluation

## 2021-03-31 NOTE — Anesthesia Procedure Notes (Signed)
Procedure Name: Intubation Date/Time: 03/31/2021 4:47 PM Performed by: Niel Hummer, CRNA Pre-anesthesia Checklist: Patient identified, Emergency Drugs available, Suction available and Patient being monitored Patient Re-evaluated:Patient Re-evaluated prior to induction Oxygen Delivery Method: Circle system utilized Preoxygenation: Pre-oxygenation with 100% oxygen Induction Type: IV induction Ventilation: Mask ventilation without difficulty Laryngoscope Size: Mac and 4 Grade View: Grade I Tube type: Oral Tube size: 7.0 mm Number of attempts: 1 Airway Equipment and Method: Stylet Placement Confirmation: ETT inserted through vocal cords under direct vision, positive ETCO2 and breath sounds checked- equal and bilateral Secured at: 22 cm Tube secured with: Tape Dental Injury: Teeth and Oropharynx as per pre-operative assessment

## 2021-03-31 NOTE — Op Note (Signed)
Orthopaedic Surgery Operative Note (CSN: [id number])  Laura Fritz  13-Dec-1940 Date of Surgery: 03/31/2021   Diagnoses:  Right proximal humerus fracture with head split  Procedure: Right reverse total Shoulder Arthroplasty Right tuberosity open reduction internal fixation   Operative Finding Successful completion of planned procedure.  Patient had an extreme amount of comminution of the articular surface.  There was significant comminution of the capsular attachments of the head as well.  We dissected carefully but we were worried about removing all of the small fragments as we may disrupt the capsule and by virtue of that damage the axillary nerve.  We instead felt that debulking all of this was appropriate.  The actual nerve tug test was intact.  Post-operative plan: The patient will be NWB in sling.  The patient will be will be discharged from PACU if continues to be stable as was plan prior to surgery.  DVT prophylaxis Aspirin 81 mg twice daily for 6 weeks.  Pain control with PRN pain medication preferring oral medicines.  Follow up plan will be scheduled in approximately 7 days for incision check and XR.  Physical therapy to start after 4 weeks.  Implants: Tornier size 3B long stem, 0 high offset tray set to 6:00, 36+6 polyethylene, 36 standard glenosphere with a 25 standard baseplate, 40 center screw  Post-Op Diagnosis: Same Surgeons:Primary: Bjorn Pippin, MD Assistants:Caroline McBane PA-C Location: [facility] ROOM 10 Anesthesia: General with Exparel Interscalene Antibiotics: Ancef 2g preop, Vancomycin 1000mg  locally Tourniquet time: None Estimated Blood Loss: 100 Complications: None Specimens: None Implants: Implant Name Type Inv. Item Serial No. Manufacturer Lot No. LRB No. Used Action  BASEPLATE GLENOSPHERE STD - Miscellaneous BASEPLATE GLENOSPHERE [id number] STD  [employer] Right 1 Implanted  SCREW BONE 6.5X40 SM - [id number] Screw SCREW BONE 6.5X40 SM  [employer]  Right 1 Implanted  SCREW PERIPHERAL 5.0X34 - [id number] Screw SCREW PERIPHERAL 5.0X34  [employer]  Right 1 Implanted  SCREW 5.5X22 - [id number] Screw SCREW 5.5X22  [employer]  Right 1 Implanted  GLENOSPHERE REV SHOULDER 36 - [id number] Joint GLENOSPHERE REV SHOULDER 36  [employer] [id number] Right 1 Implanted  STEM HUMERAL SZ 3B LONG [id number] - Stem STEM HUMERAL SZ 3B LONG [id number]  [employer] Right 1 Implanted  INSERT HUMERAL 36X6MM 12.5DEG - [id number] Insert INSERT HUMERAL 36X6MM [id number] INC [name] Right 1 Implanted  IMPLANT REVERSE SHOULDER 0X3.5 - [id number] Shoulder IMPLANT REVERSE SHOULDER 0X3.5  [employer] [id number] Right 1 Implanted    Indications for Surgery:   Laura Fritz is a 80 y.o. female with fall resulting in comminuted right proximal humerus fracture.  Benefits and risks of operative and nonoperative management were discussed prior to surgery with patient/guardian(s) and informed consent form was completed.  Infection and need for further surgery were discussed as was prosthetic stability and cuff issues.  We additionally specifically discussed risks of axillary nerve injury, infection, periprosthetic fracture, continued pain and longevity of implants prior to beginning procedure.      Procedure:   The patient was identified in the preoperative holding area where the surgical site was marked. Block placed by anesthesia with exparel.  The patient was taken to the OR where a procedural timeout was called and the above noted anesthesia was induced.  The patient was positioned beachchair on allen table with spider arm positioner.  Preoperative antibiotics were dosed.  The patient's right shoulder was prepped and draped in the usual sterile fashion.  A  second preoperative timeout was called.       Standard deltopectoral approach was performed with a #10 blade. We dissected down to the subcutaneous tissues and the cephalic vein was taken laterally with  the deltoid. Clavipectoral fascia was incised in line with the incision. Deep retractors were placed. The long of the biceps tendon was identified and there was significant tenosynovitis present.  Tenodesis was performed to the pectoralis tendon with #2 Ethibond. The remaining biceps was followed up into the rotator interval where it was released.    We used the bicipital groove as a landmark for the lesser and greater tuberosity fragments.  We were able to mobilize the lesser tuberosity fragment and placed stay sutures in the bone tendon junction to help with mobilization.  This point we were able to identify the  greater tuberosity fragment and 4 #5  FiberWire sutures were used to place into this for eventual repair of the tuberosities.  Once these were both mobilized we took care to identify the shaft fragment as well as the head fragment.  We carefully identified the head fragment were able to manually remove it.  At this point the axillary nerve was found and palpated and with a tug test noted to be intact.  Protected throughout the remainder of the case with blunt retractors.    We then released the SGHL with bovie cautery prior to placing a curved mayo at the junction of the anterior glenoid well above the axillary nerve and bluntly dissecting the subscapularis from the capsule.  We then carefully protected the axillary nerve as we gently released the inferior capsule to fully mobilize the subscapularis.  An anterior deltoid retractor was then placed as well as a small Hohmann retractor superiorly.   The glenoid was relatively preserved as we would expect in this fracture patient.  The remaining labrum was removed circumferentially taking great care not to disrupt the posterior capsule.    The glenoid drill guide was placed and used to drill a guide pin in the center, inferior position. The glenoid face was then reamed concentrically over the guide wire. The center hole was drilled over the guidepin  in a near anatomic angle of version. Next the glenoid vault was drilled back to a depth of 40 mm.  We tapped and then placed a 41mm size baseplate with 0 lateralization was selected with a 6.5 mm x 77mm length central screw.  The base plate was screwed into the glenoid vault obtaining secure fixation. We next placed superior and inferior locking screws for additional fixation.  Next a 36 mm glenosphere was selected and impacted onto the baseplate. The center screw was tightened.   We then repositioned the arm to give access to the humeral shaft fragment.  Drill holes were placed and fiberwire sutures in the shaft for vertical fixation of the tuberosities.  We broached starting with a size one broach and broaching up to 3 which obtained an appropriate fit above the pec and we did not feel that cement was necessary to avoid complications for the patient going forward.  We additionally felt that a long stemmed implant would potentially lead to a higher risk of periprosthetic fracture.  We felt that this long standard implant was more appropriate for this patient.   At that point we prepared the humeral shaft for cement.  We irrigated it copiously and then placed a cement restrictor at the appropriate level below the trial stem.  We then placed cement and our number  to size some and held in place for 15 minutes while the cement set.  We achieved good fixation of the component in this fashion.   We trialed with multiple size tray and polyethylene options and selected a 0 high which provided good stability and range of motion without excess soft tissue tension. The offset was dialed in to match the normal anatomy. The shoulder was trialed.  There was good ROM in all planes and the shoulder was stable with no inferior translation.   We then mobilized her tuberosities again and placed the anterior deep limbs of the 4 #5 fiber wires around the stem.  1 of these was tied down fixing the greater tuberosity in place  after bone graft harvest from the humeral head component was placed underneath.  A +0 high offset tray was selected and impacted onto the stem.   A 36+6 polyethylene liner was impacted onto the stem.  The joint was reduced and thoroughly irrigated with pulsatile lavage. The remaining sutures were then placed through the subscapularis and the bone tendon junction and the tuberosities were reduced after bone graft placed beneath as autograft at the subscap.  We horizontally secured the tuberosities before placing vertical fixation with the suture that was placed into the shaft.  Tuberosities moved as a unit were happy with her overall reduction.  This was checked on fluoroscopy confirming our position.  We irrigated copiously at this point.  Hemostasis was obtained. The deltopectoral interval was reapproximated with #1 Ethibond. The subcutaneous tissues were closed with 3-0 Vicryl and the skin was closed with running monocryl.     The wounds were cleaned and dried and an Aquacel dressing was placed. The drapes taken down. The arm was placed into sling with abduction pillow. Patient was awakened, extubated, and transferred to the recovery room in stable condition. There were no intraoperative complications. The sponge, needle, and attention counts were correct at the end of the case.     Alfonse Alpers, PA-C, present and scrubbed throughout the case, critical for completion in a timely fashion, and for retraction, instrumentation, closure.

## 2021-03-31 NOTE — Discharge Instructions (Signed)
Dax Varkey MD, MPH °Sister Carbone, PA-C °[facility] °[address], [address] °[phone] (tel)   °[phone] (fax) ° ° °POST-OPERATIVE INSTRUCTIONS - TOTAL SHOULDER REPLACEMENT  ° ° °WOUND CARE °You may leave the operative dressing in place until your follow-up appointment. °KEEP THE INCISIONS CLEAN AND DRY. °There may be a small amount of fluid/bleeding leaking at the surgical site. This is normal after surgery.  °If it fills with liquid or blood please call us immediately to change it for you. °Use the provided ice machine or Ice packs as often as possible for the first 3-4 days, then as needed for pain relief.  Keep a layer of cloth or a shirt between your skin and the cooling unit to prevent frost bite as it can get very cold. ° °SHOWERING: °- You may shower on Post-Op Day #2.  °- The dressing is water resistant but do not scrub it as it may start to peel up.   °- You may remove the sling for showering, but keep a water resistant pillow under the arm to keep both the  elbow and shoulder away from the body (mimicking the abduction sling).  °- Gently pat the area dry.  °- Do not soak the shoulder in water. Do not go swimming in the pool or ocean until your incision has completely healed (about 4-6 weeks after surgery) °- KEEP THE INCISIONS CLEAN AND DRY. ° °EXERCISES °Wear the sling at all times °You may remove the sling for showering, but keep the arm across the chest or in a secondary sling.    °Accidental/Purposeful External Rotation and shoulder flexion (reaching behind you) is to be avoided at all costs for the first month. °It is ok to come out of your sling if your are sitting and have assistance for eating.   °Do not lift anything heavier than 1 pound until we discuss it further in clinic. ° ° °REGIONAL ANESTHESIA (NERVE BLOCKS) °The anesthesia team may have performed a nerve block for you if safe in the setting of your care.  This is a great tool used to minimize pain.   Typically the block may start wearing off overnight but the long acting medicine may last for 3-4 days.  The nerve block wearing off can be a challenging period but please utilize your as needed pain medications to try and manage this period.   ° °POST-OP MEDICATIONS- Multimodal approach to pain control °In general your pain will be controlled with a combination of substances.  Prescriptions unless otherwise discussed are electronically sent to your pharmacy.  This is a carefully made plan we use to minimize narcotic use.    ° ° °Acetaminophen - Non-narcotic pain medicine taken on a scheduled basis  °Oxycodone - This is a strong narcotic, to be used only on an “as needed” basis for SEVERE pain.  °Zofran -  take as needed for nausea ° ° °FOLLOW-UP °If you develop a Fever (>101.5), Redness or Drainage from the surgical incision site, please call our office to arrange for an evaluation. °Please call the office to schedule a follow-up appointment for a wound check, 7-10 days post-operatively. ° °IF YOU HAVE ANY QUESTIONS, PLEASE FEEL FREE TO CALL OUR OFFICE. ° °HELPFUL INFORMATION ° °If you had a block, it will wear off between 8-24 hrs postop typically.  This is period when your pain may go from nearly zero to the pain you would have had post-op without the block.  This is an abrupt   transition but nothing dangerous is happening.  You may take an extra dose of narcotic when this happens. ° °Your arm will be in a sling following surgery. You will be in this sling for the next 4 weeks.  I will let you know the exact duration at your follow-up visit. ° °You may be more comfortable sleeping in a semi-seated position the first few nights following surgery.  Keep a pillow propped under the elbow and forearm for comfort.  If you have a recliner type of chair it might be beneficial.  If not that is fine too, but it would be helpful to sleep propped up with pillows behind your operated shoulder as well under your elbow and  forearm.  This will reduce pulling on the suture lines. ° °When dressing, put your operative arm in the sleeve first.  When getting undressed, take your operative arm out last.  Loose fitting, button-down shirts are recommended. ° °In most states it is against the law to drive while your arm is in a sling. And certainly against the law to drive while taking narcotics. ° °You may return to work/school in the next couple of days when you feel up to it. Desk work and typing in the sling is fine. ° °We suggest you use the pain medication the first night prior to going to bed, in order to ease any pain when the anesthesia wears off. You should avoid taking pain medications on an empty stomach as it will make you nauseous. ° °Do not drink alcoholic beverages or take illicit drugs when taking pain medications. ° °Pain medication may make you constipated.  Below are a few solutions to try in this order: °Decrease the amount of pain medication if you aren’t having pain. °Drink lots of decaffeinated fluids. °Drink prune juice and/or each dried prunes ° °If the first 3 don’t work start with additional solutions °Take Colace - an over-the-counter stool softener °Take Senokot - an over-the-counter laxative °Take Miralax - a stronger over-the-counter laxative ° ° °Dental Antibiotics: ° °In most cases prophylactic antibiotics for Dental procdeures after total joint surgery are not necessary. ° °Exceptions are as follows: ° °1. History of prior total joint infection ° °2. Severely immunocompromised (Organ Transplant, cancer chemotherapy, Rheumatoid biologic °meds such as Humera) ° °3. Poorly controlled diabetes (A1C &gt; 8.0, blood glucose over 200) ° °If you have one of these conditions, contact your surgeon for an antibiotic prescription, prior to your °dental procedure. ° ° °For more information including helpful videos and documents visit our website:  ° °[email or web address] ° ° ° °

## 2021-04-01 DIAGNOSIS — S42291A Other displaced fracture of upper end of right humerus, initial encounter for closed fracture: Secondary | ICD-10-CM | POA: Diagnosis not present

## 2021-04-01 MED ORDER — SODIUM CHLORIDE 0.9 % IV SOLN: INTRAVENOUS | Status: DC | PRN

## 2021-04-01 MED ORDER — CEFUROXIME AXETIL 500 MG PO TABS
500.0000 mg | ORAL_TABLET | Freq: Two times a day (BID) | ORAL | 0 refills | Status: DC
Start: 2021-04-01 — End: 2023-04-28

## 2021-04-01 NOTE — Progress Notes (Signed)
The patient is alert and oriented and has been seen by her physician. The orders for discharge were written. IV has have been removed. Went over discharge instructions with patient. She is being discharged via wheelchair with all of her belongings.   

## 2021-04-01 NOTE — TOC Transition Note (Signed)
Transition of Care [facility]) - CM/SW Discharge Note   Patient Details  Name: Laura Fritz MRN: [id number] Date of Birth: 1940-07-22  [facility] CM/SW Contact:  Ida Rogue, LCSW Phone Number: 04/01/2021, 1:54 PM   Clinical Narrative:   Patient seen for d/c needs.  [name] is returning to her home with [facility] PT, OT and aide.  Son, with whom I spoke on the phone, is setting up round the clock supervision for her via multiple family members. She is also in need of DME shower stool. Contacted Jasmine with [facility] for delivery of DME to room. Contacted Denyse Amass with [facility], who confirms they will will be able to provide [facility] services. TOC will continue to follow during the course of hospitalization.     Final next level of care: Home w Home Health Services Barriers to Discharge: No Barriers Identified   Patient Goals and CMS Choice        Discharge Placement                       Discharge Plan and Services                                     Social Determinants of Health (SDOH) Interventions     Readmission Risk Interventions No flowsheet data found.

## 2021-04-01 NOTE — Plan of Care (Signed)
  Problem: Education: Goal: Knowledge of General Education information will improve Description: Including pain rating scale, medication(s)/side effects and non-pharmacologic comfort measures Outcome: Progressing   Problem: Health Behavior/Discharge Planning: Goal: Ability to manage health-related needs will improve Outcome: Progressing   Problem: Activity: Goal: Risk for activity intolerance will decrease Outcome: Progressing   

## 2021-04-01 NOTE — Evaluation (Signed)
Physical Therapy Evaluation Patient Details Name: Laura Fritz MRN: [id number] DOB: November 17, 1940 Today's Date: 04/01/2021  History of Present Illness  Patient is a 80 year old female who underwent a right total reverse shoulder replacement on 12/9 as a result of fall on 12/4. PMH: parkinsons, GERD  Clinical Impression  Pt admitted as above and presenting with functional mobility limitations 2* balance deficits now exacerbated by immobilization of R UE.  Pt up to ambulate with noted instability and steadying on walls/doors etc.  Marked improvement with use of hemi-walker (more so than [facility] also trialled) and pt reports feeling much more stable with same.  Pt plans dc home with 24/7 assist of family and states she will order hemi-walker to home to prevent delaying dc.     Recommendations for follow up therapy are one component of a multi-disciplinary discharge planning process, led by the attending physician.  Recommendations may be updated based on patient status, additional functional criteria and insurance authorization.  Follow Up Recommendations Follow physician's recommendations for discharge plan and follow up therapies    Assistance Recommended at Discharge Frequent or constant Supervision/Assistance  Functional Status Assessment Patient has had a recent decline in their functional status and demonstrates the ability to make significant improvements in function in a reasonable and predictable amount of time.  Equipment Recommendations   (Pt prefers hemi-walker but states will order from home to avoid delaying dc)    Recommendations for Other Services       Precautions / Restrictions Precautions Precautions: Shoulder;Fall Type of Shoulder Precautions: NO ROM shoulder Shoulder Interventions: Shoulder sling/immobilizer;At all times;Off for dressing/bathing/exercises Precaution Booklet Issued: Yes (comment) Restrictions Weight Bearing Restrictions: Yes RUE Weight Bearing: Non weight  bearing      Mobility  Bed Mobility Overal bed mobility: Needs Assistance Bed Mobility: Supine to Sit     Supine to sit: Min assist;HOB elevated     General bed mobility comments: Pt up in chair and requests back to same    Transfers Overall transfer level: Needs assistance Equipment used: Hemi-walker;Quad cane Transfers: Sit to/from Stand Sit to Stand: Min guard Stand pivot transfers: Min guard         General transfer comment: Steady assist only    Ambulation/Gait Ambulation/Gait assistance: Min assist;Min guard Gait Distance (Feet): 120 Feet Assistive device: Hemi-walker;Quad cane;None Gait Pattern/deviations: Step-through pattern;Decreased step length - right;Decreased step length - left;Shuffle;Trunk flexed;Wide base of support Gait velocity: decr     General Gait Details: Pt ambulated short distance sans AD but with noted instability and reaching for support on doors etc.  Pt ambulated 43' with [facility] with improvement noted and 41' with hemiwalker and marked improvement in stability.  Stairs Stairs: Yes Stairs assistance: Min assist Stair Management: One rail Left;Step to pattern;Forwards Number of Stairs: 3 General stair comments: min cues for sequence; steady assist  Wheelchair Mobility    Modified Rankin (Stroke Patients Only)       Balance Overall balance assessment: Needs assistance Sitting-balance support: No upper extremity supported;Feet supported Sitting balance-Leahy Scale: Good     Standing balance support: No upper extremity supported Standing balance-Leahy Scale: Fair Standing balance comment: static standing with supervision                             Pertinent Vitals/Pain Pain Assessment: No/denies pain    Home Living Family/patient expects to be discharged to:: Private residence Living Arrangements: Alone Available Help at Discharge: Family;Available 24 hours/day  Type of Home: House Home Access: Stairs to  enter Entrance Stairs-Rails: Right;Left Entrance Stairs-Number of Steps: 2 steps with rail.   Home Layout: One level Home Equipment: Cane - single point Additional Comments: cane in community. "push cart at grocery stores".    Prior Function Prior Level of Function : Independent/Modified Independent             Mobility Comments: Pt states she furniture walks but has been getting more and more unsteady ADLs Comments: patient was independent prior to fall at home. fall on 12/4     Hand Dominance   Dominant Hand: Right    Extremity/Trunk Assessment   Upper Extremity Assessment Upper Extremity Assessment: Defer to OT evaluation RUE Deficits / Details: TSA    Lower Extremity Assessment Lower Extremity Assessment: Overall WFL for tasks assessed;LLE deficits/detail LLE Deficits / Details: Pt with noted bruising on foot from fall - states she had brace but has not been wearing but reports discomfort with WB       Communication   Communication: No difficulties  Cognition Arousal/Alertness: Awake/alert Behavior During Therapy: WFL for tasks assessed/performed Overall Cognitive Status: No family/caregiver present to determine baseline cognitive functioning                                          General Comments      Exercises Donning/doffing shirt without moving shoulder: Patient able to independently direct caregiver Method for sponge bathing under operated UE: Patient able to independently direct caregiver Donning/doffing sling/immobilizer: Patient able to independently direct caregiver Correct positioning of sling/immobilizer: Patient able to independently direct caregiver ROM for elbow, wrist and digits of operated UE: Patient able to independently direct caregiver Sling wearing schedule (on at all times/off for ADL's): Patient able to independently direct caregiver Proper positioning of operated UE when showering: Patient able to independently  direct caregiver Positioning of UE while sleeping: Patient able to independently direct caregiver   Assessment/Plan    PT Assessment Patient needs continued PT services  PT Problem List Decreased balance;Decreased mobility;Decreased knowledge of use of DME;Pain       PT Treatment Interventions DME instruction;Gait training;Stair training;Functional mobility training;Therapeutic activities;Therapeutic exercise;Patient/family education;Balance training    PT Goals (Current goals can be found in the Care Plan section)  Acute Rehab PT Goals Patient Stated Goal: HOME PT Goal Formulation: With patient Time For Goal Achievement: 04/01/21 Potential to Achieve Goals: Good    Frequency Min 3X/week   Barriers to discharge        Co-evaluation               AM-PAC PT "6 Clicks" Mobility  Outcome Measure Help needed turning from your back to your side while in a flat bed without using bedrails?: A Little Help needed moving from lying on your back to sitting on the side of a flat bed without using bedrails?: A Little Help needed moving to and from a bed to a chair (including a wheelchair)?: A Little Help needed standing up from a chair using your arms (e.g., wheelchair or bedside chair)?: A Little Help needed to walk in hospital room?: A Little Help needed climbing 3-5 steps with a railing? : A Little 6 Click Score: 18    End of Session Equipment Utilized During Treatment: Gait belt Activity Tolerance: Patient tolerated treatment well Patient left: in chair;with call bell/phone within reach;with chair alarm  set Nurse Communication: Mobility status PT Visit Diagnosis: Unsteadiness on feet (R26.81);History of falling (Z91.81)    Time: 1425-1450 PT Time Calculation (min) (ACUTE ONLY): 25 min   Charges:   PT Evaluation $PT Eval Low Complexity: 1 Low          Mauro Kaufmann PT Acute Rehabilitation Services Pager [phone] Office  [phone]   Josiyah Tozzi 04/01/2021, 4:28 PM

## 2021-04-01 NOTE — Evaluation (Signed)
Occupational Therapy Evaluation Patient Details Name: Laura Fritz MRN: [id number] DOB: 02/01/1941 Today's Date: 04/01/2021   History of Present Illness Patient is a 80 year old female who underwent a right total reverse shoulder replacement on 12/9 as a result of fall on 12/4. PMH: parkinsons, GERD   Clinical Impression   s/p shoulder replacement without functional use of right dominant upper extremity secondary to effects of surgery and interscalene block and shoulder precautions. Therapist provided education and instruction to patient in regards to exercises, precautions, positioning, donning upper extremity clothing and bathing while maintaining shoulder precautions, ice and edema management and donning/doffing sling. Patient verbalized understanding and demonstrated as needed. Patient needed assistance to donn shirt, underwear, pants, socks and shoes and provided with instruction on compensatory strategies to perform ADLs.patient reported family will provide 24/7 at home with option to higher caregivers as well.  Patient to follow up with MD for further therapy needs.        Recommendations for follow up therapy are one component of a multi-disciplinary discharge planning process, led by the attending physician.  Recommendations may be updated based on patient status, additional functional criteria and insurance authorization.   Follow Up Recommendations  Follow physician's recommendations for discharge plan and follow up therapies    Assistance Recommended at Discharge Frequent or constant Supervision/Assistance  Functional Status Assessment     Equipment Recommendations  Tub/shower seat;Other (comment) (hemi walker)    Recommendations for Other Services       Precautions / Restrictions Precautions Precautions: Shoulder;Fall Type of Shoulder Precautions: NO ROM shoulder Shoulder Interventions: Shoulder sling/immobilizer;At all times;Off for  dressing/bathing/exercises Precaution Booklet Issued: Yes (comment) (handout) Restrictions Weight Bearing Restrictions: Yes RUE Weight Bearing: Non weight bearing      Mobility Bed Mobility Overal bed mobility: Needs Assistance Bed Mobility: Supine to Sit     Supine to sit: Min assist;HOB elevated          Transfers Overall transfer level: Needs assistance Equipment used: Hemi-walker Transfers: Sit to/from Stand;Bed to chair/wheelchair/BSC Sit to Stand: Min guard Stand pivot transfers: Min guard         General transfer comment: with hemi walker      Balance Overall balance assessment: Mild deficits observed, not formally tested                                         ADL either performed or assessed with clinical judgement   ADL Overall ADL's : Needs assistance/impaired Eating/Feeding: Set up;Sitting   Grooming: Wash/dry face;Wash/dry hands;Sitting Grooming Details (indicate cue type and reason): set up EOB. noted to have some tremors in LUE but still able to participate in tasks. Upper Body Bathing: Maximal assistance;Sitting   Lower Body Bathing: Sit to/from stand;Sitting/lateral leans;Moderate assistance   Upper Body Dressing : Maximal assistance;Sitting Upper Body Dressing Details (indicate cue type and reason): in recliner with education on how to don/doff sling and t shirt. Lower Body Dressing: Maximal assistance;Sit to/from stand;Sitting/lateral leans   Toilet Transfer: Min guard;Ambulation;Regular Teacher, adult education Details (indicate cue type and reason): hemi walker Toileting- Clothing Manipulation and Hygiene: Moderate assistance;Sit to/from stand;Sitting/lateral lean Toileting - Clothing Manipulation Details (indicate cue type and reason): patient was able to participate in some hygiene after liquid BM while seated.     Functional mobility during ADLs: Min guard General ADL Comments: with quad cane and increased time  Vision Patient Visual Report: No change from baseline       Perception     Praxis      Pertinent Vitals/Pain Pain Assessment: No/denies pain (nerve block in place at this time. IV on LUE is uncomfortable.)     Hand Dominance Right   Extremity/Trunk Assessment Upper Extremity Assessment Upper Extremity Assessment: RUE deficits/detail RUE Deficits / Details: TSA           Communication Communication Communication: No difficulties   Cognition Arousal/Alertness: Awake/alert Behavior During Therapy: WFL for tasks assessed/performed Overall Cognitive Status: No family/caregiver present to determine baseline cognitive functioning                                       General Comments       Exercises     Shoulder Instructions Shoulder Instructions Donning/doffing shirt without moving shoulder: Patient able to independently direct caregiver Method for sponge bathing under operated UE: Patient able to independently direct caregiver Donning/doffing sling/immobilizer: Patient able to independently direct caregiver Correct positioning of sling/immobilizer: Patient able to independently direct caregiver ROM for elbow, wrist and digits of operated UE: Patient able to independently direct caregiver Sling wearing schedule (on at all times/off for ADL's): Patient able to independently direct caregiver Proper positioning of operated UE when showering: Patient able to independently direct caregiver Positioning of UE while sleeping: Patient able to independently direct caregiver    Home Living Family/patient expects to be discharged to:: Private residence Living Arrangements: Alone Available Help at Discharge: Family;Available 24 hours/day Type of Home: House Home Access: Stairs to enter [employer] of Steps: 2 steps with rail.   Home Layout: One level     Bathroom Shower/Tub: Walk-in shower         Home Equipment: Gilmer Mor - single point    Additional Comments: cane in community. "push cart at grocery stores".      Prior Functioning/Environment               Mobility Comments: no AD for mobility. ADLs Comments: patient was independent prior to fall at home. fall on 12/4        OT Problem List: Impaired UE functional use;Decreased knowledge of precautions;Decreased safety awareness;Impaired balance (sitting and/or standing);Decreased activity tolerance      OT Treatment/Interventions:      OT Goals(Current goals can be found in the care plan section) Acute Rehab OT Goals Patient Stated Goal: to go home today OT Goal Formulation: With patient Time For Goal Achievement: 04/15/21 Potential to Achieve Goals: Good  OT Frequency:     Barriers to D/C:            Co-evaluation              AM-PAC OT "6 Clicks" Daily Activity     Outcome Measure Help from another person eating meals?: A Little Help from another person taking care of personal grooming?: A Little Help from another person toileting, which includes using toliet, bedpan, or urinal?: A Little Help from another person bathing (including washing, rinsing, drying)?: A Lot Help from another person to put on and taking off regular upper body clothing?: A Lot Help from another person to put on and taking off regular lower body clothing?: A Lot 6 Click Score: 15   End of Session Equipment Utilized During Treatment: Gait belt;Other (comment) (hemi walker) Nurse Communication: Mobility status  Activity Tolerance:  Patient left: in chair;with call bell/phone within reach  OT Visit Diagnosis: Unsteadiness on feet (R26.81);Pain Pain - Right/Left: Right Pain - part of body: Shoulder                Time: 7893-8101 OT Time Calculation (min): 63 min Charges:  OT General Charges $OT Visit: 1 Visit OT Evaluation $OT Eval Low Complexity: 1 Low OT Treatments $Self Care/Home Management : 38-52 mins  Sharyn Blitz OTR/L, MS Acute Rehabilitation  Department Office# [phone] Pager# [phone]   Ardyth Harps 04/01/2021, 1:18 PM

## 2021-04-02 NOTE — Discharge Summary (Addendum)
Patient ID: Laura Fritz MRN: [id number] DOB/AGE: 78/03/1941 78 y.o.  Admit date: 03/31/2021 Discharge date: 04/01/21  Admission Diagnoses:  Principal Problem:   Status post reverse total arthroplasty of right shoulder   Discharge Diagnoses:  Same  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HCC)    Parkinson disease (HCC)     Surgeries: Procedure(s): REVERSE SHOULDER ARTHROPLASTY on 03/31/2021   Consultants:   Discharged Condition: Improved  Hospital Course: Laura Fritz is an 80 y.o. female who was admitted 03/31/2021 for operative treatment ofStatus post reverse total arthroplasty of right shoulder. Patient has severe unremitting pain that affects sleep, daily activities, and work/hobbies. After pre-op clearance the patient was taken to the operating room on 03/31/2021 and underwent  Procedure(s): REVERSE SHOULDER ARTHROPLASTY.    Patient was given perioperative antibiotics:  Anti-infectives (From admission, onward)    Start     Dose/Rate Route Frequency Ordered Stop   04/01/21 0000  ceFAZolin (ANCEF) IVPB 1 g/50 mL premix  Status:  Discontinued        1 g 100 mL/hr over 30 Minutes Intravenous Every 8 hours 03/31/21 2032 04/01/21 2137   04/01/21 0000  cefUROXime (CEFTIN) 500 MG tablet        500 mg Oral 2 times daily with meals 04/01/21 1556     03/31/21 2045  ceFAZolin (ANCEF) IVPB 1 g/50 mL premix  Status:  Discontinued        1 g 100 mL/hr over 30 Minutes Intravenous Every 6 hours 03/31/21 1949 03/31/21 2032   03/31/21 1736  vancomycin (VANCOCIN) powder  Status:  Discontinued          As needed 03/31/21 1736 03/31/21 1904   03/31/21 1330  ceFAZolin (ANCEF) IVPB 2g/100 mL premix        2 g 200 mL/hr over 30 Minutes Intravenous On call  to O.R. 03/31/21 1315 03/31/21 1719        Patient was given sequential compression devices, early ambulation, and chemoprophylaxis to prevent DVT.  PATIENT HAD AN OVERNIGHT STAY FOLLOW SURGERY DUE TO LATE TIME OF SURGERY AND SLOW MOBILITY PROGRESSION.  PT AND OT CONSULTS WERE ORDERED TO TEACH HER HOW TO WALK WITH A HEMI WALKER IN THE SETTING OF HER REVERSE TOTAL SHOULDER AND GAIT DISTURBANCE FROM PARKINSONS.  ASLO ORDERED HOME HEALTH PT, OT AND AN AIDE TO ASSIST WITH MOBILITY AND IMPROVE INDEPENDENCE.  Patient benefited maximally from hospital stay and there were no complications.    Recent vital signs: Patient Vitals for the past 24 hrs:  BP Temp Temp src Pulse Resp SpO2  04/01/21 1335 (!) 109/58 98.1 F (36.7 C) Oral 82 17 (!) 88 %     Recent laboratory studies:  Recent Labs    03/30/21 1355  WBC 7.4  HGB 14.0  HCT 42.6  PLT 249  NA 139  K 3.6  CL 103  CO2 27  BUN 18  CREATININE 1.36*  GLUCOSE 112*  CALCIUM 9.1     Discharge Medications:   Allergies as of 04/01/2021       Reactions   Tramadol Anaphylaxis   Oral cavity tremors   Levofloxacin Other (See Comments)   Tendonitis  Nsaids Rash   Sulfa Antibiotics Rash        Medication List     STOP taking these medications    HYDROcodone-acetaminophen 5-325 MG tablet Commonly known as: NORCO/VICODIN       TAKE these medications    acetaminophen 500 MG tablet Commonly known as: TYLENOL Take 2 tablets (1,000 mg total) by mouth every 8 (eight) hours for 14 days.   amphetamine-dextroamphetamine 10 MG tablet Commonly known as: ADDERALL Take 10 mg by mouth in the morning.   buPROPion 300 MG 24 hr tablet Commonly known as: WELLBUTRIN XL Take 300 mg by mouth every morning.   cefUROXime 500 MG tablet Commonly known as: CEFTIN Take 1 tablet (500 mg total) by mouth 2 (two) times daily with a meal. For bladder infection   clopidogrel 75 MG tablet Commonly known as: PLAVIX Take 75 mg by mouth daily.    diphenhydrAMINE 25 MG tablet Commonly known as: BENADRYL Take 25 mg by mouth daily.   escitalopram 10 MG tablet Commonly known as: LEXAPRO Take 10 mg by mouth daily.   gabapentin 300 MG capsule Commonly known as: NEURONTIN Take 900 mg by mouth at bedtime.   levothyroxine 75 MCG tablet Commonly known as: SYNTHROID Take 75 mcg by mouth in the morning.   loperamide 2 MG capsule Commonly known as: IMODIUM Take 2 mg by mouth 4 (four) times daily as needed for diarrhea or loose stools.   nitroGLYCERIN 0.4 MG SL tablet Commonly known as: NITROSTAT Place 0.4 mg under the tongue every 5 (five) minutes as needed for chest pain.   omeprazole 20 MG capsule Commonly known as: PRILOSEC Take 20 mg by mouth in the morning.   ondansetron 4 MG tablet Commonly known as: Zofran Take 1 tablet (4 mg total) by mouth every 8 (eight) hours as needed for up to 7 days for nausea or vomiting.   oxyCODONE 5 MG immediate release tablet Commonly known as: Oxy IR/ROXICODONE Take 1-2 pills every 6 hrs as needed for severe pain, no more than 6 per day   simvastatin 20 MG tablet Commonly known as: ZOCOR Take 20 mg by mouth daily at 6 PM.   traZODone 50 MG tablet Commonly known as: DESYREL Take 50 mg by mouth at bedtime as needed for sleep.   Vitamin D (Ergocalciferol) 1.25 MG (50000 UNIT) Caps capsule Commonly known as: DRISDOL Take 50,000 Units by mouth every 14 (fourteen) days.               Discharge Care Instructions  (From admission, onward)           Start     Ordered   04/01/21 0000  Leave dressing on - Keep it clean, dry, and intact until clinic visit        04/01/21 0747            Diagnostic Studies: DG Shoulder Right  Result Date: 03/26/2021 CLINICAL DATA:  Recent fall with right shoulder pain, initial encounter EXAM: RIGHT SHOULDER - 2+ VIEW COMPARISON:  None. FINDINGS: Comminuted fracture of the proximal humerus is noted with significant impaction at the  fracture site and displacement of the fracture fragments. No definitive dislocation is noted although evaluation of the glenohumeral joint is limited. Degenerative changes of the acromioclavicular joint are seen. IMPRESSION: Comminuted proximal right humeral fracture with impaction at the fracture site. Electronically Signed   By: Alcide Clever M.D.   On: 03/26/2021 21:17   DG Forearm Right  Result Date: 03/27/2021  CLINICAL DATA:  Right elbow pain with known right proximal humeral fracture. EXAM: RIGHT FOREARM - 2 VIEW COMPARISON:  None. FINDINGS: There is no evidence of fracture or other focal bone lesions. Soft tissues are unremarkable. Generalized osteopenia. IMPRESSION: Osteopenia without evidence of fractures. Electronically Signed   By: Almira Bar M.D.   On: 03/27/2021 01:10   CT Head Wo Contrast  Result Date: 03/26/2021 CLINICAL DATA:  Status post fall via stairs EXAM: CT HEAD WITHOUT CONTRAST CT CERVICAL SPINE WITHOUT CONTRAST TECHNIQUE: Multidetector CT imaging of the head and cervical spine was performed following the standard protocol without intravenous contrast. Multiplanar CT image reconstructions of the cervical spine were also generated. COMPARISON:  None. FINDINGS: CT HEAD FINDINGS Brain Cerebral ventricle sizes are concordant with the degree of cerebral volume loss. Patchy and confluent areas of decreased attenuation are noted throughout the deep and periventricular white matter of the cerebral hemispheres bilaterally, compatible with chronic microvascular ischemic disease. No evidence of large-territorial acute infarction. No parenchymal hemorrhage. No mass lesion. No extra-axial collection. No mass effect or midline shift. No hydrocephalus. Basilar cisterns are patent. Vascular: No hyperdense vessel. Skull: No acute fracture or focal lesion. Sinuses/Orbits: Paranasal sinuses and mastoid air cells are clear. Bilateral lens replacement. Otherwise orbits are unremarkable. Other: None. CT  CERVICAL SPINE FINDINGS Alignment: Grade 1 anterolisthesis of C2 on C3 and C3 on C4 likely due to degenerative changes. Straightening of the normal cervical lordosis likely due to degenerative changes and positioning. Skull base and vertebrae: Multilevel degenerative changes of the spine. Associated moderate severe multilevel osseous neural foraminal stenosis. No severe osseous central canal stenosis. No acute fracture. No aggressive appearing focal osseous lesion or focal pathologic process. Soft tissues and spinal canal: No prevertebral fluid or swelling. No visible canal hematoma. Upper chest: Paraseptal emphysematous changes. Other: Carotid artery calcifications within the neck. IMPRESSION: 1. No acute intracranial abnormality. 2. No acute displaced fracture or traumatic listhesis of the cervical spine. 3.  Emphysema (ICD10-J43.9). Electronically Signed   By: Tish Frederickson M.D.   On: 03/26/2021 21:06   CT Cervical Spine Wo Contrast  Result Date: 03/26/2021 CLINICAL DATA:  Status post fall via stairs EXAM: CT HEAD WITHOUT CONTRAST CT CERVICAL SPINE WITHOUT CONTRAST TECHNIQUE: Multidetector CT imaging of the head and cervical spine was performed following the standard protocol without intravenous contrast. Multiplanar CT image reconstructions of the cervical spine were also generated. COMPARISON:  None. FINDINGS: CT HEAD FINDINGS Brain Cerebral ventricle sizes are concordant with the degree of cerebral volume loss. Patchy and confluent areas of decreased attenuation are noted throughout the deep and periventricular white matter of the cerebral hemispheres bilaterally, compatible with chronic microvascular ischemic disease. No evidence of large-territorial acute infarction. No parenchymal hemorrhage. No mass lesion. No extra-axial collection. No mass effect or midline shift. No hydrocephalus. Basilar cisterns are patent. Vascular: No hyperdense vessel. Skull: No acute fracture or focal lesion. Sinuses/Orbits:  Paranasal sinuses and mastoid air cells are clear. Bilateral lens replacement. Otherwise orbits are unremarkable. Other: None. CT CERVICAL SPINE FINDINGS Alignment: Grade 1 anterolisthesis of C2 on C3 and C3 on C4 likely due to degenerative changes. Straightening of the normal cervical lordosis likely due to degenerative changes and positioning. Skull base and vertebrae: Multilevel degenerative changes of the spine. Associated moderate severe multilevel osseous neural foraminal stenosis. No severe osseous central canal stenosis. No acute fracture. No aggressive appearing focal osseous lesion or focal pathologic process. Soft tissues and spinal canal: No prevertebral fluid or swelling. No visible  canal hematoma. Upper chest: Paraseptal emphysematous changes. Other: Carotid artery calcifications within the neck. IMPRESSION: 1. No acute intracranial abnormality. 2. No acute displaced fracture or traumatic listhesis of the cervical spine. 3.  Emphysema (ICD10-J43.9). Electronically Signed   By: Tish Frederickson M.D.   On: 03/26/2021 21:06   CT Shoulder Right Wo Contrast  Result Date: 03/27/2021 CLINICAL DATA:  Re-evaluate shoulder fracture. EXAM: CT OF THE UPPER RIGHT EXTREMITY WITHOUT CONTRAST TECHNIQUE: Multidetector CT imaging of the upper right extremity was performed according to the standard protocol. COMPARISON:  Right shoulder series 03/26/2021. FINDINGS: Bones/Joint/Cartilage Osteopenia. There is comminuted impacted fracture of the proximal right humerus. This consists of a multi fragmented humeral head fracture with spreading of the fragments, largest of these fragments is lateral; and a surgical neck proximal humeral fracture with the main distal fragment impacted up to 2 cm into the fracture bed with mild lateral angulation. The scapula and right clavicle are intact. The [facility] joint is intact. No glenohumeral or AC joint dislocation is seen. There is a slightly elevated distal right clavicle, slight spurring  at the [facility] joint, and mild narrowing and osteophytosis at the sternoclavicular joint. No glenohumeral arthritic changes are visible. There are degenerative changes of the cervical spine without evidence of fractures with additional cervical spine findings described in detail in the cervical spine CT report from yesterday. The visualized right ribs are intact. Ligaments Suboptimally assessed by CT. Muscles and Tendons The rotator cuff muscles are intact. The rotator cuff tendons not well evaluated with CT. The subscapularis tendon is not well seen and could be torn. Remainder of the rotator cuff tendons do not show evidence of a through and through tear. The bicipital tendon appears normally located. Soft tissues There is moderate stranding anteriorly in the shoulder region extending into the anterior upper arm. There is hemarthrosis in the glenohumeral joint space. Right lung fields moderately emphysematous but clear with centrilobular changes predominating. No mass is seen in the visualized right chest wall and axillary space. IMPRESSION: 1. Comminuted impacted proximal humeral fracture with mild lateral angulation of the main distal fragment. 2. Osteopenia and degenerative change without additional fractures. 3. Slightly elevated distal right clavicle, could be within physiologic joint laxity or due to second degree AC separation. 4. The subscapularis tendon not well seen and could be torn. Tendon anatomy otherwise not well evaluated. 5. COPD. Electronically Signed   By: Almira Bar M.D.   On: 03/27/2021 01:37   DG Shoulder Right Port  Result Date: 03/31/2021 CLINICAL DATA:  S/P RIGHT shoulder replacement - image obtained bedside in PACU EXAM: PORTABLE RIGHT SHOULDER COMPARISON:  None. FINDINGS: RIGHT shoulder reverse total arthroplasty. Repair proximal humeral fracture. Fracture plane noted along the stem of the humeral prosthetic. Expected postsurgical change IMPRESSION: RIGHT shoulder arthroplasty for  pair of humeral neck fracture. Electronically Signed   By: Genevive Bi M.D.   On: 03/31/2021 18:56    Disposition: PATIENT HAD AN OVERNIGHT STAY FOLLOW SURGERY DUE TO LATE TIME OF SURGERY AND SLOW MOBILITY PROGRESSION.  PT AND OT CONSULTS WERE ORDERED TO TEACH HER HOW TO WALK WITH A HEMI WALKER IN THE SETTING OF HER REVERSE TOTAL SHOULDER AND GAIT DISTURBANCE FROM PARKINSONS.  ASLO ORDERED HOME HEALTH PT, OT AND AN AIDE TO ASSIST WITH MOBILITY AND IMPROVE INDEPENDENCE.   Discharge Instructions     Call MD for:  redness, tenderness, or signs of infection (pain, swelling, redness, odor or green/yellow discharge around incision site)   Complete by: As  directed    Call MD for:  severe uncontrolled pain   Complete by: As directed    Call MD for:  temperature >100.4   Complete by: As directed    Diet - low sodium heart healthy   Complete by: As directed    Increase activity slowly   Complete by: As directed    Leave dressing on - Keep it clean, dry, and intact until clinic visit   Complete by: As directed         Follow-up Information     Bjorn Pippin, MD Follow up on 04/07/2021.   Specialty: Orthopedic Surgery Why: call office [phone] to confirm follow up Contact information: [address] [place] [address] [phone]         [facility] Follow up.   Specialty: Home Health Services Why: They will call you about setting uyp a time to come out Contact information: [address] [place] [address] [phone]                  Signed: Pascal Lux 04/02/2021, 8:43 AM

## 2021-04-06 ENCOUNTER — Encounter (HOSPITAL_COMMUNITY): Payer: Self-pay | Admitting: Orthopaedic Surgery

## 2021-04-16 ENCOUNTER — Emergency Department (HOSPITAL_BASED_OUTPATIENT_CLINIC_OR_DEPARTMENT_OTHER): Payer: Medicare Other

## 2021-04-16 ENCOUNTER — Emergency Department (HOSPITAL_BASED_OUTPATIENT_CLINIC_OR_DEPARTMENT_OTHER): Payer: Medicare Other | Admitting: Radiology

## 2021-04-16 ENCOUNTER — Other Ambulatory Visit: Payer: Self-pay

## 2021-04-16 ENCOUNTER — Emergency Department (HOSPITAL_BASED_OUTPATIENT_CLINIC_OR_DEPARTMENT_OTHER)
Admission: EM | Admit: 2021-04-16 | Discharge: 2021-04-16 | Disposition: A | Discharge status: Home / Self Care | Payer: Medicare Other | Attending: Emergency Medicine | Admitting: Emergency Medicine

## 2021-04-16 ENCOUNTER — Encounter (HOSPITAL_BASED_OUTPATIENT_CLINIC_OR_DEPARTMENT_OTHER): Payer: Self-pay

## 2021-04-16 DIAGNOSIS — W19XXXA Unspecified fall, initial encounter: Secondary | ICD-10-CM

## 2021-04-16 DIAGNOSIS — R0789 Other chest pain: Secondary | ICD-10-CM | POA: Diagnosis not present

## 2021-04-16 DIAGNOSIS — Z7902 Long term (current) use of antithrombotics/antiplatelets: Secondary | ICD-10-CM | POA: Diagnosis not present

## 2021-04-16 DIAGNOSIS — R410 Disorientation, unspecified: Secondary | ICD-10-CM | POA: Diagnosis not present

## 2021-04-16 DIAGNOSIS — Z87891 Personal history of nicotine dependence: Secondary | ICD-10-CM | POA: Diagnosis not present

## 2021-04-16 DIAGNOSIS — I2510 Atherosclerotic heart disease of native coronary artery without angina pectoris: Secondary | ICD-10-CM | POA: Diagnosis not present

## 2021-04-16 DIAGNOSIS — E039 Hypothyroidism, unspecified: Secondary | ICD-10-CM | POA: Insufficient documentation

## 2021-04-16 DIAGNOSIS — Z79899 Other long term (current) drug therapy: Secondary | ICD-10-CM | POA: Diagnosis not present

## 2021-04-16 DIAGNOSIS — S4991XA Unspecified injury of right shoulder and upper arm, initial encounter: Secondary | ICD-10-CM | POA: Diagnosis present

## 2021-04-16 DIAGNOSIS — M546 Pain in thoracic spine: Secondary | ICD-10-CM | POA: Insufficient documentation

## 2021-04-16 DIAGNOSIS — W01198A Fall on same level from slipping, tripping and stumbling with subsequent striking against other object, initial encounter: Secondary | ICD-10-CM | POA: Diagnosis not present

## 2021-04-16 LAB — I-STAT VENOUS BLOOD GAS, ED
Acid-Base Excess: 2 mmol/L (ref 0.0–2.0)
Bicarbonate: 28.4 mmol/L — ABNORMAL HIGH (ref 20.0–28.0)
Calcium, Ion: 1.28 mmol/L (ref 1.15–1.40)
HCT: 40 % (ref 36.0–46.0)
Hemoglobin: 13.6 g/dL (ref 12.0–15.0)
O2 Saturation: 22 %
Patient temperature: 98.1
Potassium: 3.8 mmol/L (ref 3.5–5.1)
Sodium: 142 mmol/L (ref 135–145)
TCO2: 30 mmol/L (ref 22–32)
pCO2, Ven: 49.1 mmHg (ref 44.0–60.0)
pH, Ven: 7.369 (ref 7.250–7.430)
pO2, Ven: 16 mmHg — CL (ref 32.0–45.0)

## 2021-04-16 LAB — CBC WITH DIFFERENTIAL/PLATELET
Abs Immature Granulocytes: 0.02 10*3/uL (ref 0.00–0.07)
Basophils Absolute: 0.1 10*3/uL (ref 0.0–0.1)
Basophils Relative: 1 %
Eosinophils Absolute: 0.1 10*3/uL (ref 0.0–0.5)
Eosinophils Relative: 1 %
HCT: 40.7 % (ref 36.0–46.0)
Hemoglobin: 12.8 g/dL (ref 12.0–15.0)
Immature Granulocytes: 0 %
Lymphocytes Relative: 32 %
Lymphs Abs: 2.7 10*3/uL (ref 0.7–4.0)
MCH: 31.2 pg (ref 26.0–34.0)
MCHC: 31.4 g/dL (ref 30.0–36.0)
MCV: 99.3 fL (ref 80.0–100.0)
Monocytes Absolute: 0.5 10*3/uL (ref 0.1–1.0)
Monocytes Relative: 6 %
Neutro Abs: 5 10*3/uL (ref 1.7–7.7)
Neutrophils Relative %: 60 %
Platelets: 407 10*3/uL — ABNORMAL HIGH (ref 150–400)
RBC: 4.1 MIL/uL (ref 3.87–5.11)
RDW: 16.1 % — ABNORMAL HIGH (ref 11.5–15.5)
WBC: 8.4 10*3/uL (ref 4.0–10.5)
nRBC: 0 % (ref 0.0–0.2)

## 2021-04-16 LAB — COMPREHENSIVE METABOLIC PANEL
ALT: 9 U/L (ref 0–44)
AST: 15 U/L (ref 15–41)
Albumin: 4.1 g/dL (ref 3.5–5.0)
Alkaline Phosphatase: 120 U/L (ref 38–126)
Anion gap: 8 (ref 5–15)
BUN: 18 mg/dL (ref 8–23)
CO2: 28 mmol/L (ref 22–32)
Calcium: 9.6 mg/dL (ref 8.9–10.3)
Chloride: 104 mmol/L (ref 98–111)
Creatinine, Ser: 1.08 mg/dL — ABNORMAL HIGH (ref 0.44–1.00)
GFR, Estimated: 53 mL/min — ABNORMAL LOW (ref >60)
Glucose, Bld: 96 mg/dL (ref 70–99)
Potassium: 3.9 mmol/L (ref 3.5–5.1)
Sodium: 140 mmol/L (ref 135–145)
Total Bilirubin: 0.6 mg/dL (ref 0.3–1.2)
Total Protein: 7 g/dL (ref 6.5–8.1)

## 2021-04-16 LAB — PROTIME-INR
INR: 1.1 (ref 0.8–1.2)
Prothrombin Time: 13.7 seconds (ref 11.4–15.2)

## 2021-04-16 LAB — RAPID URINE DRUG SCREEN, HOSP PERFORMED
Amphetamines: POSITIVE — AB
Barbiturates: NOT DETECTED
Benzodiazepines: POSITIVE — AB
Cocaine: NOT DETECTED
Opiates: NOT DETECTED
Tetrahydrocannabinol: POSITIVE — AB

## 2021-04-16 LAB — CBG MONITORING, ED: Glucose-Capillary: 92 mg/dL (ref 70–99)

## 2021-04-16 LAB — AMMONIA: Ammonia: 25 umol/L (ref 9–35)

## 2021-04-16 MED ORDER — ACETAMINOPHEN 500 MG PO TABS
1000.0000 mg | ORAL_TABLET | Freq: Once | ORAL | Status: AC
  Administered 2021-04-16: 16:00:00 1000 mg via ORAL
  Filled 2021-04-16: qty 2

## 2021-04-16 NOTE — ED Notes (Signed)
Pt from home with pain in back and right shoulder. Ps had surgery 2 weeks ago and fell last night going to the restroom. Pt has a sling on and it is difficult to maneuver her right arm. Pt alert & oriented, nad noted.

## 2021-04-16 NOTE — ED Notes (Signed)
Pt from home with pain from a fall yesterday. Pt has parkinson's disease and feels "unsteady." Pt alert & oriented, nad noted.

## 2021-04-16 NOTE — ED Triage Notes (Addendum)
Pt c/o fall from standing this morning at approximately 0500. Endorses hitting head during fall. Pt is on Plavix daily. A+Ox4. C/o R arm pain. Pt recently had surgery on same arm for another fall.

## 2021-04-16 NOTE — Discharge Instructions (Signed)
Your imaging today only showed some acromioclavicular joint widening but otherwise your previous injuries look unchanged.  The other imaging was also reassuring.  Your labs were mostly complete but were still in process with the urine.  Please follow-up on this result with your PCP.  Please rest and stay hydrated.  If any symptoms change or worsen acutely, please return to the nearest emergency department.

## 2021-04-16 NOTE — ED Notes (Signed)
Pt in xray

## 2021-04-16 NOTE — ED Notes (Signed)
Pt changed into a gown.

## 2021-04-16 NOTE — ED Provider Notes (Signed)
[facility] EMERGENCY DEPT Provider Note   CSN: [id number] Arrival date & time: 04/16/21  1039     History Chief Complaint  Patient presents with   Lytle Michaels    Laura Fritz is a 80 y.o. female.   Fall Pertinent negatives include no chest pain, no abdominal pain and no shortness of breath.   80 year old female with history of known right humerus fracture managed by orthopedics, currently in a sling, Parkinson's disease who presents to the emergency department after fall.  Patient presented as a nonlevel trauma after fall.  The patient describes a mechanical fall from standing this morning around 0 500.  She did hit her head during the fall.  She takes Plavix daily.  She complains of pain in her right shoulder and right humerus and right elbow.  No loss of consciousness.  Patient arrived GCS 15, AAO x4.  Family members bedside during her time in the emergency department did feel that she was acting somewhat confused.  GCS on reassessment was 14.  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   Chronic RLQ pain 09/07/2020   Chronic diarrhea 09/07/2020   Major depressive disorder, recurrent severe without psychotic features ([place]) 05/14/2016   Opiate dependence ([place]) 05/14/2016    Past Surgical History:  Procedure Laterality Date   ABDOMINAL HYSTERECTOMY     ANTERIOR (CYSTOCELE) AND POSTERIOR REPAIR (RECTOCELE) WITH XENFORM GRAFT AND SACROSPINOUS FIXATION     REVERSE SHOULDER ARTHROPLASTY Right 03/31/2021   Procedure: REVERSE SHOULDER ARTHROPLASTY;  Surgeon: Hiram Gash, MD;   Location: [facility];  Service: Orthopedics;  Laterality: Right;   TUBAL LIGATION       OB History   No obstetric history on file.     Family History  Problem Relation Age of Onset   Lung cancer Mother    Esophageal cancer Maternal Grandfather    Esophageal cancer Cousin    Colon cancer Neg Hx    Pancreatic cancer Neg Hx    Stomach cancer Neg Hx    Liver disease Neg Hx     Social History   Tobacco Use   Smoking status: Former   Smokeless tobacco: Never  Scientific laboratory technician Use: Some days  Substance Use Topics   Alcohol use: Not Currently   Drug use: No    Home Medications Prior to Admission medications   Medication Sig Start Date End Date Taking? Authorizing Provider  amphetamine-dextroamphetamine (ADDERALL) 10 MG tablet Take 10 mg by mouth in the morning. 03/21/21   [provider]  buPROPion (WELLBUTRIN XL) 300 MG 24 hr tablet Take 300 mg by mouth every morning. 02/22/21   [provider]  cefUROXime (CEFTIN) 500 MG tablet Take 1 tablet (500 mg total) by mouth 2 (two) times daily with a meal. For bladder infection 04/01/21   Shepperson, Kirstin, PA-C  clopidogrel (PLAVIX) 75 MG tablet Take 75 mg by mouth daily.    [provider]  diphenhydrAMINE (BENADRYL) 25 MG tablet Take 25 mg  by mouth daily.    [provider]  escitalopram (LEXAPRO) 10 MG tablet Take 10 mg by mouth daily.    [provider]  gabapentin (NEURONTIN) 300 MG capsule Take 900 mg by mouth at bedtime.    [provider]  levothyroxine (SYNTHROID) 75 MCG tablet Take 75 mcg by mouth in the morning.    [provider]  loperamide (IMODIUM) 2 MG capsule Take 2 mg by mouth 4 (four) times daily as needed for diarrhea or loose stools. 03/21/21   [provider]  nitroGLYCERIN (NITROSTAT) 0.4 MG SL tablet Place 0.4 mg under the tongue every 5 (five) minutes as needed for chest pain.    [provider]  omeprazole (PRILOSEC) 20 MG capsule  Take 20 mg by mouth in the morning.    [provider]  simvastatin (ZOCOR) 20 MG tablet Take 20 mg by mouth daily at 6 PM.     [provider]  traZODone (DESYREL) 50 MG tablet Take 50 mg by mouth at bedtime as needed for sleep. 08/31/20   [provider]  Vitamin D, Ergocalciferol, (DRISDOL) 1.25 MG (50000 UNIT) CAPS capsule Take 50,000 Units by mouth every 14 (fourteen) days.    [provider]    Allergies    Tramadol, Levofloxacin, Nsaids, and Sulfa antibiotics  Review of Systems   Review of Systems  Constitutional:  Negative for chills and fever.  HENT:  Negative for ear pain and sore throat.   Eyes:  Negative for pain and visual disturbance.  Respiratory:  Negative for cough and shortness of breath.   Cardiovascular:  Negative for chest pain and palpitations.  Gastrointestinal:  Negative for abdominal pain and vomiting.  Genitourinary:  Negative for dysuria and hematuria.  Musculoskeletal:  Positive for arthralgias. Negative for back pain.  Skin:  Negative for color change and rash.  Neurological:  Negative for seizures and syncope.  All other systems reviewed and are negative.  Physical Exam Updated Vital Signs BP 95/72 (BP Location: Left Arm)    Pulse 97    Temp 98.1 F (36.7 C) (Oral)    Resp 20    Ht 5\' 3"  (1.6 m)    Wt 68 kg    SpO2 96%    BMI 26.57 kg/m   Physical Exam Vitals and nursing note reviewed.  Constitutional:      General: She is not in acute distress.    Appearance: She is well-developed.     Comments: GCS 15, ABC intact  HENT:     Head: Normocephalic and atraumatic.  Eyes:     Extraocular Movements: Extraocular movements intact.     Conjunctiva/sclera: Conjunctivae normal.     Pupils: Pupils are equal, round, and reactive to light.  Neck:     Comments: No midline tenderness to palpation of the cervical spine.  Range of motion intact Cardiovascular:     Rate and Rhythm: Normal rate and regular rhythm.     Heart  sounds: No murmur heard. Pulmonary:     Effort: Pulmonary effort is normal. No respiratory distress.     Breath sounds: Normal breath sounds.  Chest:     Comments: Clavicles stable nontender to AP compression.  Chest wall tender to palpation on the right Abdominal:     Palpations: Abdomen is soft.     Tenderness: There is no abdominal tenderness.  Musculoskeletal:     Cervical back: Neck supple.     Comments: No midline tenderness to palpation of  lumbar spine.  Mid thoracic tenderness to palpation.  Patient in a sling due to known right humerus fracture.  Tenderness palpation about the right shoulder, pain with attempts at range of motion, tenderness palpation about the olecranon and on the lateral epicondyles of the elbow.  Distally neurovascular intact with 2+ radial pulses, intact motor function along the median, ulnar, radial nerve distributions.  Skin:    General: Skin is warm and dry.  Neurological:     Mental Status: She is alert.     Comments: Cranial nerves II through XII grossly intact.  Moving all 4 extremities spontaneously.  Sensation grossly intact all 4 extremities    ED Results / Procedures / Treatments   Labs (all labs ordered are listed, but only abnormal results are displayed) Labs Reviewed - No data to display  EKG None  Radiology CT Head Wo Contrast  Result Date: 04/16/2021 CLINICAL DATA:  Fall from standing.  Patient on Plavix EXAM: CT HEAD WITHOUT CONTRAST CT CERVICAL SPINE WITHOUT CONTRAST TECHNIQUE: Multidetector CT imaging of the head and cervical spine was performed following the standard protocol without intravenous contrast. Multiplanar CT image reconstructions of the cervical spine were also generated. COMPARISON:  03/26/2021 FINDINGS: CT HEAD FINDINGS Brain: No evidence of acute infarction, hemorrhage, hydrocephalus, extra-axial collection or mass lesion/mass effect. Moderate low-density changes within the periventricular and subcortical white matter  compatible with chronic microvascular ischemic change. Mild diffuse cerebral volume loss. Vascular: No hyperdense vessel or unexpected calcification. Skull: Normal. Negative for fracture or focal lesion. Sinuses/Orbits: No acute finding. Other: Negative for scalp hematoma. CT CERVICAL SPINE FINDINGS Alignment: Facet joints are aligned without dislocation or traumatic listhesis. Dens and lateral masses are aligned. Straightening of the cervical lordosis without significant listhesis. Skull base and vertebrae: No acute fracture. No primary bone lesion or focal pathologic process. Soft tissues and spinal canal: No prevertebral fluid or swelling. No visible canal hematoma. Disc levels: Moderate multilevel cervical spondylosis, without interval progression from prior. Upper chest: Emphysematous changes within the visualized lung apices. Other: None. IMPRESSION: 1. No acute intracranial findings. 2. No evidence of acute fracture or traumatic listhesis of the cervical spine. 3. Moderate multilevel cervical spondylosis. Electronically Signed   By: Davina Poke D.O.   On: 04/16/2021 13:18   CT Cervical Spine Wo Contrast  Result Date: 04/16/2021 CLINICAL DATA:  Fall from standing.  Patient on Plavix EXAM: CT HEAD WITHOUT CONTRAST CT CERVICAL SPINE WITHOUT CONTRAST TECHNIQUE: Multidetector CT imaging of the head and cervical spine was performed following the standard protocol without intravenous contrast. Multiplanar CT image reconstructions of the cervical spine were also generated. COMPARISON:  03/26/2021 FINDINGS: CT HEAD FINDINGS Brain: No evidence of acute infarction, hemorrhage, hydrocephalus, extra-axial collection or mass lesion/mass effect. Moderate low-density changes within the periventricular and subcortical white matter compatible with chronic microvascular ischemic change. Mild diffuse cerebral volume loss. Vascular: No hyperdense vessel or unexpected calcification. Skull: Normal. Negative for fracture  or focal lesion. Sinuses/Orbits: No acute finding. Other: Negative for scalp hematoma. CT CERVICAL SPINE FINDINGS Alignment: Facet joints are aligned without dislocation or traumatic listhesis. Dens and lateral masses are aligned. Straightening of the cervical lordosis without significant listhesis. Skull base and vertebrae: No acute fracture. No primary bone lesion or focal pathologic process. Soft tissues and spinal canal: No prevertebral fluid or swelling. No visible canal hematoma. Disc levels: Moderate multilevel cervical spondylosis, without interval progression from prior. Upper chest: Emphysematous changes within the visualized lung apices. Other: None. IMPRESSION: 1. No acute intracranial  findings. 2. No evidence of acute fracture or traumatic listhesis of the cervical spine. 3. Moderate multilevel cervical spondylosis. Electronically Signed   By: Duanne Guess D.O.   On: 04/16/2021 13:18    Procedures Procedures   Medications Ordered in ED Medications - No data to display  ED Course  I have reviewed the triage vital signs and the nursing notes.  Pertinent labs & imaging results that were available during my care of the patient were reviewed by me and considered in my medical decision making (see chart for details).    MDM Rules/Calculators/A&P                          80 year old female with history of known right humerus fracture managed by orthopedics, currently in a sling, Parkinson's disease who presents to the emergency department after fall.  Patient presented as a nonlevel trauma after fall.  The patient describes a mechanical fall from standing this morning around 0 500.  She did hit her head during the fall.  She takes Plavix daily.  She complains of pain in her right shoulder and right humerus and right elbow.  No loss of consciousness.  Patient arrived GCS 15, AAO x4.  Family members bedside during her time in the emergency department did feel that she was acting somewhat  confused.  GCS on reassessment was 14.  Vitals on arrival were stable.  Physical exam significant for right upper extremity tenderness to palpation from the shoulder down through the elbow.  She was distally neurovascularly intact.  She does have some confusion after fall per family members is not quite at baseline.  No endorsing a headache and is neurologically intact.  Had some mid thoracic tenderness to palpation and some right chest wall tenderness.  Given her fall on Plavix, will assess further with trauma imaging and labs given her mental status change with some confusion.  An EKG was performed which revealed a prolonged QT interval to 535, otherwise unremarkable.  Patient denies any palpitations, left-sided chest discomfort, syncopal episode.  CT imaging of the head and cervical spine was performed which revealed no acute intracranial abnormality, no evidence of intracranial hemorrhage, no fracture or malalignment of the cervical spine.  CT of the chest and T-spine, x-ray imaging of the right shoulder, right elbow, humerus and right forearm were all pending at time of signout.  Plan at time of signout to follow-up patient's screening laboratory work-up and CT and x-ray imaging.  Plan will be to reassess the patient following imaging.  Signout given to Dr. Rush Landmark at [phone].   Final Clinical Impression(s) / ED Diagnoses Final diagnoses:  None    Rx / DC Orders ED Discharge Orders     None        Ernie Avena, MD 04/17/21 [phone]

## 2021-04-16 NOTE — ED Notes (Signed)
Patient refused to change into gown or get onto stretcher

## 2021-04-16 NOTE — ED Provider Notes (Signed)
3:42 PM Care assumed from Dr. Karene Fry. Pt awaiting results of CTs and XRays. PT had fall lastnight on plavis. Plan of care is to reassess after imaging is completing.   Imaging overall reassuring.  Patient's x-ray did show possible AC separation and patient is going to follow-up with her orthopedist from the recent injury.  They agreed to plan of care and had other questions or concerns and was discharged in good condition.  Clinical Impression: 1. Fall, initial encounter   2. Injury of right acromioclavicular joint, initial encounter     Disposition: Discharge  Condition: Good  I have discussed the results, Dx and Tx plan with the pt(& family if present). He/she/they expressed understanding and agree(s) with the plan. Discharge instructions discussed at great length. Strict return precautions discussed and pt &/or family have verbalized understanding of the instructions. No further questions at time of discharge.    Discharge Medication List as of 04/16/2021  6:38 PM      Follow Up: your orthopedist     [facility] [address] [place] [address] [phone]    Rebecka Apley, NP [address] [place] [address] [phone]        Santanna Olenik, Canary Brim, MD 04/17/21 [phone]

## 2021-04-16 NOTE — ED Notes (Signed)
Pt discharged home after verbalizing understanding of discharge instructions; nad noted. 

## 2021-04-16 NOTE — ED Notes (Signed)
Pt to restroom for urine sample

## 2022-04-10 ENCOUNTER — Emergency Department (HOSPITAL_BASED_OUTPATIENT_CLINIC_OR_DEPARTMENT_OTHER): Payer: Medicare Other | Admitting: Radiology

## 2022-04-10 ENCOUNTER — Other Ambulatory Visit: Payer: Self-pay

## 2022-04-10 ENCOUNTER — Encounter (HOSPITAL_BASED_OUTPATIENT_CLINIC_OR_DEPARTMENT_OTHER): Payer: Self-pay | Admitting: Emergency Medicine

## 2022-04-10 ENCOUNTER — Emergency Department (HOSPITAL_BASED_OUTPATIENT_CLINIC_OR_DEPARTMENT_OTHER)
Admission: EM | Admit: 2022-04-10 | Discharge: 2022-04-10 | Disposition: A | Discharge status: Home / Self Care | Payer: Medicare Other | Attending: Emergency Medicine | Admitting: Emergency Medicine

## 2022-04-10 DIAGNOSIS — W19XXXA Unspecified fall, initial encounter: Secondary | ICD-10-CM

## 2022-04-10 DIAGNOSIS — R079 Chest pain, unspecified: Secondary | ICD-10-CM | POA: Diagnosis present

## 2022-04-10 DIAGNOSIS — W06XXXA Fall from bed, initial encounter: Secondary | ICD-10-CM | POA: Diagnosis not present

## 2022-04-10 DIAGNOSIS — R0789 Other chest pain: Secondary | ICD-10-CM | POA: Insufficient documentation

## 2022-04-10 LAB — CBC
HCT: 38.8 % (ref 36.0–46.0)
Hemoglobin: 12.9 g/dL (ref 12.0–15.0)
MCH: 31.9 pg (ref 26.0–34.0)
MCHC: 33.2 g/dL (ref 30.0–36.0)
MCV: 96 fL (ref 80.0–100.0)
Platelets: 211 10*3/uL (ref 150–400)
RBC: 4.04 MIL/uL (ref 3.87–5.11)
RDW: 13.3 % (ref 11.5–15.5)
WBC: 6.9 10*3/uL (ref 4.0–10.5)
nRBC: 0 % (ref 0.0–0.2)

## 2022-04-10 LAB — BASIC METABOLIC PANEL
Anion gap: 10 (ref 5–15)
BUN: 12 mg/dL (ref 8–23)
CO2: 21 mmol/L — ABNORMAL LOW (ref 22–32)
Calcium: 9.3 mg/dL (ref 8.9–10.3)
Chloride: 110 mmol/L (ref 98–111)
Creatinine, Ser: 1.02 mg/dL — ABNORMAL HIGH (ref 0.44–1.00)
GFR, Estimated: 56 mL/min — ABNORMAL LOW (ref >60)
Glucose, Bld: 108 mg/dL — ABNORMAL HIGH (ref 70–99)
Potassium: 3.7 mmol/L (ref 3.5–5.1)
Sodium: 141 mmol/L (ref 135–145)

## 2022-04-10 LAB — PROTIME-INR
INR: 1 (ref 0.8–1.2)
Prothrombin Time: 13.3 seconds (ref 11.4–15.2)

## 2022-04-10 LAB — TROPONIN I (HIGH SENSITIVITY): Troponin I (High Sensitivity): 2 ng/L (ref <18)

## 2022-04-10 MED ORDER — LIDOCAINE 5 % EX PTCH: 1.0000 | MEDICATED_PATCH | CUTANEOUS | 0 refills | Status: DC

## 2022-04-10 MED ORDER — LIDOCAINE 5 % EX PTCH
1.0000 | MEDICATED_PATCH | CUTANEOUS | Status: DC
  Administered 2022-04-10: 1 via TRANSDERMAL
  Filled 2022-04-10: qty 1

## 2022-04-10 NOTE — ED Provider Notes (Signed)
[facility]   CSN: [id number] Arrival date & time: 04/10/22  1320     History  Chief Complaint  Patient presents with   Marletta LorFall    Laura Fritz is a 81 y.o. female.  Patient presents to the emergency department today for evaluation of right-sided chest pain.  Symptoms began after she had a fall 5 days ago at home.  She states that she was getting out of bed when she fell.  She has a stool that she stands on to help her get in of and out of bed.  Somehow she fell into a nightstand into a drawer that was open.  She states that for the first 2 days she was feeling relatively well but developed pain in the right chest on the third day.  This is much worse with palpation and deep breathing.  She denies lightheadedness or syncope.  She did not hit her head or hurt her neck.  No lower extremity pain or hip pain.  She did try taking ibuprofen once which seemed to help a bit.  No cough or fevers.  Her breathing is okay.  Pain is better now after arrival to the emergency department than it was earlier today.  Patient does note that she is getting on an airplane to fly to [place] in 2 days.       Home Medications Prior to Admission medications   Medication Sig Start Date End Date Taking? Authorizing Provider  lidocaine (LIDODERM) 5 % Place 1 patch onto the skin daily. Remove & Discard patch within 12 hours or as directed by MD 04/10/22  Yes Renne CriglerGeiple, Imri Lor, PA-C  amphetamine-dextroamphetamine (ADDERALL) 10 MG tablet Take 10 mg by mouth in the morning. 03/21/21   [provider]  buPROPion (WELLBUTRIN XL) 300 MG 24 hr tablet Take 300 mg by mouth every morning. 02/22/21   [provider]  cefUROXime (CEFTIN) 500 MG tablet Take 1 tablet (500 mg total) by mouth 2 (two) times daily with a meal. For bladder infection 04/01/21   Shepperson, Kirstin, PA-C  Cholecalciferol (VITAMIN D3) 1.25 MG (50000 UT) CAPS Take 1 capsule by mouth once a week.  01/19/22   [provider]  clopidogrel (PLAVIX) 75 MG tablet Take 75 mg by mouth daily.    [provider]  diphenhydrAMINE (BENADRYL) 25 MG tablet Take 25 mg by mouth daily.    [provider]  escitalopram (LEXAPRO) 10 MG tablet Take 10 mg by mouth daily.    [provider]  gabapentin (NEURONTIN) 300 MG capsule Take 900 mg by mouth at bedtime.    [provider]  levothyroxine (SYNTHROID) 50 MCG tablet Take 50 mcg by mouth daily. 03/27/22   [provider]  levothyroxine (SYNTHROID) 75 MCG tablet Take 75 mcg by mouth in the morning.    [provider]  loperamide (IMODIUM) 2 MG capsule Take 2 mg by mouth 4 (four) times daily as needed for diarrhea or loose stools. 03/21/21   [provider]  nitroGLYCERIN (NITROSTAT) 0.4 MG SL tablet Place 0.4 mg under the tongue every 5 (five) minutes as needed for chest pain.    [provider]  omeprazole (PRILOSEC) 20 MG capsule Take 20 mg by mouth in the morning.    [provider]  rosuvastatin (CRESTOR) 20 MG tablet Take 20 mg by mouth at bedtime. 01/12/22   [provider]  simvastatin (ZOCOR) 20 MG tablet Take 20 mg by mouth daily at 6  PM.     [provider]  traZODone (DESYREL) 50 MG tablet Take 50 mg by mouth at bedtime as needed for sleep. 08/31/20   [provider]  Vitamin D, Ergocalciferol, (DRISDOL) 1.25 MG (50000 UNIT) CAPS capsule Take 50,000 Units by mouth every 14 (fourteen) days.    [provider]      Allergies    Tramadol, Levofloxacin, Nsaids, and Sulfa antibiotics    Review of Systems   Review of Systems  Physical Exam Updated Vital Signs BP 119/87   Pulse 69   Temp 98 F (36.7 C) (Temporal)   Resp 18   Ht 5\' 3"  (1.6 m)   Wt 68 kg   SpO2 97%   BMI 26.57 kg/m  Physical Exam Vitals and nursing note reviewed.  Constitutional:      General: She is not in acute distress.    Appearance: She is  well-developed.  HENT:     Head: Normocephalic and atraumatic.     Right Ear: External ear normal.     Left Ear: External ear normal.     Nose: Nose normal.  Eyes:     Conjunctiva/sclera: Conjunctivae normal.  Cardiovascular:     Rate and Rhythm: Normal rate and regular rhythm.     Heart sounds: No murmur heard. Pulmonary:     Effort: No respiratory distress.     Breath sounds: No wheezing, rhonchi or rales.     Comments: Lungs are clear to auscultation bilaterally. Chest:     Chest wall: Tenderness present.       Comments: Patient winces when I palpate over the right sternal border.  No deformities or contusions noted. Abdominal:     Palpations: Abdomen is soft.     Tenderness: There is no abdominal tenderness. There is no guarding or rebound.  Musculoskeletal:     Cervical back: Normal range of motion and neck supple.     Right lower leg: No edema.     Left lower leg: No edema.     Comments: No tenderness to palpation over the pelvic rim.  Skin:    General: Skin is warm and dry.     Findings: No rash.  Neurological:     General: No focal deficit present.     Mental Status: She is alert. Mental status is at baseline.     Motor: No weakness.  Psychiatric:        Mood and Affect: Mood normal.     ED Results / Procedures / Treatments   Labs (all labs ordered are listed, but only abnormal results are displayed) Labs Reviewed  BASIC METABOLIC PANEL - Abnormal; Notable for the following components:      Result Value   CO2 21 (*)    Glucose, Bld 108 (*)    Creatinine, Ser 1.02 (*)    GFR, Estimated 56 (*)    All other components within normal limits  CBC  PROTIME-INR  TROPONIN I (HIGH SENSITIVITY)    ED ECG REPORT   Date: 04/10/2022  Rate: 80  Rhythm: normal sinus rhythm  QRS Axis: normal  Intervals: normal  ST/T Wave abnormalities: nonspecific ST/T changes  Conduction Disutrbances:none  Narrative Interpretation: degraded baseline  Old EKG Reviewed:  unchanged  I have personally reviewed the EKG tracing and agree with the computerized printout as noted.   Radiology DG Chest 2 View  Result Date: 04/10/2022 CLINICAL DATA:  Right chest and upper back pain after fall 5 days ago. EXAM: CHEST -  2 VIEW COMPARISON:  Chest two views 05/07/2016, CT chest 04/16/2021 FINDINGS: Cardiac silhouette and mediastinal contours are within normal limits. Moderate calcification seen within aortic arch. Increased lucencies are again seen within the superior greater than inferior lungs corresponding to the emphysematous changes on prior CT. There is flattening of the diaphragms and moderate hyperinflation. No focal airspace opacity to indicate pneumonia. No pleural effusion or pneumothorax. Mild levocurvature of the thoracic spine. 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 IMPRESSION: 1. No acute cardiopulmonary disease process. 2. Chronic emphysematous changes. 3. 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 Electronically Signed   By: Neita Garnet M.D.   On: 04/10/2022 14:20    Procedures Procedures    Medications Ordered in ED Medications  lidocaine (LIDODERM) 5 % 1 patch (1 patch Transdermal Patch Applied 04/10/22 1629)    ED Course/ Medical Decision Making/ A&P    Patient seen and examined. History obtained directly from patient.  There is also a family member at bedside.  Work-up including labs, imaging, EKG ordered in triage, if performed, were reviewed.    Labs/EKG: Independently reviewed and interpreted.  This included: EKG, no ischemic findings; BMP largely unremarkable; CBC unremarkable; troponin of 2 normal; PT/INR 1.0.  Imaging: Independently reviewed and interpreted.  This included: Chest x-ray, reviewed at bedside, agree no traumatic injury or pneumonia, fracture or other acute findings noted.  Medications/Fluids: Ordered: Lidoderm patch  Most recent  vital signs reviewed and are as follows: BP 119/87   Pulse 69   Temp 98 F (36.7 C) (Temporal)   Resp 18   Ht 5\' 3"  (1.6 m)   Wt 68 kg   SpO2 97%   BMI 26.57 kg/m   Initial impression: Likely chest wall pain  I discussed with patient potential options on how to proceed.  Family member bedside also included in these conversations.  I did offer CT in order to evaluate for the possibility of occult rib fracture and to ensure no underlying lung injury.  Also discussed in setting of upcoming airplane flight.  We discussed continued treatment with medication such as ibuprofen, Tylenol, Lidoderm patch.  Patient explicitly states that she is reassured that she does not have a pneumothorax.  When offered CT, she states that she would prefer to wait a couple of days to see how she responds to the medications and does not want to have a CT scan right away.  She states that she is considering delaying her trip to .  She seems willing to follow-up with her primary care doctor as well.  We discussed that if she feels worse or changes her mind, she can return at any time for consideration of further workup.  Home treatment plan: Rest, breathing exercises, Lidoderm patch  Return instructions discussed with patient: Uncontrolled pain, trouble breathing/shortness of breath  Follow-up instructions discussed with patient: Follow-up with PCP in the next 1 week for recheck.                          Medical Decision Making Amount and/or Complexity of Data Reviewed Labs: ordered. Radiology: ordered.  Risk Prescription drug management.   Patient presents after a mechanical type fall 5 days ago.  She presents for evaluation of chest wall pain.  This is worse when she takes a deep breath and she is exquisitely tender on exam.  Her vital signs are reassuring.  She is not hypoxic or tachycardic.  Her blood  pressure is normal.  Lab workup does not suggest any signs of ischemia and I have low concern for PE  at this time.  There is a possibility of an occult rib fracture, but patient declines CT imaging today after discussion with her.  Family is comfortable with monitoring her.  Will focus on symptom control at this time, per wishes of patient.  Low concern for head or neck injury.  No signs of hip fracture as patient is ambulatory and walking well.  The patient's vital signs, pertinent lab work and imaging were reviewed and interpreted as discussed in the ED course. Hospitalization was considered for further testing, treatments, or serial exams/observation. However as patient is well-appearing, has a stable exam, and reassuring studies today, I do not feel that they warrant admission at this time. This plan was discussed with the patient who verbalizes agreement and comfort with this plan and seems reliable and able to return to the Emergency Department with worsening or changing symptoms.          Final Clinical Impression(s) / ED Diagnoses Final diagnoses:  Chest wall pain  Fall, initial encounter    Rx / DC Orders ED Discharge Orders          Ordered    lidocaine (LIDODERM) 5 %  Every 24 hours        04/10/22 1644              Renne Crigler, PA-C 04/10/22 1656    Elayne Snare K, DO 04/11/22 0012

## 2022-04-10 NOTE — ED Triage Notes (Signed)
Pt via pov from home with pain in her right chest and upper back after a fall 5 days ago. Pt reports that she didn't have any pain for 2 days and that it began to be sore 3 days ago. She states the pain is much worse today and that it is especially sore when she is inhaling. Pt states she is taking 800 mg ibuprofen for the pain and that helps. Pt alert & oriented, nad noted.

## 2022-04-10 NOTE — Discharge Instructions (Addendum)
Please read and follow all provided instructions.  Your diagnoses today include:  1. Chest wall pain   2. Fall, initial encounter     Tests performed today include: An EKG of your heart: no concerning findings A chest x-ray: no broken bones or other acute problems Cardiac enzymes - a blood test for heart muscle damage, do not suggest heart attack Blood counts and electrolytes Vital signs. See below for your results today.   Medications prescribed:  Lidoderm patch  Take any prescribed medications only as directed.  Follow-up instructions: Please follow-up with your primary care provider as soon as you can for further evaluation of your symptoms.   Return instructions:  SEEK IMMEDIATE MEDICAL ATTENTION IF: You have severe chest pain, especially if the pain is crushing or pressure-like and spreads to the arms, back, neck, or jaw, or if you have sweating, nausea or vomiting, or trouble with breathing. THIS IS AN EMERGENCY. Do not wait to see if the pain will go away. Get medical help at once. Call 911. DO NOT drive yourself to the hospital.  Your chest pain gets worse and does not go away after a few minutes of rest.  You have an attack of chest pain lasting longer than what you usually experience.  You have significant dizziness, if you pass out, or have trouble walking.  You have chest pain not typical of your usual pain for which you originally saw your caregiver.  You have any other emergent concerns regarding your health.  Additional Information: Chest pain comes from many different causes. Your caregiver has diagnosed you as having chest pain that is not specific for one problem, but does not require admission.  You are at low risk for an acute heart condition or other serious illness.   Your vital signs today were: BP 130/78   Pulse 70   Temp 98 F (36.7 C) (Temporal)   Resp 19   Ht 5\' 3"  (1.6 m)   Wt 68 kg   SpO2 93%   BMI 26.57 kg/m  If your blood pressure (BP) was  elevated above 135/85 this visit, please have this repeated by your doctor within one month. --------------

## 2023-04-28 ENCOUNTER — Emergency Department (HOSPITAL_COMMUNITY): Payer: Medicare Other

## 2023-04-28 ENCOUNTER — Inpatient Hospital Stay (HOSPITAL_COMMUNITY)
Admission: EM | Admit: 2023-04-28 | Discharge: 2023-05-03 | DRG: 312 | Disposition: A | Discharge status: Skilled Nursing Facility | Payer: Medicare Other | Attending: Internal Medicine | Admitting: Internal Medicine

## 2023-04-28 DIAGNOSIS — G903 Multi-system degeneration of the autonomic nervous system: Secondary | ICD-10-CM | POA: Diagnosis not present

## 2023-04-28 DIAGNOSIS — Z885 Allergy status to narcotic agent status: Secondary | ICD-10-CM

## 2023-04-28 DIAGNOSIS — F32A Depression, unspecified: Secondary | ICD-10-CM | POA: Diagnosis present

## 2023-04-28 DIAGNOSIS — Z79890 Hormone replacement therapy (postmenopausal): Secondary | ICD-10-CM

## 2023-04-28 DIAGNOSIS — E7800 Pure hypercholesterolemia, unspecified: Secondary | ICD-10-CM | POA: Diagnosis present

## 2023-04-28 DIAGNOSIS — E860 Dehydration: Secondary | ICD-10-CM | POA: Diagnosis present

## 2023-04-28 DIAGNOSIS — I252 Old myocardial infarction: Secondary | ICD-10-CM

## 2023-04-28 DIAGNOSIS — Z66 Do not resuscitate: Secondary | ICD-10-CM | POA: Diagnosis present

## 2023-04-28 DIAGNOSIS — G479 Sleep disorder, unspecified: Secondary | ICD-10-CM

## 2023-04-28 DIAGNOSIS — Z882 Allergy status to sulfonamides status: Secondary | ICD-10-CM

## 2023-04-28 DIAGNOSIS — R296 Repeated falls: Secondary | ICD-10-CM | POA: Diagnosis present

## 2023-04-28 DIAGNOSIS — Z886 Allergy status to analgesic agent status: Secondary | ICD-10-CM

## 2023-04-28 DIAGNOSIS — Z881 Allergy status to other antibiotic agents status: Secondary | ICD-10-CM

## 2023-04-28 DIAGNOSIS — J9601 Acute respiratory failure with hypoxia: Secondary | ICD-10-CM | POA: Diagnosis present

## 2023-04-28 DIAGNOSIS — I2510 Atherosclerotic heart disease of native coronary artery without angina pectoris: Secondary | ICD-10-CM | POA: Diagnosis present

## 2023-04-28 DIAGNOSIS — F419 Anxiety disorder, unspecified: Secondary | ICD-10-CM | POA: Diagnosis present

## 2023-04-28 DIAGNOSIS — G20A1 Parkinson's disease without dyskinesia, without mention of fluctuations: Secondary | ICD-10-CM | POA: Diagnosis present

## 2023-04-28 DIAGNOSIS — G8929 Other chronic pain: Secondary | ICD-10-CM | POA: Diagnosis present

## 2023-04-28 DIAGNOSIS — R55 Syncope and collapse: Secondary | ICD-10-CM | POA: Diagnosis not present

## 2023-04-28 DIAGNOSIS — Z96611 Presence of right artificial shoulder joint: Secondary | ICD-10-CM | POA: Diagnosis present

## 2023-04-28 DIAGNOSIS — G252 Other specified forms of tremor: Secondary | ICD-10-CM | POA: Diagnosis present

## 2023-04-28 DIAGNOSIS — Z79899 Other long term (current) drug therapy: Secondary | ICD-10-CM

## 2023-04-28 DIAGNOSIS — K219 Gastro-esophageal reflux disease without esophagitis: Secondary | ICD-10-CM | POA: Diagnosis present

## 2023-04-28 DIAGNOSIS — K529 Noninfective gastroenteritis and colitis, unspecified: Secondary | ICD-10-CM | POA: Diagnosis present

## 2023-04-28 DIAGNOSIS — Y92009 Unspecified place in unspecified non-institutional (private) residence as the place of occurrence of the external cause: Secondary | ICD-10-CM

## 2023-04-28 DIAGNOSIS — E039 Hypothyroidism, unspecified: Secondary | ICD-10-CM | POA: Diagnosis present

## 2023-04-28 DIAGNOSIS — J9691 Respiratory failure, unspecified with hypoxia: Secondary | ICD-10-CM

## 2023-04-28 DIAGNOSIS — S32030A Wedge compression fracture of third lumbar vertebra, initial encounter for closed fracture: Secondary | ICD-10-CM

## 2023-04-28 DIAGNOSIS — R42 Dizziness and giddiness: Secondary | ICD-10-CM

## 2023-04-28 DIAGNOSIS — Z90710 Acquired absence of both cervix and uterus: Secondary | ICD-10-CM

## 2023-04-28 DIAGNOSIS — W19XXXA Unspecified fall, initial encounter: Secondary | ICD-10-CM | POA: Diagnosis present

## 2023-04-28 DIAGNOSIS — M797 Fibromyalgia: Secondary | ICD-10-CM | POA: Diagnosis present

## 2023-04-28 DIAGNOSIS — S32030G Wedge compression fracture of third lumbar vertebra, subsequent encounter for fracture with delayed healing: Secondary | ICD-10-CM

## 2023-04-28 DIAGNOSIS — S32039A Unspecified fracture of third lumbar vertebra, initial encounter for closed fracture: Secondary | ICD-10-CM | POA: Diagnosis present

## 2023-04-28 DIAGNOSIS — Z7902 Long term (current) use of antithrombotics/antiplatelets: Secondary | ICD-10-CM

## 2023-04-28 LAB — COMPREHENSIVE METABOLIC PANEL
ALT: 24 U/L (ref 0–44)
AST: 31 U/L (ref 15–41)
Albumin: 3.5 g/dL (ref 3.5–5.0)
Alkaline Phosphatase: 65 U/L (ref 38–126)
Anion gap: 12 (ref 5–15)
BUN: 17 mg/dL (ref 8–23)
CO2: 23 mmol/L (ref 22–32)
Calcium: 9.2 mg/dL (ref 8.9–10.3)
Chloride: 104 mmol/L (ref 98–111)
Creatinine, Ser: 1.1 mg/dL — ABNORMAL HIGH (ref 0.44–1.00)
GFR, Estimated: 51 mL/min — ABNORMAL LOW (ref >60)
Glucose, Bld: 102 mg/dL — ABNORMAL HIGH (ref 70–99)
Potassium: 3.6 mmol/L (ref 3.5–5.1)
Sodium: 139 mmol/L (ref 135–145)
Total Bilirubin: 1 mg/dL (ref 0.0–1.2)
Total Protein: 6.1 g/dL — ABNORMAL LOW (ref 6.5–8.1)

## 2023-04-28 LAB — CBC
HCT: 42.5 % (ref 36.0–46.0)
Hemoglobin: 13.8 g/dL (ref 12.0–15.0)
MCH: 31.9 pg (ref 26.0–34.0)
MCHC: 32.5 g/dL (ref 30.0–36.0)
MCV: 98.4 fL (ref 80.0–100.0)
Platelets: 165 10*3/uL (ref 150–400)
RBC: 4.32 MIL/uL (ref 3.87–5.11)
RDW: 13 % (ref 11.5–15.5)
WBC: 8.8 10*3/uL (ref 4.0–10.5)
nRBC: 0 % (ref 0.0–0.2)

## 2023-04-28 LAB — I-STAT CHEM 8, ED
BUN: 19 mg/dL (ref 8–23)
Calcium, Ion: 1.21 mmol/L (ref 1.15–1.40)
Chloride: 105 mmol/L (ref 98–111)
Creatinine, Ser: 1.1 mg/dL — ABNORMAL HIGH (ref 0.44–1.00)
Glucose, Bld: 98 mg/dL (ref 70–99)
HCT: 44 % (ref 36.0–46.0)
Hemoglobin: 15 g/dL (ref 12.0–15.0)
Potassium: 3.6 mmol/L (ref 3.5–5.1)
Sodium: 142 mmol/L (ref 135–145)
TCO2: 24 mmol/L (ref 22–32)

## 2023-04-28 LAB — SAMPLE TO BLOOD BANK

## 2023-04-28 LAB — LIPASE, BLOOD: Lipase: 25 U/L (ref 11–51)

## 2023-04-28 LAB — TROPONIN I (HIGH SENSITIVITY)
Troponin I (High Sensitivity): 5 ng/L (ref <18)
Troponin I (High Sensitivity): 3 ng/L (ref <18)

## 2023-04-28 MED ORDER — RIVAROXABAN 10 MG PO TABS
10.0000 mg | ORAL_TABLET | Freq: Every day | ORAL | Status: DC
  Administered 2023-04-28 – 2023-05-03 (×6): 10 mg via ORAL
  Filled 2023-04-28 (×6): qty 1

## 2023-04-28 MED ORDER — GABAPENTIN 300 MG PO CAPS
900.0000 mg | ORAL_CAPSULE | Freq: Every day | ORAL | Status: DC
  Administered 2023-04-28 – 2023-05-02 (×5): 900 mg via ORAL
  Filled 2023-04-28 (×5): qty 3

## 2023-04-28 MED ORDER — POLYETHYLENE GLYCOL 3350 17 G PO PACK
17.0000 g | PACK | Freq: Every day | ORAL | Status: DC
  Administered 2023-04-30 – 2023-05-02 (×3): 17 g via ORAL
  Filled 2023-04-28 (×5): qty 1

## 2023-04-28 MED ORDER — FENTANYL CITRATE PF 50 MCG/ML IJ SOSY
50.0000 ug | PREFILLED_SYRINGE | Freq: Once | INTRAMUSCULAR | Status: AC
  Administered 2023-04-28: 50 ug via INTRAVENOUS
  Filled 2023-04-28: qty 1

## 2023-04-28 MED ORDER — LEVOTHYROXINE SODIUM 50 MCG PO TABS
50.0000 ug | ORAL_TABLET | Freq: Every day | ORAL | Status: DC
  Administered 2023-04-29: 50 ug via ORAL
  Filled 2023-04-28: qty 1

## 2023-04-28 MED ORDER — ROSUVASTATIN CALCIUM 20 MG PO TABS
20.0000 mg | ORAL_TABLET | Freq: Every day | ORAL | Status: DC
  Administered 2023-04-28 – 2023-05-02 (×5): 20 mg via ORAL
  Filled 2023-04-28 (×5): qty 1

## 2023-04-28 MED ORDER — OXYCODONE HCL 5 MG PO TABS
5.0000 mg | ORAL_TABLET | ORAL | Status: DC | PRN
  Administered 2023-04-28 – 2023-05-02 (×6): 5 mg via ORAL
  Filled 2023-04-28 (×6): qty 1

## 2023-04-28 MED ORDER — ACETAMINOPHEN 500 MG PO TABS
1000.0000 mg | ORAL_TABLET | Freq: Four times a day (QID) | ORAL | Status: DC
  Administered 2023-04-28 – 2023-05-03 (×16): 1000 mg via ORAL
  Filled 2023-04-28 (×16): qty 2

## 2023-04-28 MED ORDER — CLOPIDOGREL BISULFATE 75 MG PO TABS
75.0000 mg | ORAL_TABLET | Freq: Every day | ORAL | Status: DC
  Administered 2023-04-28 – 2023-05-03 (×6): 75 mg via ORAL
  Filled 2023-04-28 (×6): qty 1

## 2023-04-28 MED ORDER — ESCITALOPRAM OXALATE 10 MG PO TABS
10.0000 mg | ORAL_TABLET | Freq: Every day | ORAL | Status: DC
  Administered 2023-04-28 – 2023-05-03 (×6): 10 mg via ORAL
  Filled 2023-04-28 (×6): qty 1

## 2023-04-28 MED ORDER — TRAZODONE HCL 50 MG PO TABS
50.0000 mg | ORAL_TABLET | Freq: Every evening | ORAL | Status: DC | PRN
  Administered 2023-04-28 – 2023-05-02 (×5): 50 mg via ORAL
  Filled 2023-04-28 (×5): qty 1

## 2023-04-28 MED ORDER — PANTOPRAZOLE SODIUM 40 MG PO TBEC
40.0000 mg | DELAYED_RELEASE_TABLET | Freq: Every day | ORAL | Status: DC
  Administered 2023-04-28 – 2023-05-03 (×6): 40 mg via ORAL
  Filled 2023-04-28 (×6): qty 1

## 2023-04-28 MED ORDER — ONDANSETRON HCL 4 MG/2ML IJ SOLN
4.0000 mg | Freq: Once | INTRAMUSCULAR | Status: AC
  Administered 2023-04-28: 4 mg via INTRAVENOUS
  Filled 2023-04-28: qty 2

## 2023-04-28 MED ORDER — BUPROPION HCL ER (XL) 150 MG PO TB24
300.0000 mg | ORAL_TABLET | Freq: Every morning | ORAL | Status: DC
  Administered 2023-04-29 – 2023-05-03 (×5): 300 mg via ORAL
  Filled 2023-04-28 (×5): qty 2

## 2023-04-28 NOTE — ED Notes (Signed)
 X-ray at bedside

## 2023-04-28 NOTE — Progress Notes (Addendum)
 Orthopedic Tech Progress Note Patient Details:  [name] 01-07-1941 [id number]  Level II trauma. TLSO has been ordered. I will provide item once admit/discharge decision is made as that determines which TLSO is given. If admitted, I will bring this when she is in her room so it does not get lost in transit. [place], RN is in agreement with this plan.  Patient ID: Laura Fritz, female   DOB: Aug 29, 1940, 83 y.o.   MRN: [id number]  Tinnie Ronal Brasil 04/28/2023, 11:32 AM

## 2023-04-28 NOTE — ED Notes (Signed)
 IP providers at bedside.

## 2023-04-28 NOTE — ED Notes (Signed)
 The pt reports that her pain is better  she had been sleeping but a phone call woke her up

## 2023-04-28 NOTE — ED Provider Notes (Signed)
 [place] [facility] AT [facility] Provider Note   CSN: [id number] Arrival date & time: 04/28/23  9060     History  Chief Complaint  Patient presents with   Fall    Twice, On thinner    Laura Fritz is a 83 y.o. female.   Fall     Patient has a history of depression anxiety fibromyalgia acid reflux hypercholesterolemia chronic back pain, myocardial infarction, Parkinson's disease.  Patient presents ED for evaluation of syncopal episode and fall.  Patient states she got up to make reciting of a sandwich earlier this morning.  Patient suddenly became lightheaded as if she was going to pass out and then she fell.  Patient does not think she initially passed out until after she hit her head.  Patient was able to get herself up eventually with the use of a walker but it was very difficult.  Patient made it back to bed.  She got up later in the morning and then had another fall.  Patient did vomit again.  She has not had any abdominal pain.  No fevers or chills.  She is having significant pain in her back and also headache that is not as severe as her back pain since the fall.  Home Medications Prior to Admission medications   Medication Sig Start Date End Date Taking? Authorizing Provider  amphetamine -dextroamphetamine  (ADDERALL) 10 MG tablet Take 10 mg by mouth in the morning. 03/21/21   [provider]  buPROPion  (WELLBUTRIN  XL) 300 MG 24 hr tablet Take 300 mg by mouth every morning. 02/22/21   [provider]  cefUROXime  (CEFTIN ) 500 MG tablet Take 1 tablet (500 mg total) by mouth 2 (two) times daily with a meal. For bladder infection 04/01/21   Shepperson, Kirstin, PA-C  Cholecalciferol (VITAMIN D3) 1.25 MG (50000 UT) CAPS Take 1 capsule by mouth once a week. 01/19/22   [provider]  clopidogrel  (PLAVIX ) 75 MG tablet Take 75 mg by mouth daily.    [provider]  diphenhydrAMINE  (BENADRYL ) 25 MG tablet Take 25 mg by mouth  daily.    [provider]  escitalopram  (LEXAPRO ) 10 MG tablet Take 10 mg by mouth daily.    [provider]  gabapentin  (NEURONTIN ) 300 MG capsule Take 900 mg by mouth at bedtime.    [provider]  levothyroxine  (SYNTHROID ) 50 MCG tablet Take 50 mcg by mouth daily. 03/27/22   [provider]  levothyroxine  (SYNTHROID ) 75 MCG tablet Take 75 mcg by mouth in the morning.    [provider]  lidocaine  (LIDODERM ) 5 % Place 1 patch onto the skin daily. Remove & Discard patch within 12 hours or as directed by MD 04/10/22   Desiderio Chew, PA-C  loperamide  (IMODIUM ) 2 MG capsule Take 2 mg by mouth 4 (four) times daily as needed for diarrhea or loose stools. 03/21/21   [provider]  nitroGLYCERIN  (NITROSTAT ) 0.4 MG SL tablet Place 0.4 mg under the tongue every 5 (five) minutes as needed for chest pain.    [provider]  omeprazole (PRILOSEC) 20 MG capsule Take 20 mg by mouth in the morning.    [provider]  rosuvastatin  (CRESTOR ) 20 MG tablet Take 20 mg by mouth at bedtime. 01/12/22   [provider]  simvastatin  (ZOCOR ) 20 MG tablet Take 20 mg by mouth daily at 6 PM.     [provider]  traZODone  (DESYREL ) 50 MG tablet Take 50 mg by mouth  at bedtime as needed for sleep. 08/31/20   [provider]  Vitamin D , Ergocalciferol , (DRISDOL ) 1.25 MG (50000 UNIT) CAPS capsule Take 50,000 Units by mouth every 14 (fourteen) days.    [provider]      Allergies    Tramadol, Levofloxacin, Nsaids, and Sulfa antibiotics    Review of Systems   Review of Systems  Physical Exam Updated Vital Signs BP 134/70 (BP Location: Right Arm)   Pulse 80   Temp 97.7 F (36.5 C) (Axillary)   Resp 18   Ht 1.6 m (5' 3)   Wt 68 kg   SpO2 95%   BMI 26.56 kg/m  Physical Exam Vitals and nursing note reviewed.  Constitutional:      Appearance: She is well-developed. She is not diaphoretic.     Comments:  Elderly, frail  HENT:     Head: Normocephalic and atraumatic.     Right Ear: External ear normal.     Left Ear: External ear normal.  Eyes:     General: No scleral icterus.       Right eye: No discharge.        Left eye: No discharge.     Conjunctiva/sclera: Conjunctivae normal.  Neck:     Trachea: No tracheal deviation.  Cardiovascular:     Rate and Rhythm: Normal rate and regular rhythm.  Pulmonary:     Effort: Pulmonary effort is normal. No respiratory distress.     Breath sounds: Normal breath sounds. No stridor. No wheezing or rales.  Abdominal:     General: Bowel sounds are normal. There is no distension.     Palpations: Abdomen is soft.     Tenderness: There is no abdominal tenderness. There is no guarding or rebound.  Musculoskeletal:        General: No deformity.     Cervical back: Neck supple. No tenderness.     Thoracic back: Tenderness and bony tenderness present.     Lumbar back: Tenderness and bony tenderness present.     Comments: Ecchymoses noted lateral aspect of his lower back  Skin:    General: Skin is warm and dry.     Findings: No rash.  Neurological:     General: No focal deficit present.     Mental Status: She is alert.     Cranial Nerves: No cranial nerve deficit, dysarthria or facial asymmetry.     Sensory: No sensory deficit.     Motor: No abnormal muscle tone or seizure activity.     Coordination: Coordination normal.  Psychiatric:        Mood and Affect: Mood normal.     ED Results / Procedures / Treatments   Labs (all labs ordered are listed, but only abnormal results are displayed) Labs Reviewed  COMPREHENSIVE METABOLIC PANEL - Abnormal; Notable for the following components:      Result Value   Glucose, Bld 102 (*)    Creatinine, Ser 1.10 (*)    Total Protein 6.1 (*)    GFR, Estimated 51 (*)    All other components within normal limits  I-STAT CHEM 8, ED - Abnormal; Notable for the following components:   Creatinine, Ser 1.10 (*)     All other components within normal limits  CBC  LIPASE, BLOOD  URINALYSIS, ROUTINE W REFLEX MICROSCOPIC  SAMPLE TO BLOOD BANK  TROPONIN I (HIGH SENSITIVITY)  TROPONIN I (HIGH SENSITIVITY)    EKG None  Radiology CT Thoracic Spine Wo Contrast Result Date:  04/28/2023 CLINICAL DATA:  Fall with back pain. EXAM: CT THORACIC AND LUMBAR SPINE WITHOUT CONTRAST TECHNIQUE: Multidetector CT imaging of the thoracic and lumbar spine was performed without intravenous contrast. Multiplanar CT image reconstructions were also generated. RADIATION DOSE REDUCTION: This exam was performed according to the departmental dose-optimization program which includes automated exposure control, adjustment of the mA and/or kV according to patient size and/or use of iterative reconstruction technique. COMPARISON:  Thoracic spine CT dated 04/16/2021 and CT abdomen pelvis dated 04/01/2020. FINDINGS: CT THORACIC SPINE FINDINGS Alignment: Normal. Vertebrae: No acute fracture or focal pathologic process. Paraspinal and other soft tissues: Emphysematous changes are noted. There is mild bibasilar atelectasis. Vascular calcifications are seen in the thoracic aorta. Disc levels: Moderate multilevel degenerative disc and joint disease. CT LUMBAR SPINE FINDINGS Segmentation: 5 lumbar type vertebrae. Alignment: There is 3 mm retrolisthesis of L1 on L2 and 5 mm anterolisthesis of L5 on S1. Vertebrae: There is an acute compression fracture of L3 with approximately 25% height loss centrally. No significant retropulsion into the central canal. A chronic anterior wedge deformity of L1 with 25% height loss appears unchanged. Paraspinal and other soft tissues: No acute finding. Disc levels: Moderate multilevel degenerative disc and joint disease. IMPRESSION: 1. Acute compression fracture of L3 with approximately 25% height loss centrally. No retropulsion into the central canal. 2. No acute fracture or traumatic malalignment of the thoracic spine. Aortic  Atherosclerosis (ICD10-I70.0) and Emphysema (ICD10-J43.9). Electronically Signed   By: Norman Hopper M.D.   On: 04/28/2023 10:37   CT Lumbar Spine Wo Contrast Result Date: 04/28/2023 CLINICAL DATA:  Fall with back pain. EXAM: CT THORACIC AND LUMBAR SPINE WITHOUT CONTRAST TECHNIQUE: Multidetector CT imaging of the thoracic and lumbar spine was performed without intravenous contrast. Multiplanar CT image reconstructions were also generated. RADIATION DOSE REDUCTION: This exam was performed according to the departmental dose-optimization program which includes automated exposure control, adjustment of the mA and/or kV according to patient size and/or use of iterative reconstruction technique. COMPARISON:  Thoracic spine CT dated 04/16/2021 and CT abdomen pelvis dated 04/01/2020. FINDINGS: CT THORACIC SPINE FINDINGS Alignment: Normal. Vertebrae: No acute fracture or focal pathologic process. Paraspinal and other soft tissues: Emphysematous changes are noted. There is mild bibasilar atelectasis. Vascular calcifications are seen in the thoracic aorta. Disc levels: Moderate multilevel degenerative disc and joint disease. CT LUMBAR SPINE FINDINGS Segmentation: 5 lumbar type vertebrae. Alignment: There is 3 mm retrolisthesis of L1 on L2 and 5 mm anterolisthesis of L5 on S1. Vertebrae: There is an acute compression fracture of L3 with approximately 25% height loss centrally. No significant retropulsion into the central canal. A chronic anterior wedge deformity of L1 with 25% height loss appears unchanged. Paraspinal and other soft tissues: No acute finding. Disc levels: Moderate multilevel degenerative disc and joint disease. IMPRESSION: 1. Acute compression fracture of L3 with approximately 25% height loss centrally. No retropulsion into the central canal. 2. No acute fracture or traumatic malalignment of the thoracic spine. Aortic Atherosclerosis (ICD10-I70.0) and Emphysema (ICD10-J43.9). Electronically Signed   By: Norman Hopper M.D.   On: 04/28/2023 10:37   CT HEAD WO CONTRAST Result Date: 04/28/2023 CLINICAL DATA:  Blunt poly trauma.  Fall on back with bruising. EXAM: CT HEAD WITHOUT CONTRAST CT CERVICAL SPINE WITHOUT CONTRAST TECHNIQUE: Multidetector CT imaging of the head and cervical spine was performed following the standard protocol without intravenous contrast. Multiplanar CT image reconstructions of the cervical spine were also generated. RADIATION DOSE REDUCTION: This exam was performed according  to the departmental dose-optimization program which includes automated exposure control, adjustment of the mA and/or kV according to patient size and/or use of iterative reconstruction technique. COMPARISON:  04/16/2021 FINDINGS: CT HEAD FINDINGS Brain: No evidence of acute infarction, hemorrhage, hydrocephalus, extra-axial collection or mass lesion/mass effect. Low-density in the cerebral white matter attributed to chronic small vessel ischemia. Mild cerebral volume loss. Vascular: No hyperdense vessel or unexpected calcification. Skull: Normal. Negative for fracture or focal lesion. Sinuses/Orbits: Bilateral cataract resection. CT CERVICAL SPINE FINDINGS Alignment: No traumatic malalignment Skull base and vertebrae: No acute fracture. No primary bone lesion or focal pathologic process. Soft tissues and spinal canal: No prevertebral fluid or swelling. No visible canal hematoma. Disc levels: Degenerative disc space narrowing and ridging throughout the cervical spine. Upper chest: Clear apical lungs. IMPRESSION: No evidence of acute intracranial or cervical spine injury. Electronically Signed   By: Dorn Roulette M.D.   On: 04/28/2023 10:27   CT CERVICAL SPINE WO CONTRAST Result Date: 04/28/2023 CLINICAL DATA:  Blunt poly trauma.  Fall on back with bruising. EXAM: CT HEAD WITHOUT CONTRAST CT CERVICAL SPINE WITHOUT CONTRAST TECHNIQUE: Multidetector CT imaging of the head and cervical spine was performed following the standard  protocol without intravenous contrast. Multiplanar CT image reconstructions of the cervical spine were also generated. RADIATION DOSE REDUCTION: This exam was performed according to the departmental dose-optimization program which includes automated exposure control, adjustment of the mA and/or kV according to patient size and/or use of iterative reconstruction technique. COMPARISON:  04/16/2021 FINDINGS: CT HEAD FINDINGS Brain: No evidence of acute infarction, hemorrhage, hydrocephalus, extra-axial collection or mass lesion/mass effect. Low-density in the cerebral white matter attributed to chronic small vessel ischemia. Mild cerebral volume loss. Vascular: No hyperdense vessel or unexpected calcification. Skull: Normal. Negative for fracture or focal lesion. Sinuses/Orbits: Bilateral cataract resection. CT CERVICAL SPINE FINDINGS Alignment: No traumatic malalignment Skull base and vertebrae: No acute fracture. No primary bone lesion or focal pathologic process. Soft tissues and spinal canal: No prevertebral fluid or swelling. No visible canal hematoma. Disc levels: Degenerative disc space narrowing and ridging throughout the cervical spine. Upper chest: Clear apical lungs. IMPRESSION: No evidence of acute intracranial or cervical spine injury. Electronically Signed   By: Dorn Roulette M.D.   On: 04/28/2023 10:27   DG Chest Port 1 View Result Date: 04/28/2023 CLINICAL DATA:  Trauma.  Status post fall. EXAM: PORTABLE CHEST 1 VIEW COMPARISON:  04/10/2022 FINDINGS: Heart size is normal. No pleural scratch set aortic atherosclerotic calcifications. No pleural effusion, interstitial edema, airspace consolidation or pneumothorax. No acute osseous findings. IMPRESSION: 1. No acute cardiopulmonary disease. 2. Aortic Atherosclerosis (ICD10-I70.0). Electronically Signed   By: Waddell Calk M.D.   On: 04/28/2023 10:06   DG Pelvis Portable Result Date: 04/28/2023 CLINICAL DATA:  Trauma.  Status post fall. EXAM: PORTABLE  PELVIS 1-2 VIEWS COMPARISON:  CT 04/01/2020 FINDINGS: Both hips appear located without signs of fracture. Mild bilateral hip osteoarthritis. Degenerative disc disease noted within the lumbar spine. IMPRESSION: 1. No acute findings. 2. Mild bilateral hip osteoarthritis. Electronically Signed   By: Waddell Calk M.D.   On: 04/28/2023 10:04    Procedures Procedures    Medications Ordered in ED Medications  ondansetron  (ZOFRAN ) injection 4 mg (has no administration in time range)  fentaNYL  (SUBLIMAZE ) injection 50 mcg (has no administration in time range)    ED Course/ Medical Decision Making/ A&P Clinical Course as of 04/28/23 1131  Sun Apr 28, 2023  1058 X-rays show an acute  compression fracture of L3 with 25% loss of height.  No retropulsion noted in the canal [JK]  1059 Head and C-spine CT without acute findings [JK]  1059 Chest x-ray without acute findings.  Pelvis films without fracture [JK]  1129 Case discussed with IM service [JK]    Clinical Course User Index [JK] Randol Simmonds, MD                                 Medical Decision Making Problems Addressed: Closed compression fracture of L3 lumbar vertebra with delayed healing, subsequent encounter: acute illness or injury that poses a threat to life or bodily functions Fall, initial encounter: acute illness or injury Near syncope: acute illness or injury that poses a threat to life or bodily functions  Amount and/or Complexity of Data Reviewed Labs: ordered. Decision-making details documented in ED Course. Radiology: ordered and independent interpretation performed.  Risk Prescription drug management. Decision regarding hospitalization.   Presented to the ED for evaluation after a fall.  Symptoms were preceded by a near syncopal event of feeling lightheaded.  Patient is on anticoagulation and did hit her head.  Patient was activated as a level 2 trauma.  In the ED patient was alert and oriented.  Patient was not  hypotensive or tachycardic on arrival.  Laboratory test did not show any signs of severe anemia or dehydration.  No significant electrolyte abnormalities.  Patient's x-rays did not show any signs of severe head injury or C-spine injury.  Patient's CT scans of her spine however did show an L3 compression fracture that correlates with her area of pain and tenderness.  Patient was treated with IV pain medications.  I have ordered TLSO brace.  Will consult the medical service for admission and further treatment        Final Clinical Impression(s) / ED Diagnoses Final diagnoses:  Near syncope  Closed compression fracture of L3 lumbar vertebra with delayed healing, subsequent encounter  Fall, initial encounter    Rx / DC Orders ED Discharge Orders     None         Randol Simmonds, MD 04/28/23 1131

## 2023-04-28 NOTE — ED Notes (Signed)
 Daughter August Albino [phone]/ Son Henreitta Leber [phone] would like an update asap

## 2023-04-28 NOTE — Progress Notes (Signed)
   04/28/23 0945  Spiritual Encounters  Type of Visit Initial;Attempt (pt unavailable)  Care provided to: Patient;Pt not available  Conversation partners present during encounter Nurse  Referral source Trauma page  Reason for visit Trauma  OnCall Visit Yes   Chaplain responded to a trauma in the ED - level II, fall on blood thinners. Per EMS, family knew patient was here at the hospital. Chaplain was paged away to another call while the patient was still being assessed. Chaplain alerted nursing staff of availability. Spiritual care services available as needed.   Juliene CHRISTELLA Das, Chaplain 04/28/23

## 2023-04-28 NOTE — ED Notes (Signed)
 Pt arrived to room

## 2023-04-28 NOTE — Hospital Course (Addendum)
 Principal Problem:   Postural dizziness with near syncope Active Problems:   Closed compression fracture of L3 lumbar vertebra, initial encounter (HCC)   ASCVD (arteriosclerotic cardiovascular disease)   Sleep trouble   Hypothyroid   Parkinson disease (HCC)  Resolved Problems:   Fall   Hypoxic respiratory failure (HCC)   Syncope  Consults:***  Procedures:***  Follow-up items:***

## 2023-04-28 NOTE — ED Notes (Signed)
 Pt arrived to unit

## 2023-04-28 NOTE — ED Triage Notes (Signed)
 PT BIB EMS from home after two falls overnight, pt lives alone. Pt reports falling at 0400 in the kitchen- she hit front of head on kitchen counter then fell back and hit back of head on floor. Pt doesn't remember second fall. She woke up on floor, crawled to bed then called daughter, who then called 911. EMS placed 20 L ac. Pt is A&Ox4.

## 2023-04-28 NOTE — ED Notes (Signed)
Patient transported to CT with trauma RN °

## 2023-04-28 NOTE — H&P (Signed)
 Date: 04/28/2023               Patient Name:  Laura Fritz MRN: [id number]  DOB: [birth year] Age / Sex: 83 y.o., female   PCP: Baird Comer [name], NP         Medical Service: Internal Medicine Teaching Service         Attending Physician: Dr. Rosan Dayton BROCKS, DO      First Contact: Dr. Ozell Riff, MD Pager [phone]    Second Contact: Dr. Ozell Kung, MD Pager [phone]         After Hours (After 5p/  First Contact Pager: [phone]  weekends / holidays): Second Contact Pager: [phone]   SUBJECTIVE   Chief Complaint: fall at home x 2  History of Present Illness:  Laura Fritz is an 83 year old female with a past medical history of parkinson's disease, depression, fibromyalgia, and anxiety who presents after two falls at home. She states that she woke up around 0400 this morning to feed her dog and get a midnight snack, which is not unusual for her. While she was making a sandwich, she felt dizzy, began to breathe heavily, then fell backwards. She hit her head, but did not lose consciousness. She was able to get up on her own and got back to bed with help from her walker. Then, on her way to the bathroom a few hours later, she fell again. She denies any prodromal symptoms other than dizziness preceding the first fall. She states she has been feeling somewhat weak and low energy the last 4 days, so much so that she started using her walker again. She says she feels like she doesn't drink enough water . She also reports a history of chronic diarrhea for which she takes medicine as needed, but says this has been well-controlled recently. She feels like her falls are related to progression of her parkinson's.  ED Course: Hemodynamically stable. Electrolytes, CBC normal. CT head, c-spine, thoracic spine unremarkable. CT lumbar spine showed acute compression fracture of L3. She was treated with IV pain medications.   Meds:  Current Meds  Medication Sig   buPROPion  (WELLBUTRIN  XL) 300 MG 24  hr tablet Take 300 mg by mouth every morning.   clopidogrel  (PLAVIX ) 75 MG tablet Take 75 mg by mouth daily.   escitalopram  (LEXAPRO ) 10 MG tablet Take 10 mg by mouth daily.   gabapentin  (NEURONTIN ) 300 MG capsule Take 900 mg by mouth at bedtime.   levothyroxine  (SYNTHROID ) 50 MCG tablet Take 50 mcg by mouth daily.   loperamide  (IMODIUM ) 2 MG capsule Take 2 mg by mouth 4 (four) times daily as needed for diarrhea or loose stools.   nitroGLYCERIN  (NITROSTAT ) 0.4 MG SL tablet Place 0.4 mg under the tongue every 5 (five) minutes as needed for chest pain.   omeprazole (PRILOSEC) 20 MG capsule Take 20 mg by mouth in the morning.   rosuvastatin  (CRESTOR ) 20 MG tablet Take 20 mg by mouth at bedtime.   traZODone  (DESYREL ) 50 MG tablet Take 50 mg by mouth at bedtime as needed for sleep.   Vitamin D , Ergocalciferol , (DRISDOL ) 1.25 MG (50000 UNIT) CAPS capsule Take 50,000 Units by mouth every 14 (fourteen) days.   vitamin E 1000 UNIT capsule Take 1,000 Units by mouth daily.   [DISCONTINUED] diphenhydrAMINE  (BENADRYL ) 25 MG tablet Take 25 mg by mouth daily.   Past Medical History: Past Medical History:  Diagnosis Date   Anxiety    Back pain    Coronary  artery disease    Depression    Fibromyalgia    GERD (gastroesophageal reflux disease)    Graves disease    Heart murmur    Hepatitis    Hypercholesteremia    Hypothyroidism    Myocardial infarct (HCC)    Parkinson disease    Past Surgical History: Past Surgical History:  Procedure Laterality Date   ABDOMINAL HYSTERECTOMY     ANTERIOR (CYSTOCELE) AND POSTERIOR REPAIR (RECTOCELE) WITH XENFORM GRAFT AND SACROSPINOUS FIXATION     REVERSE SHOULDER ARTHROPLASTY Right 03/31/2021   Procedure: REVERSE SHOULDER ARTHROPLASTY;  Surgeon: Cristy Bonner DASEN, MD;  Location: [facility];  Service: Orthopedics;  Laterality: Right;   TUBAL LIGATION     Social:  Lives With: alone, with her dog Occupation: none Support: help from daughter Level of Function:  independent in ADLs with assistance from daughter PCP: Hemberg, Comer [name], NP Substances: None  Family History: Family History  Problem Relation Age of Onset   Lung cancer Mother    Esophageal cancer Maternal Grandfather    Esophageal cancer Cousin    Colon cancer Neg Hx    Pancreatic cancer Neg Hx    Stomach cancer Neg Hx    Liver disease Neg Hx    Allergies: Allergies as of 04/28/2023 - Review Complete 04/28/2023  Allergen Reaction Noted   Tramadol Anaphylaxis 08/30/2015   Budesonide  Other (See Comments) 05/14/2022   Levofloxacin Other (See Comments) 08/30/2015   Mirabegron Other (See Comments) 05/14/2022   Mirabegron er Other (See Comments) 05/14/2022   Nsaids Rash 08/30/2015   Sulfa antibiotics Rash 08/30/2015   Amoxicillin Other (See Comments) and Nausea Only 04/27/2022   Nystatin Other (See Comments) 05/14/2022   Review of Systems: A complete ROS was negative except as per HPI.   OBJECTIVE:   Physical Exam: Blood pressure 116/68, pulse 83, temperature 98.6 F (37 C), temperature source Oral, resp. rate 15, height 5' 3 (1.6 m), weight 68 kg, SpO2 94%.  Constitutional: well-appearing, laying in bed in no distress, on 3L [place] HENT: mucous membranes dry Cardiovascular: regular rate and rhythm, no m/r/g Pulmonary/Chest: normal work of breathing on room air, lungs clear to auscultation bilaterally Abdominal: soft, non-tender, non-distended MSK: normal bulk and tone; tenderness over L spine Neurological: alert & oriented x 3, moving all extremities; pill-rolling tremor of left hand, at rest and with intentional movements Skin: warm and dry  Labs: CBC    Component Value Date/Time   WBC 8.8 04/28/2023 0951   RBC 4.32 04/28/2023 0951   HGB 15.0 04/28/2023 0959   HCT 44.0 04/28/2023 0959   PLT 165 04/28/2023 0951   MCV 98.4 04/28/2023 0951   MCH 31.9 04/28/2023 0951   MCHC 32.5 04/28/2023 0951   RDW 13.0 04/28/2023 0951   LYMPHSABS 2.7 04/16/2021 1631   MONOABS  0.5 04/16/2021 1631   EOSABS 0.1 04/16/2021 1631   BASOSABS 0.1 04/16/2021 1631    CMP     Component Value Date/Time   NA 142 04/28/2023 0959   K 3.6 04/28/2023 0959   CL 105 04/28/2023 0959   CO2 23 04/28/2023 0951   GLUCOSE 98 04/28/2023 0959   BUN 19 04/28/2023 0959   CREATININE 1.10 (H) 04/28/2023 0959   CALCIUM  9.2 04/28/2023 0951   PROT 6.1 (L) 04/28/2023 0951   ALBUMIN 3.5 04/28/2023 0951   AST 31 04/28/2023 0951   ALT 24 04/28/2023 0951   ALKPHOS 65 04/28/2023 0951   BILITOT 1.0 04/28/2023 0951   GFRNONAA 51 (L) 04/28/2023  9048   GFRAA >60 05/13/2016 1805   Imaging: CT Thoracic Spine Wo Contrast CLINICAL DATA:  Fall with back pain.  EXAM: CT THORACIC AND LUMBAR SPINE WITHOUT CONTRAST  TECHNIQUE: Multidetector CT imaging of the thoracic and lumbar spine was performed without intravenous contrast. Multiplanar CT image reconstructions were also generated.  RADIATION DOSE REDUCTION: This exam was performed according to the departmental dose-optimization program which includes automated exposure control, adjustment of the mA and/or kV according to patient size and/or use of iterative reconstruction technique.  COMPARISON:  Thoracic spine CT dated 04/16/2021 and CT abdomen pelvis dated 04/01/2020.  FINDINGS: CT THORACIC SPINE FINDINGS  Alignment: Normal.  Vertebrae: No acute fracture or focal pathologic process.  Paraspinal and other soft tissues: Emphysematous changes are noted. There is mild bibasilar atelectasis. Vascular calcifications are seen in the thoracic aorta.  Disc levels: Moderate multilevel degenerative disc and joint disease.  CT LUMBAR SPINE FINDINGS  Segmentation: 5 lumbar type vertebrae.  Alignment: There is 3 mm retrolisthesis of L1 on L2 and 5 mm anterolisthesis of L5 on S1.  Vertebrae: There is an acute compression fracture of L3 with approximately 25% height loss centrally. No significant retropulsion into the central canal. A  chronic anterior wedge deformity of L1 with 25% height loss appears unchanged.  Paraspinal and other soft tissues: No acute finding.  Disc levels: Moderate multilevel degenerative disc and joint disease.  IMPRESSION: 1. Acute compression fracture of L3 with approximately 25% height loss centrally. No retropulsion into the central canal. 2. No acute fracture or traumatic malalignment of the thoracic spine.  Aortic Atherosclerosis (ICD10-I70.0) and Emphysema (ICD10-J43.9).  Electronically Signed   By: Norman Hopper M.D.   On: 04/28/2023 10:37 CT Lumbar Spine Wo Contrast CLINICAL DATA:  Fall with back pain.  EXAM: CT THORACIC AND LUMBAR SPINE WITHOUT CONTRAST  TECHNIQUE: Multidetector CT imaging of the thoracic and lumbar spine was performed without intravenous contrast. Multiplanar CT image reconstructions were also generated.  RADIATION DOSE REDUCTION: This exam was performed according to the departmental dose-optimization program which includes automated exposure control, adjustment of the mA and/or kV according to patient size and/or use of iterative reconstruction technique.  COMPARISON:  Thoracic spine CT dated 04/16/2021 and CT abdomen pelvis dated 04/01/2020.  FINDINGS: CT THORACIC SPINE FINDINGS  Alignment: Normal.  Vertebrae: No acute fracture or focal pathologic process.  Paraspinal and other soft tissues: Emphysematous changes are noted. There is mild bibasilar atelectasis. Vascular calcifications are seen in the thoracic aorta.  Disc levels: Moderate multilevel degenerative disc and joint disease.  CT LUMBAR SPINE FINDINGS  Segmentation: 5 lumbar type vertebrae.  Alignment: There is 3 mm retrolisthesis of L1 on L2 and 5 mm anterolisthesis of L5 on S1.  Vertebrae: There is an acute compression fracture of L3 with approximately 25% height loss centrally. No significant retropulsion into the central canal. A chronic anterior wedge deformity of  L1 with 25% height loss appears unchanged.  Paraspinal and other soft tissues: No acute finding.  Disc levels: Moderate multilevel degenerative disc and joint disease.  IMPRESSION: 1. Acute compression fracture of L3 with approximately 25% height loss centrally. No retropulsion into the central canal. 2. No acute fracture or traumatic malalignment of the thoracic spine.  Aortic Atherosclerosis (ICD10-I70.0) and Emphysema (ICD10-J43.9).  Electronically Signed   By: Norman Hopper M.D.   On: 04/28/2023 10:37 CT CERVICAL SPINE WO CONTRAST CLINICAL DATA:  Blunt poly trauma.  Fall on back with bruising.  EXAM: CT HEAD WITHOUT CONTRAST  CT CERVICAL SPINE WITHOUT CONTRAST  TECHNIQUE: Multidetector CT imaging of the head and cervical spine was performed following the standard protocol without intravenous contrast. Multiplanar CT image reconstructions of the cervical spine were also generated.  RADIATION DOSE REDUCTION: This exam was performed according to the departmental dose-optimization program which includes automated exposure control, adjustment of the mA and/or kV according to patient size and/or use of iterative reconstruction technique.  COMPARISON:  04/16/2021  FINDINGS: CT HEAD FINDINGS  Brain: No evidence of acute infarction, hemorrhage, hydrocephalus, extra-axial collection or mass lesion/mass effect. Low-density in the cerebral white matter attributed to chronic small vessel ischemia. Mild cerebral volume loss.  Vascular: No hyperdense vessel or unexpected calcification.  Skull: Normal. Negative for fracture or focal lesion.  Sinuses/Orbits: Bilateral cataract resection.  CT CERVICAL SPINE FINDINGS  Alignment: No traumatic malalignment  Skull base and vertebrae: No acute fracture. No primary bone lesion or focal pathologic process.  Soft tissues and spinal canal: No prevertebral fluid or swelling. No visible canal hematoma.  Disc levels:  Degenerative disc space narrowing and ridging throughout the cervical spine.  Upper chest: Clear apical lungs.  IMPRESSION: No evidence of acute intracranial or cervical spine injury.  Electronically Signed   By: Dorn Roulette M.D.   On: 04/28/2023 10:27 CT HEAD WO CONTRAST CLINICAL DATA:  Blunt poly trauma.  Fall on back with bruising.  EXAM: CT HEAD WITHOUT CONTRAST  CT CERVICAL SPINE WITHOUT CONTRAST  TECHNIQUE: Multidetector CT imaging of the head and cervical spine was performed following the standard protocol without intravenous contrast. Multiplanar CT image reconstructions of the cervical spine were also generated.  RADIATION DOSE REDUCTION: This exam was performed according to the departmental dose-optimization program which includes automated exposure control, adjustment of the mA and/or kV according to patient size and/or use of iterative reconstruction technique.  COMPARISON:  04/16/2021  FINDINGS: CT HEAD FINDINGS  Brain: No evidence of acute infarction, hemorrhage, hydrocephalus, extra-axial collection or mass lesion/mass effect. Low-density in the cerebral white matter attributed to chronic small vessel ischemia. Mild cerebral volume loss.  Vascular: No hyperdense vessel or unexpected calcification.  Skull: Normal. Negative for fracture or focal lesion.  Sinuses/Orbits: Bilateral cataract resection.  CT CERVICAL SPINE FINDINGS  Alignment: No traumatic malalignment  Skull base and vertebrae: No acute fracture. No primary bone lesion or focal pathologic process.  Soft tissues and spinal canal: No prevertebral fluid or swelling. No visible canal hematoma.  Disc levels: Degenerative disc space narrowing and ridging throughout the cervical spine.  Upper chest: Clear apical lungs.  IMPRESSION: No evidence of acute intracranial or cervical spine injury.  Electronically Signed   By: Dorn Roulette M.D.   On: 04/28/2023 10:27 DG Chest Port 1  View CLINICAL DATA:  Trauma.  Status post fall.  EXAM: PORTABLE CHEST 1 VIEW  COMPARISON:  04/10/2022  FINDINGS: Heart size is normal. No pleural scratch set aortic atherosclerotic calcifications. No pleural effusion, interstitial edema, airspace consolidation or pneumothorax. No acute osseous findings.  IMPRESSION: 1. No acute cardiopulmonary disease. 2. Aortic Atherosclerosis (ICD10-I70.0).  Electronically Signed   By: Waddell Calk M.D.   On: 04/28/2023 10:06 DG Pelvis Portable CLINICAL DATA:  Trauma.  Status post fall.  EXAM: PORTABLE PELVIS 1-2 VIEWS  COMPARISON:  CT 04/01/2020  FINDINGS: Both hips appear located without signs of fracture. Mild bilateral hip osteoarthritis. Degenerative disc disease noted within the lumbar spine.  IMPRESSION: 1. No acute findings. 2. Mild bilateral hip osteoarthritis.  Electronically Signed   By: Waddell  Joan M.D.   On: 04/28/2023 10:04  EKG: personally reviewed my interpretation is sinus rhythm, no ischemic changes  ASSESSMENT & PLAN:  Assessment & Plan by Problem: Principal Problem:   Postural dizziness with near syncope Active Problems:   Fall   Closed compression fracture of L3 lumbar vertebra, initial encounter (HCC)   ASCVD (arteriosclerotic cardiovascular disease)   Sleep trouble   Hypothyroid   Hypoxic respiratory failure (HCC)   Parkinson disease (HCC)  Laura Fritz is a 83 y.o. person living with a history of parkinson's disease, depression, fibromyalgia, and anxiety who presented after two falls on plavix  and is admitted for syncope workup on hospital day 0  Postural dizziness with near syncope Parkinson's disease Patient has had repeated falls, including two within the past 24 hours. She had some dizziness but no other prodrome symptoms. I suspect this is orthostasis, perhaps in the setting of poor oral intake, but could also be related to progression of her parkinson's disease. We will check orthostatic  vital signs. Low suspicion for cardiac causes of syncope given the history, but she has known ASCVD, so we will get carotid ultrasounds. She is on some centrally acting medications like trazodone  and benadryl , which may also be contributing. We will hold the benadryl . She is not currently on any medications for parkinson's disease.   Recent falls Acute compression fracture of L3 New traumatic compression fracture from one of her recent falls without retropulsion in the spinal canal. She is on plavix  for coronary artery disease. No intracranial, c-spine, or t-spine injuries or bleeding on imaging. She reports lower back pain, which is well-controlled currently with fentanyl . We will add scheduled tylenol  and oxycodone  5 mg q4h prn. We will have PT/OT evaluate her, as I suspect she may need some short term rehab before returning home. Will also check a vitamin D  level.   Hypothyroidism On levothyroxine  50 mcg daily, continued. Has not has had a recent TSH, so we will check one.   Chronic diarrhea Well-controlled on loperamide , which we will continue.   Depression On lexapro  10 mg daily and bupropion  300 mg daily, continued.   Coronary artery disease On rosuvastatin  20 mg daily, plavix  75 mg daily, continued.  GERD On omeprazole 20 mg daily, continued.    Diet: Normal VTE: DOAC IVF: None Code: DNR  Prior to Admission Living Arrangement: Home, living alone Anticipated Discharge Location: Pending PT/OT eval Barriers to Discharge: syncope workup  Dispo: Admit patient to Observation with expected length of stay less than 2 midnights.  Signed: Ozell Riff, MD Internal Medicine Resident, PGY-1 [facility]  Pager: [phone]  04/28/2023, 4:02 PM

## 2023-04-29 ENCOUNTER — Encounter (HOSPITAL_COMMUNITY): Payer: Self-pay | Admitting: Internal Medicine

## 2023-04-29 ENCOUNTER — Observation Stay (HOSPITAL_COMMUNITY): Payer: Medicare Other

## 2023-04-29 ENCOUNTER — Other Ambulatory Visit: Payer: Self-pay

## 2023-04-29 DIAGNOSIS — J9601 Acute respiratory failure with hypoxia: Secondary | ICD-10-CM | POA: Diagnosis present

## 2023-04-29 DIAGNOSIS — G8929 Other chronic pain: Secondary | ICD-10-CM | POA: Diagnosis present

## 2023-04-29 DIAGNOSIS — K219 Gastro-esophageal reflux disease without esophagitis: Secondary | ICD-10-CM | POA: Diagnosis present

## 2023-04-29 DIAGNOSIS — I2510 Atherosclerotic heart disease of native coronary artery without angina pectoris: Secondary | ICD-10-CM

## 2023-04-29 DIAGNOSIS — Z885 Allergy status to narcotic agent status: Secondary | ICD-10-CM | POA: Diagnosis not present

## 2023-04-29 DIAGNOSIS — G20A1 Parkinson's disease without dyskinesia, without mention of fluctuations: Secondary | ICD-10-CM | POA: Diagnosis not present

## 2023-04-29 DIAGNOSIS — I252 Old myocardial infarction: Secondary | ICD-10-CM | POA: Diagnosis not present

## 2023-04-29 DIAGNOSIS — F32A Depression, unspecified: Secondary | ICD-10-CM | POA: Diagnosis present

## 2023-04-29 DIAGNOSIS — Z79890 Hormone replacement therapy (postmenopausal): Secondary | ICD-10-CM | POA: Diagnosis not present

## 2023-04-29 DIAGNOSIS — S32030G Wedge compression fracture of third lumbar vertebra, subsequent encounter for fracture with delayed healing: Secondary | ICD-10-CM | POA: Diagnosis not present

## 2023-04-29 DIAGNOSIS — E7800 Pure hypercholesterolemia, unspecified: Secondary | ICD-10-CM | POA: Diagnosis present

## 2023-04-29 DIAGNOSIS — Z66 Do not resuscitate: Secondary | ICD-10-CM | POA: Diagnosis present

## 2023-04-29 DIAGNOSIS — Z7902 Long term (current) use of antithrombotics/antiplatelets: Secondary | ICD-10-CM | POA: Diagnosis not present

## 2023-04-29 DIAGNOSIS — R55 Syncope and collapse: Secondary | ICD-10-CM | POA: Diagnosis present

## 2023-04-29 DIAGNOSIS — Z90710 Acquired absence of both cervix and uterus: Secondary | ICD-10-CM | POA: Diagnosis not present

## 2023-04-29 DIAGNOSIS — S32039A Unspecified fracture of third lumbar vertebra, initial encounter for closed fracture: Secondary | ICD-10-CM | POA: Diagnosis present

## 2023-04-29 DIAGNOSIS — K529 Noninfective gastroenteritis and colitis, unspecified: Secondary | ICD-10-CM | POA: Diagnosis present

## 2023-04-29 DIAGNOSIS — W19XXXA Unspecified fall, initial encounter: Secondary | ICD-10-CM | POA: Diagnosis present

## 2023-04-29 DIAGNOSIS — G903 Multi-system degeneration of the autonomic nervous system: Secondary | ICD-10-CM | POA: Diagnosis present

## 2023-04-29 DIAGNOSIS — R296 Repeated falls: Secondary | ICD-10-CM | POA: Diagnosis present

## 2023-04-29 DIAGNOSIS — E860 Dehydration: Secondary | ICD-10-CM | POA: Diagnosis present

## 2023-04-29 DIAGNOSIS — Y92009 Unspecified place in unspecified non-institutional (private) residence as the place of occurrence of the external cause: Secondary | ICD-10-CM | POA: Diagnosis not present

## 2023-04-29 DIAGNOSIS — R42 Dizziness and giddiness: Secondary | ICD-10-CM

## 2023-04-29 DIAGNOSIS — M797 Fibromyalgia: Secondary | ICD-10-CM | POA: Diagnosis present

## 2023-04-29 DIAGNOSIS — G252 Other specified forms of tremor: Secondary | ICD-10-CM | POA: Diagnosis present

## 2023-04-29 DIAGNOSIS — E039 Hypothyroidism, unspecified: Secondary | ICD-10-CM | POA: Diagnosis present

## 2023-04-29 DIAGNOSIS — F419 Anxiety disorder, unspecified: Secondary | ICD-10-CM | POA: Diagnosis present

## 2023-04-29 DIAGNOSIS — Z881 Allergy status to other antibiotic agents status: Secondary | ICD-10-CM | POA: Diagnosis not present

## 2023-04-29 DIAGNOSIS — Z96611 Presence of right artificial shoulder joint: Secondary | ICD-10-CM | POA: Diagnosis present

## 2023-04-29 DIAGNOSIS — Z79899 Other long term (current) drug therapy: Secondary | ICD-10-CM | POA: Diagnosis not present

## 2023-04-29 LAB — BASIC METABOLIC PANEL
Anion gap: 9 (ref 5–15)
BUN: 16 mg/dL (ref 8–23)
CO2: 24 mmol/L (ref 22–32)
Calcium: 8.9 mg/dL (ref 8.9–10.3)
Chloride: 107 mmol/L (ref 98–111)
Creatinine, Ser: 0.99 mg/dL (ref 0.44–1.00)
GFR, Estimated: 58 mL/min — ABNORMAL LOW (ref >60)
Glucose, Bld: 81 mg/dL (ref 70–99)
Potassium: 4.2 mmol/L (ref 3.5–5.1)
Sodium: 140 mmol/L (ref 135–145)

## 2023-04-29 LAB — URINALYSIS, ROUTINE W REFLEX MICROSCOPIC
Bilirubin Urine: NEGATIVE
Glucose, UA: NEGATIVE mg/dL
Hgb urine dipstick: NEGATIVE
Ketones, ur: NEGATIVE mg/dL
Nitrite: NEGATIVE
Protein, ur: NEGATIVE mg/dL
Specific Gravity, Urine: 1.015 (ref 1.005–1.030)
pH: 6 (ref 5.0–8.0)

## 2023-04-29 LAB — CBC
HCT: 40.9 % (ref 36.0–46.0)
Hemoglobin: 13.3 g/dL (ref 12.0–15.0)
MCH: 32.2 pg (ref 26.0–34.0)
MCHC: 32.5 g/dL (ref 30.0–36.0)
MCV: 99 fL (ref 80.0–100.0)
Platelets: 149 10*3/uL — ABNORMAL LOW (ref 150–400)
RBC: 4.13 MIL/uL (ref 3.87–5.11)
RDW: 13.3 % (ref 11.5–15.5)
WBC: 7.2 10*3/uL (ref 4.0–10.5)
nRBC: 0 % (ref 0.0–0.2)

## 2023-04-29 LAB — TSH: TSH: 0.112 u[IU]/mL — ABNORMAL LOW (ref 0.350–4.500)

## 2023-04-29 LAB — VITAMIN D 25 HYDROXY (VIT D DEFICIENCY, FRACTURES): Vit D, 25-Hydroxy: 57.73 ng/mL (ref 30–100)

## 2023-04-29 MED ORDER — LOPERAMIDE HCL 2 MG PO CAPS
2.0000 mg | ORAL_CAPSULE | Freq: Three times a day (TID) | ORAL | Status: DC | PRN
  Administered 2023-04-29: 2 mg via ORAL
  Filled 2023-04-29: qty 1

## 2023-04-29 MED ORDER — LACTATED RINGERS IV SOLN: INTRAVENOUS | Status: AC

## 2023-04-29 MED ORDER — LEVOTHYROXINE SODIUM 25 MCG PO TABS
25.0000 ug | ORAL_TABLET | Freq: Every day | ORAL | Status: DC
Start: 2023-04-30 — End: 2023-05-03
  Administered 2023-04-30 – 2023-05-03 (×4): 25 ug via ORAL
  Filled 2023-04-29 (×4): qty 1

## 2023-04-29 MED ORDER — LORATADINE 10 MG PO TABS: 10.0000 mg | ORAL_TABLET | Freq: Every day | ORAL | Status: DC | PRN

## 2023-04-29 MED ORDER — LIDOCAINE 5 % EX PTCH
1.0000 | MEDICATED_PATCH | CUTANEOUS | Status: DC
  Administered 2023-04-29 – 2023-05-03 (×5): 1 via TRANSDERMAL
  Filled 2023-04-29 (×5): qty 1

## 2023-04-29 NOTE — Progress Notes (Signed)
 VASCULAR LAB    Carotid duplex has been performed.  See CV proc for preliminary results.   Larena Ohnemus, RVT 04/29/2023, 2:32 PM

## 2023-04-29 NOTE — Progress Notes (Signed)
 Transition of Care [facility]) - CAGE-AID Screening   Patient Details  Name: Laura Fritz MRN: [id number] Date of Birth: 14-May-1940  [facility] CM/SW Contact:    Bernardino Mayotte, RN Phone Number: 04/29/2023, 11:57 PM   Clinical Narrative:  Patient denies the use of alcohol and illicit substances. Resources not given at this time.   CAGE-AID Screening:    Have You Ever Felt You Ought to Cut Down on Your Drinking or Drug Use?: No Have People Annoyed You By Critizing Your Drinking Or Drug Use?: No Have You Felt Bad Or Guilty About Your Drinking Or Drug Use?: No Have You Ever Had a Drink or Used Drugs First Thing In The Morning to Steady Your Nerves or to Get Rid of a Hangover?: No CAGE-AID Score: 0  Substance Abuse Education Offered: No

## 2023-04-29 NOTE — Progress Notes (Signed)
 Orthopedic Tech Progress Note Patient Details:  Laura Fritz [birth year] [id number]  Ortho Devices Type of Ortho Device: Thoracolumbar corset (TLSO) Ortho Device/Splint Location: BACK Ortho Device/Splint Interventions: Ordered, Adjustment, Application, Removal   Post Interventions Patient Tolerated: Well, Fair Instructions Provided: Care of device  [name] Pac 04/29/2023, 9:14 AM

## 2023-04-29 NOTE — Progress Notes (Addendum)
 PT Cancellation Note  Patient Details Name: Laura Fritz MRN: [id number] DOB: Jan 16, 1941   Cancelled Treatment:    Reason Eval/Treat Not Completed: Patient at procedure or test/unavailable (Pt off unit. Will follow up at later date/time as schedule allows and pt able.)  Re-attempted at 16:32 and pt eating dinner, assisted with repositioning for meal. Followed up again at 17:07  and pt ~50% through dinner, reporting she is a slow eater. Will follow up with pt at later date/time.  Vernell DONEEN KLEIN, DPT Acute Rehabilitation Services Office [phone]  04/29/23 2:29 PM

## 2023-04-29 NOTE — ED Notes (Signed)
 Nasal 02 increased to 5 liters due to  02 sats  89 due to medication given for pain

## 2023-04-29 NOTE — Progress Notes (Addendum)
 HD#0 SUBJECTIVE:  Patient Summary: Laura Fritz is a 83 y.o. female with a pertinent PMH of parkinson's disease, depression, fibromyalgia, and anxiety who presented after two falls on plavix  and is admitted for syncope workup on hospital day 1.   Overnight Events: None  Interim History: Patient evaluated at bedside. Wearing a back brace. Had a large bowel movement. Denies any pain or new concerns this morning. On 2L [place]. Denies coughing, shortness of breath.   OBJECTIVE:  Vital Signs: Vitals:   04/29/23 0215 04/29/23 0248 04/29/23 0600 04/29/23 0724  BP: (!) 106/49 (!) 102/59 (!) 108/55 102/62  Pulse: 61 65 66 65  Resp: 12 11 14 18   Temp:  98 F (36.7 C) 97.9 F (36.6 C) 97.9 F (36.6 C)  TempSrc:  Oral  Oral  SpO2: 96% 96% 95% 92%  Weight:      Height:       Supplemental O2: Nasal Cannula SpO2: 92 % O2 Flow Rate (L/min): 2 L/min FiO2 (%): 100 %  Filed Weights   04/28/23 0955  Weight: 68 kg   Physical Exam: General: well-appearing, in no acute distress, sitting in chair with back brace on Cardiac: regular rate and rhythm, no murmurs, euvolemic Pulmonary: CTA bilaterally, normal effort and rate Abdomen: soft, non-tender Neuro: No focal deficits or radicular symptoms on exam. Alert and oriented.  Psych: Normal mood and affect  Patient Lines/Drains/Airways Status     Active Line/Drains/Airways     Name Placement date Placement time Site Days   Peripheral IV 04/16/21 20 G Left Antecubital 04/16/21  1631  Antecubital  743   Peripheral IV 04/28/23 20 G Anterior;Left;Proximal Forearm 04/28/23  0930  Forearm  1   Incision (Closed) 03/31/21 Shoulder Right 03/31/21  1813  -- 759            Pertinent Labs:    Latest Ref Rng & Units 04/29/2023    6:14 AM 04/28/2023    9:59 AM 04/28/2023    9:51 AM  CBC  WBC 4.0 - 10.5 K/uL 7.2   8.8   Hemoglobin 12.0 - 15.0 g/dL 86.6  84.9  86.1   Hematocrit 36.0 - 46.0 % 40.9  44.0  42.5   Platelets 150 - 400 K/uL 149   165         Latest Ref Rng & Units 04/29/2023    6:14 AM 04/28/2023    9:59 AM 04/28/2023    9:51 AM  CMP  Glucose 70 - 99 mg/dL 81  98  897   BUN 8 - 23 mg/dL 16  19  17    Creatinine 0.44 - 1.00 mg/dL 9.00  8.89  8.89   Sodium 135 - 145 mmol/L 140  142  139   Potassium 3.5 - 5.1 mmol/L 4.2  3.6  3.6   Chloride 98 - 111 mmol/L 107  105  104   CO2 22 - 32 mmol/L 24   23   Calcium  8.9 - 10.3 mg/dL 8.9   9.2   Total Protein 6.5 - 8.1 g/dL   6.1   Total Bilirubin 0.0 - 1.2 mg/dL   1.0   Alkaline Phos 38 - 126 U/L   65   AST 15 - 41 U/L   31   ALT 0 - 44 U/L   24     ASSESSMENT/PLAN:  Assessment: Principal Problem:   Postural dizziness with near syncope Active Problems:   Fall   Closed compression fracture of L3 lumbar vertebra, initial  encounter [facility])   ASCVD (arteriosclerotic cardiovascular disease)   Sleep trouble   Hypothyroid   Hypoxic respiratory failure (HCC)   Parkinson disease (HCC)  Laura Fritz is a 83 y.o. person living with a history of parkinson's disease, depression, fibromyalgia, and anxiety who presented after two falls on plavix  and is admitted for syncope workup on hospital day 1.  Plan: Postural dizziness with near syncope Parkinson's disease Orthostatic vital signs are pending, but may be challenging to test with her new fracture. She remains hemodynamically stable today. Carotid ultrasounds are pending. Still holding benadryl . She remains dehydrated on exam, so will start her on maintenance IV fluids today and reassess tomorrow. With her diagnosis of parkinson's disease, her syncopal events may be related to autonomic sequelae of progression of her parkinson's if further workup is unrevealing.   Hypoxia Patient continues to have a new oxygen requirement, but is down to 2L [place] today. She is not on O2 at home. She denies any current pulmonary symptoms. She does have some hyperinflation on prior CXR and a tobacco use history, but no prior PFTs or diagnosed lung diseases.  She may be splinting given her new compression fracture, so we will have her work with an incentive spirometer today and ambulate with PT/OT.   Recent falls Acute compression fracture of L3 She is currently wearing a back brace for her compression fracture. Her pain is well-controlled with tylenol  and oxycodone  5 mg q4h prn. Vitamin D  level was normal. We will also provide lidocaine  patches, voltaren gel, and aquathermia. May consider calcitonin if pain persists. Working with PT/OT today; she will likely need SNF placement.   Chronic conditions: Hypothyroidism: TSH was 0.112. We will decrease her dose to levothyroxine  25 mcg daily. Chronic diarrhea: Well-controlled on loperamide , which we are holding for now.  Depression: On lexapro  10 mg daily and bupropion  300 mg daily, continued.  Coronary artery disease: On rosuvastatin  20 mg daily, plavix  75 mg daily, continued. GERD: On omeprazole 20 mg daily, continued.    Best Practice: Diet: normal IVF: Fluids: LR 50cc/hr VTE: rivaroxaban  (XARELTO ) tablet 10 mg Start: 04/28/23 1445 Code: DNR AB: None Therapy Recs: pending Family Contact: son, to be notified. DISPO: Anticipated discharge in 1-3 days to Skilled nursing facility pending PT eval and syncope workup.  Signature: Ozell Riff, MD  Internal Medicine Resident, PGY-1 [facility]  Pager: [phone] 1:20 PM, 04/29/2023   Please contact the on call pager after 5 pm and on weekends at [phone].

## 2023-04-29 NOTE — ED Notes (Signed)
 Nasal 02 lowered to 4 iters/min

## 2023-04-29 NOTE — Evaluation (Signed)
 Occupational Therapy Evaluation Patient Details Name: Laura Fritz MRN: [id number] DOB: 1940/10/09 Today's Date: 04/29/2023   History of Present Illness Laura Fritz is an 83 year old female who presents after two falls at home.  CT revealed L3 comp fx.  Past medical history of Parkinson's disease, depression, fibromyalgia, and anxiety.   Clinical Impression   Pt currently at mod to max assist for donning TLSO in sitting and then mod assist for simulated LB selfcare sit to stand.  She needed min assist for toilet transfer stand pivot to the [facility] and for toileting with use of the RW for support.  Oxygen sats decreasing down to 84% on room air at rest while in bed eating with 2Ls supplemental O2 re-applied to increase back up to 90%.  BP in sitting at 129/76 with HR at 84-99 BPM.  Pt with increased nausea and one episode of emesis after completion of toilet transfer and transition to bedside recliner.  Nursing made aware and MDs in to see her at the time.  Pt currently lives alone and does not have consistent supervision.  She has recent and past history of falls and will benefit from acute care OT at this time in order to increase safety and completion of selfcare tasks.  Recommend 24 hour supervision for safety if discharging home but unsure if family can arrange.  May benefit from continued inpatient follow up therapy, <3 hours/day post acute stay to reach safe level for home alone.         If plan is discharge home, recommend the following: A little help with walking and/or transfers;Help with stairs or ramp for entrance;Assistance with cooking/housework;Assist for transportation;A little help with bathing/dressing/bathroom    Functional Status Assessment  Patient has had a recent decline in their functional status and demonstrates the ability to make significant improvements in function in a reasonable and predictable amount of time.  Equipment Recommendations  Other (comment) (TBD next venue  of care)       Precautions / Restrictions Precautions Precautions: Back;Fall Precaution Booklet Issued: No Required Braces or Orthoses: Spinal Brace Spinal Brace: Thoracolumbosacral orthotic;Applied in sitting position      Mobility Bed Mobility Overal bed mobility: Needs Assistance Bed Mobility: Rolling, Sidelying to Sit Rolling: Min assist, Used rails Sidelying to sit: Mod assist       General bed mobility comments: Mod assist to bring trunk up to sitting from sidelying    Transfers Overall transfer level: Needs assistance Equipment used: Rolling walker (2 wheels) Transfers: Sit to/from Stand, Bed to chair/wheelchair/BSC Sit to Stand: Min assist     Step pivot transfers: Min assist     General transfer comment: Mod instructional cueing for hand placement with sit to stand and stand to sit secondary to wanting to keep hands on the RW during all transitions.      Balance Overall balance assessment: Needs assistance Sitting-balance support: Bilateral upper extremity supported Sitting balance-Leahy Scale: Poor Sitting balance - Comments: Min assist initially secondary to posterior lean progressing to min guard/supervision   Standing balance support: Reliant on assistive device for balance, During functional activity Standing balance-Leahy Scale: Poor Standing balance comment: Pt needs BUE support for standing.                           ADL either performed or assessed with clinical judgement   ADL Overall ADL's : Needs assistance/impaired Eating/Feeding: Independent;Bed level   Grooming: Wash/dry hands;Wash/dry face;Sitting;Set up  Upper Body Bathing: Supervision/ safety;Sitting   Lower Body Bathing: Minimal assistance;Sit to/from stand   Upper Body Dressing : Maximal assistance;Sitting Upper Body Dressing Details (indicate cue type and reason): including TLSO     Toilet Transfer: Minimal assistance;BSC/3in1;Rolling walker (2 wheels)    Toileting- Clothing Manipulation and Hygiene: Minimal assistance;Sit to/from stand       Functional mobility during ADLs: Minimal assistance (stand pivot with use of the RW) General ADL Comments: Pt needed mod demonstrational cueing for bed mobility following proper technique of rolling and then bringing LEs off of the side of the bed and transitioning to sitting with mod assist.  BP in sitting at 129/76 with mod instructional cueing for hand placement during sit to stand with use of the RW. Pt with bowel urgency needing to transfer to the [facility].  Oxygen sats at 84% on room air to start session with pt in supine eating.  Placed back on 2-3 Ls to bring sats up to 90% for transfer to the [facility].     Vision Baseline Vision/History: 0 No visual deficits Ability to See in Adequate Light: 0 Adequate Patient Visual Report: No change from baseline Vision Assessment?: No apparent visual deficits     Perception Perception: Not tested       Praxis Praxis: Not tested       Pertinent Vitals/Pain Pain Assessment Pain Assessment: 0-10 Pain Score: 4  Pain Location: lower back Pain Descriptors / Indicators: Discomfort Pain Intervention(s): Limited activity within patient's tolerance, Repositioned, RN gave pain meds during session     Extremity/Trunk Assessment Upper Extremity Assessment Upper Extremity Assessment: Generalized weakness (Not formally assessed secondary to back pain, but pt able to use spontaneously for toileting and holding the RW)       Cervical / Trunk Assessment Cervical / Trunk Assessment: Kyphotic;Other exceptions Cervical / Trunk Exceptions: TLSO in place   Communication Communication Communication: No apparent difficulties Cueing Techniques: Verbal cues;Tactile cues   Cognition Arousal: Alert Behavior During Therapy: WFL for tasks assessed/performed Overall Cognitive Status: Within Functional Limits for tasks assessed                                  General Comments: Pt quite talkative and able to answer general questions about how she completed ADLs at home.  She reports having a fall and hurting her back and can recall how she fell.                Home Living Family/patient expects to be discharged to:: Private residence Living Arrangements: Alone (has her dog she's concerned a lot about) Available Help at Discharge:  (has daughter and son but they don't check on her regularly) Type of Home: House Home Access: Stairs to enter [employer] of Steps: 3   Home Layout: One level     Bathroom Shower/Tub: Producer, Television/film/video: Handicapped height (she has one standard and one lower toilet)     Home Equipment: Pharmacist, Hospital (2 wheels);Cane - single point   Additional Comments: She has groceries delivered and she took care of her laundry.      Prior Functioning/Environment Prior Level of Function : History of Falls (last six months);Independent/Modified Independent             Mobility Comments: Pt would furniture walk or hold onto the walls most of the time with mobility unless outside of the house, then she would  use her cane.  She has been using the RW the past few days since she is weaker.          OT Problem List: Decreased strength;Decreased knowledge of use of DME or AE;Decreased knowledge of precautions;Decreased activity tolerance;Impaired balance (sitting and/or standing);Decreased safety awareness;Pain      OT Treatment/Interventions: Self-care/ADL training;Patient/family education;Balance training;Therapeutic activities;DME and/or AE instruction    OT Goals(Current goals can be found in the care plan section) Acute Rehab OT Goals Patient Stated Goal: Pt wants to get back home to her dog. OT Goal Formulation: With patient Time For Goal Achievement: 05/13/23 Potential to Achieve Goals: Good  OT Frequency: Min 1X/week       AM-PAC OT 6 Clicks Daily Activity      Outcome Measure Help from another person eating meals?: None Help from another person taking care of personal grooming?: A Little Help from another person toileting, which includes using toliet, bedpan, or urinal?: A Little Help from another person bathing (including washing, rinsing, drying)?: A Little Help from another person to put on and taking off regular upper body clothing?: A Lot Help from another person to put on and taking off regular lower body clothing?: A Lot 6 Click Score: 17   End of Session Equipment Utilized During Treatment: Rolling walker (2 wheels);Back brace Nurse Communication: Mobility status;Other (comment) (nausea and need for pain meds)  Activity Tolerance: Other (comment);Patient tolerated treatment well (Limited by nausea) Patient left: in chair;with call bell/phone within reach;with nursing/sitter in room  OT Visit Diagnosis: Unsteadiness on feet (R26.81);Muscle weakness (generalized) (M62.81);Other abnormalities of gait and mobility (R26.89);Repeated falls (R29.6);Pain Pain - Right/Left:  (lower back)                Time: 9058-8949 OT Time Calculation (min): 69 min Charges:  OT General Charges $OT Visit: 1 Visit OT Evaluation $OT Eval Moderate Complexity: 1 Mod OT Treatments $Self Care/Home Management : 38-52 mins  Lynwood Constant, OTR/L Acute Rehabilitation Services  Office [phone] 04/29/2023

## 2023-04-30 DIAGNOSIS — S32030G Wedge compression fracture of third lumbar vertebra, subsequent encounter for fracture with delayed healing: Secondary | ICD-10-CM | POA: Diagnosis not present

## 2023-04-30 DIAGNOSIS — G20A1 Parkinson's disease without dyskinesia, without mention of fluctuations: Secondary | ICD-10-CM

## 2023-04-30 DIAGNOSIS — I2510 Atherosclerotic heart disease of native coronary artery without angina pectoris: Secondary | ICD-10-CM | POA: Diagnosis not present

## 2023-04-30 DIAGNOSIS — R42 Dizziness and giddiness: Secondary | ICD-10-CM | POA: Diagnosis not present

## 2023-04-30 DIAGNOSIS — R55 Syncope and collapse: Secondary | ICD-10-CM | POA: Diagnosis not present

## 2023-04-30 NOTE — Progress Notes (Signed)
 HD#1 SUBJECTIVE:  Patient Summary: Laura Fritz is a 83 y.o. female with a pertinent PMH of parkinson's disease, depression, fibromyalgia, and anxiety who presented after two falls on plavix  and is admitted for syncope workup .   Overnight Events: None  Interim History: Patient evaluated at bedside. Feels sleepy this morning, pain is 2/10. No acute concerns. Spoke with her about the importance of incentive spirometry and showed her how to use it.   OBJECTIVE:  Vital Signs: Vitals:   04/30/23 0956 04/30/23 0959 04/30/23 1000 04/30/23 1128  BP: 105/80 (!) 83/63 (!) 81/47 120/67  Pulse:      Resp:      Temp:      TempSrc:      SpO2:      Weight:      Height:       Supplemental O2: Nasal Cannula SpO2: 96 % O2 Flow Rate (L/min): 2 L/min FiO2 (%): 100 %  Filed Weights   04/28/23 0955  Weight: 68 kg   Physical Exam: General: well-appearing, in no acute distress, laying in bed, on 2L [place] Cardiac: regular rate and rhythm, no murmurs, euvolemic on exam Pulmonary: CTA bilaterally, normal effort and rate Abdomen: soft, non-tender, non-distended Neuro: Alert and oriented. Pain well-controlled in lumbar region. No pain with leg raise bilaterally. No neuropathic symptoms or focal deficits.  Psych: Normal mood and affect  Patient Lines/Drains/Airways Status     Active Line/Drains/Airways     Name Placement date Placement time Site Days   Peripheral IV 04/16/21 20 G Left Antecubital 04/16/21  1631  Antecubital  743   Peripheral IV 04/28/23 20 G Anterior;Left;Proximal Forearm 04/28/23  0930  Forearm  1   Incision (Closed) 03/31/21 Shoulder Right 03/31/21  1813  -- 759            Pertinent Labs:    Latest Ref Rng & Units 04/29/2023    6:14 AM 04/28/2023    9:59 AM 04/28/2023    9:51 AM  CBC  WBC 4.0 - 10.5 K/uL 7.2   8.8   Hemoglobin 12.0 - 15.0 g/dL 86.6  84.9  86.1   Hematocrit 36.0 - 46.0 % 40.9  44.0  42.5   Platelets 150 - 400 K/uL 149   165        Latest Ref Rng &  Units 04/29/2023    6:14 AM 04/28/2023    9:59 AM 04/28/2023    9:51 AM  CMP  Glucose 70 - 99 mg/dL 81  98  897   BUN 8 - 23 mg/dL 16  19  17    Creatinine 0.44 - 1.00 mg/dL 9.00  8.89  8.89   Sodium 135 - 145 mmol/L 140  142  139   Potassium 3.5 - 5.1 mmol/L 4.2  3.6  3.6   Chloride 98 - 111 mmol/L 107  105  104   CO2 22 - 32 mmol/L 24   23   Calcium  8.9 - 10.3 mg/dL 8.9   9.2   Total Protein 6.5 - 8.1 g/dL   6.1   Total Bilirubin 0.0 - 1.2 mg/dL   1.0   Alkaline Phos 38 - 126 U/L   65   AST 15 - 41 U/L   31   ALT 0 - 44 U/L   24     ASSESSMENT/PLAN:  Assessment: Principal Problem:   Postural dizziness with near syncope Active Problems:   Fall   Closed compression fracture of L3 lumbar vertebra, initial encounter (  HCC)   ASCVD (arteriosclerotic cardiovascular disease)   Sleep trouble   Hypothyroid   Hypoxic respiratory failure (HCC)   Parkinson disease (HCC)   Syncope  Laura Fritz is a 83 y.o. person living with a history of parkinson's disease, depression, fibromyalgia, and anxiety who presented after two falls on plavix  and is admitted for syncope workup on hospital day 2.  Plan: Postural dizziness with near syncope Parkinson's disease Orthostatic vital signs were positive. We spoke with her about this and our recommendations to include more salt in her diet, to drink plenty of fluids, and to wear compression stockings to avoid orthostatic dizziness. Carotid ultrasounds were unremarkable. She appears euvolemic on exam. This may also be related to autonomic sequelae of progression of her parkinson's disease.   Hypoxia Most likely secondary to splinting. Patient is satting okay on 2L [place]. She denies any current pulmonary symptoms. She has been using the incentive spirometer diligently, but incorrectly, so we showed her proper technique and encouraged her to do it every hour. She remains afebrile with no clinical signs of infection. She will need outpatient PFTs at  discharge.  Recurrent falls Acute compression fracture of L3 She is currently wearing a back brace for her compression fracture. Her pain is well-controlled with topical analgesics, tylenol  and oxycodone  5 mg q4h prn. She continues to work with PT/OT. Waiting for SNF placement.   Chronic conditions: Hypothyroidism: TSH was 0.112. Levothyroxine  decreased to 25 mcg daily. Chronic diarrhea: Well-controlled on loperamide . Depression: On lexapro  10 mg daily and bupropion  300 mg daily, continued.  Coronary artery disease: On rosuvastatin  20 mg daily, plavix  75 mg daily, continued. GERD: On omeprazole 20 mg daily, continued.    Best Practice: Diet: normal IVF: Fluids: None VTE: Place TED hose Start: 04/30/23 1045 rivaroxaban  (XARELTO ) tablet 10 mg Start: 04/28/23 1445 Code: DNR AB: None Therapy Recs: SNF Family Contact: son, to be notified. DISPO: Anticipated discharge in 1-3 days to Skilled nursing facility pending return to room air and SNF placement.  Signature: Ozell Riff, MD  Internal Medicine Resident, PGY-1 [facility]  Pager: [phone] 11:59 AM, 04/30/2023   Please contact the on call pager after 5 pm and on weekends at [phone].

## 2023-04-30 NOTE — NC FL2 (Addendum)
 [place]  MEDICAID FL2 LEVEL OF CARE FORM     IDENTIFICATION  Patient Name: Laura Fritz Birthdate: 06/18/1940 Sex: female Admission Date (Current Location): 04/28/2023  [facility] and [place] Number:  Producer, Television/film/video and Address:  [facility] [place], [address] [place], [place] [address]      Provider Number: [id number]  Attending Physician Name and Address:  Rosan Dayton BROCKS, DO  Relative Name and Phone Number:  Fernanda Alm Rhody)  [phone]    Current Level of Care: Hospital Recommended Level of Care: Skilled Nursing Facility Prior Approval Number:    Date Approved/Denied:   PASRR Number: [id number]  Expires 05/31/23  Discharge Plan: SNF    Current Diagnoses: Patient Active Problem List   Diagnosis Date Noted   Syncope 04/29/2023   Postural dizziness with near syncope 04/28/2023   Fall 04/28/2023   Closed compression fracture of L3 lumbar vertebra, initial encounter (HCC) 04/28/2023   ASCVD (arteriosclerotic cardiovascular disease) 04/28/2023   Sleep trouble 04/28/2023   Hypothyroid 04/28/2023   Hypoxic respiratory failure (HCC) 04/28/2023   Parkinson disease (HCC) 04/28/2023   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   Chronic RLQ pain 09/07/2020   Chronic diarrhea 09/07/2020   Major depressive disorder, 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   Opiate dependence (HCC) 05/14/2016    Orientation RESPIRATION BLADDER Height & Weight     Self, Situation, Place, Time  O2 Continent Weight: 149 lb 14.6 oz (68 kg) Height:  5' 3 (160 cm)  BEHAVIORAL SYMPTOMS/MOOD NEUROLOGICAL BOWEL NUTRITION STATUS      Continent Diet (see DC summary)  AMBULATORY STATUS COMMUNICATION OF NEEDS Skin   Limited Assist Verbally                         Personal Care Assistance Level of Assistance  Bathing, Feeding, Dressing Bathing Assistance: Limited assistance Feeding assistance:  Independent Dressing Assistance: Limited assistance     Functional Limitations Info  Sight, Hearing, Speech Sight Info: Impaired Hearing Info: Impaired Speech Info: Adequate    SPECIAL CARE FACTORS FREQUENCY  PT (By licensed PT), OT (By licensed OT)     PT Frequency: 5x/week OT Frequency: 5x/week            Contractures Contractures Info: Not present    Additional Factors Info  Code Status, Allergies Code Status Info: DNR Allergies Info: Tramadol  Budesonide   Levofloxacin  Mirabegron  Mirabegron Er  Nsaids  Sulfa Antibiotics  Amoxicillin  Nystatin           Current Medications (04/30/2023):  This is the current hospital active medication list Current Facility-Administered Medications  Medication Dose Route Frequency Provider Last Rate Last Admin   acetaminophen  (TYLENOL ) tablet 1,000 mg  1,000 mg Oral Q6H McLendon, Michael, MD   1,000 mg at 04/30/23 1126   buPROPion  (WELLBUTRIN  XL) 24 hr tablet 300 mg  300 mg Oral q morning McLendon, Michael, MD   300 mg at 04/30/23 0854   clopidogrel  (PLAVIX ) tablet 75 mg  75 mg Oral Daily McLendon, Michael, MD   75 mg at 04/30/23 0854   escitalopram  (LEXAPRO ) tablet 10 mg  10 mg Oral Daily McLendon, Michael, MD   10 mg at 04/30/23 0854   gabapentin  (NEURONTIN ) capsule 900 mg  900 mg Oral QHS McLendon, Michael, MD   900 mg at 04/29/23 2252   levothyroxine  (SYNTHROID ) tablet 25 mcg  25 mcg Oral Q0600 McLendon, Michael, MD  25 mcg at 04/30/23 0548   lidocaine  (LIDODERM ) 5 % 1 patch  1 patch Transdermal Q24H McLendon, Michael, MD   1 patch at 04/30/23 1126   loperamide  (IMODIUM ) capsule 2 mg  2 mg Oral Q8H PRN Youssefzadeh, Keon, MD   2 mg at 04/29/23 2251   loratadine  (CLARITIN ) tablet 10 mg  10 mg Oral Daily PRN Gregary Sharper, MD       oxyCODONE  (Oxy IR/ROXICODONE ) immediate release tablet 5 mg  5 mg Oral Q4H PRN McLendon, Michael, MD   5 mg at 04/29/23 2032   pantoprazole  (PROTONIX ) EC tablet 40 mg  40 mg Oral Daily McLendon, Michael,  MD   40 mg at 04/30/23 0854   polyethylene glycol (MIRALAX  / GLYCOLAX ) packet 17 g  17 g Oral Daily McLendon, Michael, MD   17 g at 04/30/23 0854   rivaroxaban  (XARELTO ) tablet 10 mg  10 mg Oral Daily McLendon, Michael, MD   10 mg at 04/30/23 9145   rosuvastatin  (CRESTOR ) tablet 20 mg  20 mg Oral QHS McLendon, Michael, MD   20 mg at 04/29/23 2252   traZODone  (DESYREL ) tablet 50 mg  50 mg Oral QHS PRN McLendon, Michael, MD   50 mg at 04/29/23 2318     Discharge Medications: Please see discharge summary for a list of discharge medications.  Relevant Imaging Results:  Relevant Lab Results:   Additional Information SSN: [id number]  Oliverio Cho A Contessa Preuss, LCSWA

## 2023-04-30 NOTE — Plan of Care (Signed)

## 2023-04-30 NOTE — Evaluation (Signed)
 Physical Therapy Evaluation Patient Details Name: Laura Fritz MRN: [id number] DOB: [birth year] Today's Date: 04/30/2023  History of Present Illness  83 year old female who presents 1/5after two falls at home.  CT revealed L3 comp fx.  Past medical history of Parkinson's disease, depression, fibromyalgia, and anxiety.  Clinical Impression  Pt admitted with above diagnosis. Very pleasant, independent with assistive devices PTA. Required up to mod assist for bed mobility, min assist for transfer and min assist for gait with RW to help with stability. Reviewed precautions and brace use. Ambulating short distance in room, having some radicular symptoms into Rt buttock/posterior thigh. Denies bladder changes, able to void large volume in toilet during session. Patient will benefit from continued inpatient follow up therapy, <3 hours/day. Pt currently with functional limitations due to the deficits listed below (see PT Problem List). Pt will benefit from acute skilled PT to increase their independence and safety with mobility to allow discharge.           If plan is discharge home, recommend the following: A little help with walking and/or transfers;A little help with bathing/dressing/bathroom;Assistance with cooking/housework;Assist for transportation;Help with stairs or ramp for entrance   Can travel by private vehicle   Yes    Equipment Recommendations None recommended by PT  Recommendations for Other Services       Functional Status Assessment Patient has had a recent decline in their functional status and demonstrates the ability to make significant improvements in function in a reasonable and predictable amount of time.     Precautions / Restrictions Precautions Precautions: Back;Fall Precaution Booklet Issued: No Required Braces or Orthoses: Spinal Brace Spinal Brace: Thoracolumbosacral orthotic;Applied in sitting position Restrictions Weight Bearing Restrictions Per Provider Order:  No      Mobility  Bed Mobility Overal bed mobility: Needs Assistance Bed Mobility: Rolling, Sidelying to Sit Rolling: Min assist, Used rails Sidelying to sit: Mod assist       General bed mobility comments: Min assist to roll using rail, mod assist for trunk support. VC for log roll technique and transition to EOB with precautions.    Transfers Overall transfer level: Needs assistance Equipment used: Rolling walker (2 wheels) Transfers: Sit to/from Stand Sit to Stand: Min assist           General transfer comment: Min assist for boost to stand from bed and toilet. Cues for hand placement. Good control with descent.    Ambulation/Gait Ambulation/Gait assistance: Contact guard assist Gait Distance (Feet): 15 Feet (+20) Assistive device: Rolling walker (2 wheels) Gait Pattern/deviations: Step-through pattern, Decreased stride length, Decreased stance time - right, Antalgic, Trunk flexed Gait velocity: dec Gait velocity interpretation: <1.31 ft/sec, indicative of household ambulator   General Gait Details: Educated on safe AD use, cues for upright posture. CGA for safety with gait, shows mild instability when navigating congested areas. No overt buckling. Reports pain when lifting RLE.  Stairs            Wheelchair Mobility     Tilt Bed    Modified Rankin (Stroke Patients Only)       Balance Overall balance assessment: Needs assistance Sitting-balance support: Bilateral upper extremity supported Sitting balance-Leahy Scale: Poor Sitting balance - Comments: Sits CGA   Standing balance support: Reliant on assistive device for balance, During functional activity Standing balance-Leahy Scale: Poor Standing balance comment: Pt needs BUE support for standing.  Pertinent Vitals/Pain Pain Assessment Pain Assessment: Faces Faces Pain Scale: Hurts even more Pain Location: lower back with certain movement. Pain  Descriptors / Indicators: Discomfort Pain Intervention(s): Monitored during session    Home Living Family/patient expects to be discharged to:: Private residence Living Arrangements: Alone (has a dog) Available Help at Discharge: Family;Other (Comment) (has daughter and son but they don't check on her regularly) Type of Home: House Home Access: Stairs to enter Entrance Stairs-Rails: Doctor, General Practice of Steps: 3   Home Layout: One level Home Equipment: Pharmacist, Hospital (2 wheels);Cane - single point Additional Comments: She has groceries delivered and she took care of her laundry.    Prior Function Prior Level of Function : History of Falls (last six months);Independent/Modified Independent             Mobility Comments: Pt would furniture walk or hold onto the walls most of the time with mobility unless outside of the house, then she would use her cane.  She has been using the RW the past few days since she is weaker.       Extremity/Trunk Assessment   Upper Extremity Assessment Upper Extremity Assessment: Defer to OT evaluation    Lower Extremity Assessment Lower Extremity Assessment: Generalized weakness (Reports numbness Rt buttock to thigh)    Cervical / Trunk Assessment Cervical / Trunk Assessment: Kyphotic;Other exceptions Cervical / Trunk Exceptions: TLSO in place  Communication   Communication Communication: No apparent difficulties  Cognition Arousal: Alert Behavior During Therapy: WFL for tasks assessed/performed Overall Cognitive Status: Within Functional Limits for tasks assessed                                          General Comments General comments (skin integrity, edema, etc.): nausea after sitting down in recliner. BP 117/74 HR 77.    Exercises     Assessment/Plan    PT Assessment Patient needs continued PT services  PT Problem List Decreased strength;Decreased range of motion;Decreased activity  tolerance;Decreased balance;Decreased mobility;Decreased knowledge of use of DME;Decreased knowledge of precautions;Pain       PT Treatment Interventions DME instruction;Gait training;Functional mobility training;Therapeutic activities;Therapeutic exercise;Balance training;Neuromuscular re-education;Patient/family education    PT Goals (Current goals can be found in the Care Plan section)  Acute Rehab PT Goals Patient Stated Goal: Get well, be with her dog PT Goal Formulation: With patient Time For Goal Achievement: 05/14/23 Potential to Achieve Goals: Good    Frequency Min 1X/week     Co-evaluation               AM-PAC PT 6 Clicks Mobility  Outcome Measure Help needed turning from your back to your side while in a flat bed without using bedrails?: A Little Help needed moving from lying on your back to sitting on the side of a flat bed without using bedrails?: A Little Help needed moving to and from a bed to a chair (including a wheelchair)?: A Little Help needed standing up from a chair using your arms (e.g., wheelchair or bedside chair)?: A Little Help needed to walk in hospital room?: A Little Help needed climbing 3-5 steps with a railing? : A Lot 6 Click Score: 17    End of Session Equipment Utilized During Treatment: Gait belt;Back brace Activity Tolerance: Patient tolerated treatment well Patient left: in chair;with call bell/phone within reach;with chair alarm set Nurse Communication: Mobility status PT  Visit Diagnosis: Unsteadiness on feet (R26.81);Other abnormalities of gait and mobility (R26.89);Muscle weakness (generalized) (M62.81);History of falling (Z91.81);Pain Pain - part of body:  (back)    Time: 1152-1229 PT Time Calculation (min) (ACUTE ONLY): 37 min   Charges:   PT Evaluation $PT Eval Low Complexity: 1 Low PT Treatments $Therapeutic Activity: 8-22 mins PT General Charges $$ ACUTE PT VISIT: 1 Visit         Leontine Roads, PT, DPT [facility]  Health  Rehabilitation Services Physical Therapist Office: [phone] Website: [email or web address]   Leontine GORMAN Roads 04/30/2023, 1:11 PM

## 2023-04-30 NOTE — Progress Notes (Signed)
 Name: Laura Fritz DOB: July 18, 1940  Please be advised that the above-named patient will require a short-term nursing home stay -- anticipated 30 days or less for rehabilitation and strengthening. The plan is for return home.

## 2023-05-01 DIAGNOSIS — R55 Syncope and collapse: Secondary | ICD-10-CM | POA: Diagnosis not present

## 2023-05-01 DIAGNOSIS — R42 Dizziness and giddiness: Secondary | ICD-10-CM | POA: Diagnosis not present

## 2023-05-01 NOTE — Plan of Care (Signed)

## 2023-05-01 NOTE — Progress Notes (Signed)
 HD#2 SUBJECTIVE:  Patient Summary: Laura Fritz is a 83 y.o. female with a pertinent PMH of parkinson's disease, depression, fibromyalgia, and anxiety who presented after two falls on plavix  and is admitted for syncope workup .   Overnight Events: None  Interim History: Patient evaluated at bedside. On room air. Sitting in chair wearing back brace. Feels she is doing well. No new concerns this morning.   OBJECTIVE:  Vital Signs: Vitals:   05/01/23 0535 05/01/23 0759 05/01/23 1000 05/01/23 1220  BP: 129/61 108/61  139/73  Pulse: 61 65  76  Resp: 18     Temp: 97.8 F (36.6 C) 98.4 F (36.9 C)  (!) 97.5 F (36.4 C)  TempSrc: Oral Oral  Oral  SpO2: 97% 95% 94% 91%  Weight:      Height:       Supplemental O2: Nasal Cannula SpO2: 91 % O2 Flow Rate (L/min): 2 L/min FiO2 (%): 100 %  Filed Weights   04/28/23 0955  Weight: 68 kg   Physical Exam: General: well-appearing, in no acute distress, sitting in chair on RA, wearing back brace Cardiac: regular rate and rhythm, no murmurs; remains euvolemic Pulmonary: CTA bilaterally, normal effort and rate Neuro: Alert and oriented x 3. No focal neuro deficits.  Psych: Normal mood and affect  Pertinent Labs:    Latest Ref Rng & Units 04/29/2023    6:14 AM 04/28/2023    9:59 AM 04/28/2023    9:51 AM  CBC  WBC 4.0 - 10.5 K/uL 7.2   8.8   Hemoglobin 12.0 - 15.0 g/dL 86.6  84.9  86.1   Hematocrit 36.0 - 46.0 % 40.9  44.0  42.5   Platelets 150 - 400 K/uL 149   165       Latest Ref Rng & Units 04/29/2023    6:14 AM 04/28/2023    9:59 AM 04/28/2023    9:51 AM  CMP  Glucose 70 - 99 mg/dL 81  98  897   BUN 8 - 23 mg/dL 16  19  17    Creatinine 0.44 - 1.00 mg/dL 9.00  8.89  8.89   Sodium 135 - 145 mmol/L 140  142  139   Potassium 3.5 - 5.1 mmol/L 4.2  3.6  3.6   Chloride 98 - 111 mmol/L 107  105  104   CO2 22 - 32 mmol/L 24   23   Calcium  8.9 - 10.3 mg/dL 8.9   9.2   Total Protein 6.5 - 8.1 g/dL   6.1   Total Bilirubin 0.0 - 1.2 mg/dL    1.0   Alkaline Phos 38 - 126 U/L   65   AST 15 - 41 U/L   31   ALT 0 - 44 U/L   24     ASSESSMENT/PLAN:  Assessment: Principal Problem:   Postural dizziness with near syncope Active Problems:   Fall   Closed compression fracture of L3 lumbar vertebra, initial encounter (HCC)   ASCVD (arteriosclerotic cardiovascular disease)   Sleep trouble   Hypothyroid   Hypoxic respiratory failure (HCC)   Parkinson disease (HCC)   Syncope  Laura Fritz is a 83 y.o. person living with a history of parkinson's disease, depression, fibromyalgia, and anxiety who presented after two falls on plavix  and is admitted for syncope workup on hospital day 3.  Plan: Recurrent falls Acute compression fracture of L3 Her pain remains well-controlled with topical analgesics, tylenol  and oxycodone  5 mg q4h prn. She is working  with PT and OT regularly to regain strength and mobility, with the plan to go to a SNF pending placement. She is medically stable for discharge.   Postural dizziness with near syncope Parkinson's disease Falls are likely secondary to orthostasis, which may be related to progression of parkinson's. Patient has ted hose, will benefit from compression stockings after discharge. Plan to also liberalize salt and fluid intake outpatient. No new concerns.   Hypoxia, resolved Most likely secondary to splinting. She has been diligent with her incentive spirometer and is now on room air and her oxygen saturation has been normal. Would benefit from PFTs at discharge for assessment of underlying lung disease.   Chronic conditions: Hypothyroidism: TSH was 0.112. Levothyroxine  decreased to 25 mcg daily. Chronic diarrhea: Well-controlled on loperamide . Depression: On lexapro  10 mg daily and bupropion  300 mg daily, continued.  Coronary artery disease: On rosuvastatin  20 mg daily, plavix  75 mg daily, continued. GERD: On omeprazole 20 mg daily, continued.    Best Practice: Diet: normal IVF: Fluids:  None VTE: Place TED hose Start: 04/30/23 1045 rivaroxaban  (XARELTO ) tablet 10 mg Start: 04/28/23 1445 Code: DNR AB: None Therapy Recs: SNF Family Contact: son, to be notified. DISPO: Medically stable for discharge to SNF.   Signature: Ozell Riff, MD  Internal Medicine Resident, PGY-1 [facility]  Pager: [phone] 1:20 PM, 05/01/2023   Please contact the on call pager after 5 pm and on weekends at [phone].

## 2023-05-01 NOTE — TOC Progression Note (Signed)
 Transition of Care [facility]) - Progression Note    Patient Details  Name: Laura Fritz MRN: [id number] Date of Birth: 05-Jun-1940  [facility] CM/SW Contact  Tahari Clabaugh A Jermy Couper, LCSWA Phone Number: 05/01/2023, 2:45 PM  Clinical Narrative:     CSW met with pt to provide bed offers with Medicare.gov ratings. She discuss with her son and make a decision on a facility and let CSW know. CSW contact information provided.   TOC will continue to follow.         Expected Discharge Plan and Services                                               Social Determinants of Health (SDOH) Interventions SDOH Screenings   Food Insecurity: No Food Insecurity (04/29/2023)  Housing: Low Risk  (04/29/2023)  Transportation Needs: No Transportation Needs (04/29/2023)  Utilities: Not At Risk (04/29/2023)  Financial Resource Strain: Low Risk  (04/01/2023)   Received from [facility]  Recent Concern: Financial Resource Strain - Medium Risk (01/23/2023)   Received from [facility]  Physical Activity: Insufficiently Active (04/01/2023)   Received from [facility]  Social Connections: Moderately Isolated (04/29/2023)  Stress: Stress Concern Present (04/01/2023)   Received [facility]  Tobacco Use: Medium Risk (04/29/2023)    Readmission Risk Interventions     No data to display

## 2023-05-01 NOTE — Progress Notes (Signed)
 Occupational Therapy Treatment Patient Details Name: Laura Fritz MRN: [id number] DOB: [birth year] Today's Date: 05/01/2023   History of present illness 83 year old female who presents 1/5after two falls at home.  CT revealed L3 comp fx.  Past medical history of Parkinson's disease, depression, fibromyalgia, and anxiety.   OT comments  Pt currently still mod assist for sit to stand initially from EOB progressing to min assist when getting up from a chair with arm rests.  Max assist for donning TLSO in sitting.  Oxygen sats at 91% on room air at rest increasing up to 96%  with mobility.  HR elevating up to 99 BPM with dyspnea at 2/4 with mobility.  Pt still with increased pain in her lower back throughout session but gets relief with wearing of TLSO.  Feel she is progressing well, recommend continued acute care OT to work on strengthening, balance, and ADL independence adhering to precautions.  Feel she will need continued inpatient follow up therapy, <3 hours/day post acute stay as she does not have 24 hour supervision post discharge.           If plan is discharge home, recommend the following:  A little help with walking and/or transfers;Help with stairs or ramp for entrance;Assistance with cooking/housework;Assist for transportation;A little help with bathing/dressing/bathroom   Equipment Recommendations  Other (comment) (TBD next venue of care)       Precautions / Restrictions Precautions Precautions: Back;Fall Required Braces or Orthoses: Spinal Brace Spinal Brace: Thoracolumbosacral orthotic;Applied in sitting position Restrictions Weight Bearing Restrictions Per Provider Order: No       Mobility Bed Mobility Overal bed mobility: Needs Assistance Bed Mobility: Rolling, Sidelying to Sit Rolling: Min assist, Used rails Sidelying to sit: Mod assist       General bed mobility comments: Mod demonstrational cueing for following back precautions with mod assist for transition from  sidelying to sitting.    Transfers Overall transfer level: Needs assistance Equipment used: Rolling walker (2 wheels) Transfers: Sit to/from Stand Sit to Stand: Mod assist     Step pivot transfers: Min assist     General transfer comment: Mod assist for initial sit to stand from the EOB with min assist for sit to stand from the bedside chair.  Mod demonstrational cueing for hand placement during transitons sit to stand and stand to sit.     Balance Overall balance assessment: Needs assistance Sitting-balance support: Bilateral upper extremity supported Sitting balance-Leahy Scale: Fair Sitting balance - Comments: Able to sit with close supervision once scooted out to the EOB   Standing balance support: Reliant on assistive device for balance, During functional activity, Bilateral upper extremity supported Standing balance-Leahy Scale: Poor Standing balance comment: Pt needs BUE support for standing with use of the RW                           ADL either performed or assessed with clinical judgement   ADL Overall ADL's : Needs assistance/impaired                 Upper Body Dressing : Sitting;Maximal assistance Upper Body Dressing Details (indicate cue type and reason): donning TLSO in sitting     Toilet Transfer: Minimal assistance;Ambulation;Stand-pivot;Rolling walker (2 wheels) Toilet Transfer Details (indicate cue type and reason): simulated         Functional mobility during ADLs: Minimal assistance;Rolling walker (2 wheels) General ADL Comments: Pt needed mod demonstrational cueing for sequencing supine to  sit following back precautions with overall mod assist this session.  Worked on education with donning and doffing TLSO in sitting.  Overall, needs max assist with max demonstrational cueing.  Oxygen sats at rest 91-93% on room air.  With mobility they increased up to 96% with HR increasing up to 99 BPM.  some increased pain this session so positioned  in bedside chair at end of session with LEs elevated and pillow behind back to support upright posture.      Cognition Arousal: Alert Behavior During Therapy: WFL for tasks assessed/performed Overall Cognitive Status: No family/caregiver present to determine baseline cognitive functioning                                 General Comments: Pt with decreased carryover of techniques for sit to stand but oriented to place, day of the week, and situation.                   Pertinent Vitals/ Pain       Pain Assessment Pain Assessment: Faces Faces Pain Scale: Hurts even more Pain Location: lower back Pain Descriptors / Indicators: Discomfort Pain Intervention(s): Limited activity within patient's tolerance, Repositioned, Monitored during session         Frequency  Min 1X/week        Progress Toward Goals  OT Goals(current goals can now be found in the care plan section)  Progress towards OT goals: Progressing toward goals  Acute Rehab OT Goals Patient Stated Goal: Pt agreeable to OT and knows she needs to go to rehab. OT Goal Formulation: With patient Time For Goal Achievement: 05/13/23 Potential to Achieve Goals: Good  Plan         AM-PAC OT 6 Clicks Daily Activity     Outcome Measure   Help from another person eating meals?: None Help from another person taking care of personal grooming?: A Little Help from another person toileting, which includes using toliet, bedpan, or urinal?: A Little Help from another person bathing (including washing, rinsing, drying)?: A Little Help from another person to put on and taking off regular upper body clothing?: A Lot Help from another person to put on and taking off regular lower body clothing?: A Lot 6 Click Score: 17    End of Session Equipment Utilized During Treatment: Rolling walker (2 wheels);Back brace  OT Visit Diagnosis: Unsteadiness on feet (R26.81);Muscle weakness (generalized) (M62.81);Other  abnormalities of gait and mobility (R26.89);Repeated falls (R29.6);Pain Pain - Right/Left:  (lower back pain)   Activity Tolerance Other (comment);Patient tolerated treatment well   Patient Left in chair;with call bell/phone within reach;with nursing/sitter in room   Nurse Communication Mobility status;Other (comment) (O2 sats with mobility)        Time: 8993-8953 OT Time Calculation (min): 40 min  Charges: OT General Charges $OT Visit: 1 Visit OT Treatments $Self Care/Home Management : 38-52 mins  Lynwood Constant, OTR/L Acute Rehabilitation Services  Office [phone] 05/01/2023

## 2023-05-02 DIAGNOSIS — R42 Dizziness and giddiness: Secondary | ICD-10-CM | POA: Diagnosis not present

## 2023-05-02 DIAGNOSIS — R55 Syncope and collapse: Secondary | ICD-10-CM | POA: Diagnosis not present

## 2023-05-02 NOTE — Progress Notes (Signed)
 Mobility Specialist Progress Note:   05/02/23 1114  Mobility  Activity Ambulated with assistance to bathroom  Level of Assistance Minimal assist, patient does 75% or more  Assistive Device Front wheel walker  Distance Ambulated (ft) 12 ft  Activity Response Tolerated well  Mobility Referral Yes  Mobility visit 1 Mobility  Mobility Specialist Start Time (ACUTE ONLY) 1105  Mobility Specialist Stop Time (ACUTE ONLY) 1113  Mobility Specialist Time Calculation (min) (ACUTE ONLY) 8 min   Pt received in bed, requesting assistance to bathroom. Required MinA to sit EOB and stand, CGA to ambulate. Tolerated well, c/o dizziness when first sitting EOB, but this resolved. Pt also c/o lower back pain. Left pt in bathroom, educated to pull bathroom call light when finished.   Billey Wojciak Mobility Specialist Please contact via Special Educational Needs Teacher or  Rehab office at [phone]

## 2023-05-02 NOTE — Progress Notes (Signed)
 Physical Therapy Treatment Patient Details Name: Laura Fritz MRN: [id number] DOB: 1940/07/05 Today's Date: 05/02/2023   History of Present Illness 83 year old female who presents 1/5after two falls at home.  CT revealed L3 comp fx.  Past medical history of Parkinson's disease, depression, fibromyalgia, and anxiety.    PT Comments  Making progress towards functional goals. Min assist to roll, mod assist to rise from sidelying position, cues to maintain back precautions via log roll technique. Max assist to [employer]. Min assist stand from EOB with RW for support. Still getting a little dizzy with transitions however after walking in room denied dizziness today. Reviewed LE exercises. Patient will benefit from continued inpatient follow up therapy, <3 hours/day. Patient will continue to benefit from skilled physical therapy services to further improve independence with functional mobility.  Reports she has been getting dizzy for quite some time at home when she first rises in the mornings.   05/02/23 1533  Orthostatic Lying   BP- Lying 108/59  Pulse- Lying 73  Orthostatic Sitting  BP- Sitting 99/74  Pulse- Sitting 93  Orthostatic Standing at 0 minutes  BP- Standing at 0 minutes 91/67  Pulse- Standing at 0 minutes 105  Orthostatic Standing at 3 minutes  BP- Standing at 3 minutes 114/69  Pulse- Standing at 3 minutes 115       If plan is discharge home, recommend the following: A little help with walking and/or transfers;A little help with bathing/dressing/bathroom;Assistance with cooking/housework;Assist for transportation;Help with stairs or ramp for entrance   Can travel by private vehicle     Yes  Equipment Recommendations  None recommended by PT    Recommendations for Other Services       Precautions / Restrictions Precautions Precautions: Back;Fall Precaution Booklet Issued: No Required Braces or Orthoses: Spinal Brace Spinal Brace: Thoracolumbosacral orthotic;Applied  in sitting position Restrictions Weight Bearing Restrictions Per Provider Order: No     Mobility  Bed Mobility Overal bed mobility: Needs Assistance Bed Mobility: Rolling, Sidelying to Sit Rolling: Min assist, Used rails Sidelying to sit: Mod assist       General bed mobility comments: Min assist with review of log roll technique for back precaution. Used rail. Mod assist for trunk support to rise. Pt was able to advance LEs off bed.    Transfers Overall transfer level: Needs assistance Equipment used: Rolling walker (2 wheels) Transfers: Sit to/from Stand Sit to Stand: Min assist           General transfer comment: Min assist for boost to stand. Slow and effortful. Cues for technique and hand placement. Good control with descent lowering into recliner.    Ambulation/Gait Ambulation/Gait assistance: Contact guard assist Gait Distance (Feet): 28 Feet Assistive device: Rolling walker (2 wheels) Gait Pattern/deviations: Step-through pattern, Decreased stride length, Decreased stance time - right, Trunk flexed Gait velocity: dec Gait velocity interpretation: <1.31 ft/sec, indicative of household ambulator   General Gait Details: Stable but slow and guarded. Better step symmetry today and denies radicular symptoms into Rt LE. Minor difficulty navigating around furniture with RW. Cues for technique. CGA for safety.   Stairs             Wheelchair Mobility     Tilt Bed    Modified Rankin (Stroke Patients Only)       Balance Overall balance assessment: Needs assistance Sitting-balance support: Single extremity supported Sitting balance-Leahy Scale: Fair     Standing balance support: Reliant on assistive device for balance, During functional  activity, Bilateral upper extremity supported Standing balance-Leahy Scale: Poor Standing balance comment: Pt needs BUE support for standing with use of the RW                            Cognition Arousal:  Alert Behavior During Therapy: WFL for tasks assessed/performed Overall Cognitive Status: Within Functional Limits for tasks assessed                                          Exercises General Exercises - Lower Extremity Ankle Circles/Pumps: AROM, Both, 15 reps, Seated Gluteal Sets: Strengthening, Both, 10 reps, Seated Long Arc Quad: Strengthening, Both, 10 reps, Seated    General Comments General comments (skin integrity, edema, etc.): See orthostatics vitals tab. Still getting dizzy with transitions.      Pertinent Vitals/Pain Pain Assessment Pain Assessment: Faces Faces Pain Scale: Hurts even more Pain Location: lower back Pain Descriptors / Indicators: Discomfort Pain Intervention(s): Monitored during session, Repositioned    Home Living                          Prior Function            PT Goals (current goals can now be found in the care plan section) Acute Rehab PT Goals Patient Stated Goal: Get well, be with her dog PT Goal Formulation: With patient Time For Goal Achievement: 05/14/23 Potential to Achieve Goals: Good Progress towards PT goals: Progressing toward goals    Frequency    Min 1X/week      PT Plan      Co-evaluation              AM-PAC PT 6 Clicks Mobility   Outcome Measure  Help needed turning from your back to your side while in a flat bed without using bedrails?: A Little Help needed moving from lying on your back to sitting on the side of a flat bed without using bedrails?: A Lot Help needed moving to and from a bed to a chair (including a wheelchair)?: A Little Help needed standing up from a chair using your arms (e.g., wheelchair or bedside chair)?: A Little Help needed to walk in hospital room?: A Little Help needed climbing 3-5 steps with a railing? : A Lot 6 Click Score: 16    End of Session Equipment Utilized During Treatment: Gait belt;Back brace Activity Tolerance: Patient tolerated  treatment well Patient left: in chair;with call bell/phone within reach;with chair alarm set Nurse Communication: Mobility status PT Visit Diagnosis: Unsteadiness on feet (R26.81);Other abnormalities of gait and mobility (R26.89);Muscle weakness (generalized) (M62.81);History of falling (Z91.81);Pain Pain - part of body:  (back)     Time: 8472-8448 PT Time Calculation (min) (ACUTE ONLY): 24 min  Charges:    $Gait Training: 8-22 mins $Therapeutic Activity: 8-22 mins PT General Charges $$ ACUTE PT VISIT: 1 Visit                     Leontine Roads, PT, DPT [facility]  Rehabilitation Services Physical Therapist Office: [phone] Website: [email or web address]    Leontine GORMAN Roads 05/02/2023, 4:26 PM

## 2023-05-02 NOTE — TOC Progression Note (Addendum)
 Transition of Care [facility]) - Progression Note    Patient Details  Name: Laura Fritz MRN: [id number] Date of Birth: 05/19/1940  [facility]) CM/SW Contact  Leotha Voeltz A Melessia Kaus, [place] Phone Number: 05/02/2023, 11:54 AM  Clinical Narrative:     Pt and son, Alm, selected [facility] for SNF. Possible bed available tomorrow, Levon will get back with CSW tomorrow to confirm. Admit over weekend may not be possible due to pending inclement weather. Estimated DC tomorrow if pt medically stable for DC.    TOC will continue to follow.         Expected Discharge Plan and Services                                               Social Determinants of Health (SDOH) Interventions SDOH Screenings   Food Insecurity: No Food Insecurity (04/29/2023)  Housing: Low Risk  (04/29/2023)  Transportation Needs: No Transportation Needs (04/29/2023)  Utilities: Not At Risk (04/29/2023)  Financial Resource Strain: Low Risk  (04/01/2023)   Received from [facility]  Recent Concern: Financial Resource Strain - Medium Risk (01/23/2023)   Received from [facility]  Physical Activity: Insufficiently Active (04/01/2023)   Received from [facility]  Social Connections: Moderately Isolated (04/29/2023)  Stress: Stress Concern Present (04/01/2023)   Received from [facility]  Tobacco Use: Medium Risk (04/29/2023)    Readmission Risk Interventions     No data to display

## 2023-05-02 NOTE — Plan of Care (Signed)

## 2023-05-02 NOTE — Care Management Important Message (Signed)
 Important Message  Patient Details  Name: Laura Fritz MRN: [id number] Date of Birth: 21-May-1940   Important Message Given:  Yes - Medicare IM     [name] 05/02/2023, 3:22 PM

## 2023-05-02 NOTE — Progress Notes (Signed)
 HD#3 SUBJECTIVE:  Patient Summary: Laura Fritz is a 83 y.o. female with a pertinent PMH of parkinson's disease, depression, fibromyalgia, and anxiety who presented after two falls on plavix  and is admitted for syncope workup .   Overnight Events: None  Interim History: Patient evaluated at bedside. She is doing well this morning and has no new concerns.   OBJECTIVE:  Vital Signs: Vitals:   05/01/23 1651 05/01/23 2032 05/02/23 0457 05/02/23 0737  BP: 111/62 131/66 138/65 (!) 119/56  Pulse: 74 72 70 64  Resp:  18 18 18   Temp: 99.2 F (37.3 C) 99 F (37.2 C) 98.6 F (37 C) 98 F (36.7 C)  TempSrc: Oral Oral Oral   SpO2: 91% 91% 93% 90%  Weight:      Height:       Supplemental O2: Room air  Filed Weights   04/28/23 0955  Weight: 68 kg   Physical Exam: General: well-appearing, in no acute distress Cardiac: regular rate and rhythm, no murmurs; remains euvolemic Pulmonary: CTA bilaterally, normal effort and rate Neuro: Alert and oriented x 3. Straight leg raise negative bilaterally. Strength in tact and equal in bilateral lower extremities. No sensory changes or numbness.  Psych: Normal mood and affect  Pertinent Labs:    Latest Ref Rng & Units 04/29/2023    6:14 AM 04/28/2023    9:59 AM 04/28/2023    9:51 AM  CBC  WBC 4.0 - 10.5 K/uL 7.2   8.8   Hemoglobin 12.0 - 15.0 g/dL 86.6  84.9  86.1   Hematocrit 36.0 - 46.0 % 40.9  44.0  42.5   Platelets 150 - 400 K/uL 149   165       Latest Ref Rng & Units 04/29/2023    6:14 AM 04/28/2023    9:59 AM 04/28/2023    9:51 AM  CMP  Glucose 70 - 99 mg/dL 81  98  897   BUN 8 - 23 mg/dL 16  19  17    Creatinine 0.44 - 1.00 mg/dL 9.00  8.89  8.89   Sodium 135 - 145 mmol/L 140  142  139   Potassium 3.5 - 5.1 mmol/L 4.2  3.6  3.6   Chloride 98 - 111 mmol/L 107  105  104   CO2 22 - 32 mmol/L 24   23   Calcium  8.9 - 10.3 mg/dL 8.9   9.2   Total Protein 6.5 - 8.1 g/dL   6.1   Total Bilirubin 0.0 - 1.2 mg/dL   1.0   Alkaline Phos 38 - 126  U/L   65   AST 15 - 41 U/L   31   ALT 0 - 44 U/L   24     ASSESSMENT/PLAN:  Assessment: Principal Problem:   Postural dizziness with near syncope Active Problems:   Fall   Closed compression fracture of L3 lumbar vertebra, initial encounter (HCC)   ASCVD (arteriosclerotic cardiovascular disease)   Sleep trouble   Hypothyroid   Hypoxic respiratory failure (HCC)   Parkinson disease (HCC)   Syncope  Laura Fritz is a 83 y.o. person living with a history of parkinson's disease, depression, fibromyalgia, and anxiety who presented after two falls on plavix  and is admitted for syncope workup on hospital day 4.  Plan: Recurrent falls Acute compression fracture of L3 She is medically stable for discharge to a rehab facility. Her pain remains well-controlled with topical analgesics, tylenol  and oxycodone  5 mg q4h prn. She does report some  leg/back pain while working with PT, however we did not appreciate any weakness or radicular symptoms on exam this morning. We will continue to monitor this, but expect it to improve with continued PT/OT. I spoke with her son, Alm, yesterday and updated him about her condition, and he is very proactive and committed to helping her with the transition to rehab and from rehab to a safe living situation.   Postural dizziness with near syncope Parkinson's disease Falls are likely secondary to orthostasis, which may be related to dehydration or progression of parkinson's. We will continue with compression stockings and liberalize salt and fluid intake outpatient. No new concerns.   Chronic conditions: Hypothyroidism: TSH was 0.112. Levothyroxine  decreased to 25 mcg daily. Follow up in 3 months.  Chronic diarrhea: Well-controlled on loperamide . Depression: On lexapro  10 mg daily and bupropion  300 mg daily, continued.  Coronary artery disease: On rosuvastatin  20 mg daily, plavix  75 mg daily, continued. GERD: On omeprazole 20 mg daily, continued.    Best  Practice: Diet: normal IVF: Fluids: None VTE: Place TED hose Start: 04/30/23 1045 rivaroxaban  (XARELTO ) tablet 10 mg Start: 04/28/23 1445 Code: DNR AB: None Therapy Recs: SNF Family Contact: son, to be notified. DISPO: Medically stable for discharge to SNF.   Signature: Ozell Riff, MD  Internal Medicine Resident, PGY-1 [facility]  Pager: [phone] 11:38 AM, 05/02/2023   Please contact the on call pager after 5 pm and on weekends at [phone].

## 2023-05-03 DIAGNOSIS — R55 Syncope and collapse: Secondary | ICD-10-CM | POA: Diagnosis not present

## 2023-05-03 DIAGNOSIS — R42 Dizziness and giddiness: Secondary | ICD-10-CM | POA: Diagnosis not present

## 2023-05-03 MED ORDER — LEVOTHYROXINE SODIUM 25 MCG PO TABS: 25.0000 ug | ORAL_TABLET | Freq: Every day | ORAL | Status: AC

## 2023-05-03 MED ORDER — VITAMIN D 25 MCG (1000 UNIT) PO TABS: 1000.0000 [IU] | ORAL_TABLET | Freq: Every day | ORAL | Status: AC

## 2023-05-03 NOTE — Progress Notes (Signed)
 Report called to [employer], all questions answered . Son Onalee Hua called and aware of pt leaving. Room number provided .

## 2023-05-03 NOTE — TOC Transition Note (Signed)
 Transition of Care [facility]) - Discharge Note   Patient Details  Name: Laura Fritz MRN: [id number] Date of Birth: 12/10/1940  Transition of Care [facility]) CM/SW Contact:  Keyon Liller A Nnamdi Dacus, [place] Phone Number: 05/03/2023, 11:47 AM   Clinical Narrative:     Patient will DC to: [facility]  Anticipated DC date: 05/03/23  Family notified: Alm Dance  Transport by: [place]      Per MD patient ready for DC to [facility]. RN, patient, patient's family, and facility notified of DC. Discharge Summary and FL2 sent to facility. RN to call report prior to discharge (room 105-p, [phone]. ). DC packet on chart. Ambulance transport requested for patient.     CSW will sign off for now as social work intervention is no longer needed. Please consult us  again if new needs arise.   Final next level of care: Skilled Nursing Facility Barriers to Discharge: Barriers Resolved   Patient Goals and CMS Choice            Discharge Placement              Patient chooses bed at: [facility] Patient to be transferred to facility by: [facility] Name of family member notified: Alm Dance Patient and family notified of of transfer: 05/03/23  Discharge Plan and Services Additional resources added to the After Visit Summary for                                       Social Drivers of Health (SDOH) Interventions SDOH Screenings   Food Insecurity: No Food Insecurity (04/29/2023)  Housing: Low Risk  (04/29/2023)  Transportation Needs: No Transportation Needs (04/29/2023)  Utilities: Not At Risk (04/29/2023)  Financial Resource Strain: Low Risk  (04/01/2023)   Received from [facility]  Recent Concern: Financial Resource Strain - Medium Risk (01/23/2023)   Received from [facility]  Physical Activity: Insufficiently Active (04/01/2023)   Received from [facility]  Social Connections: Moderately Isolated (04/29/2023)  Stress: Stress  Concern Present (04/01/2023)   Received from [facility]  Tobacco Use: Medium Risk (04/29/2023)     Readmission Risk Interventions     No data to display

## 2023-05-03 NOTE — Discharge Summary (Signed)
 Name: Laura Fritz MRN: [id number] DOB: 1941-02-07 83 y.o. PCP: Baird Comer Laura Fritz  Date of Admission: 04/28/2023  9:39 AM Date of Discharge: 05/03/2023 11:16 AM Attending Physician: Rosan Dayton BROCKS, DO  Discharge diagnosis: Principal Problem:   Postural dizziness with near syncope Active Problems:   Closed compression fracture of L3 lumbar vertebra, initial encounter [facility])   ASCVD (arteriosclerotic cardiovascular disease)   Sleep trouble   Hypothyroid   Parkinson disease (HCC)  Resolved Problems:   Fall   Hypoxic respiratory failure (HCC)   Syncope   Discharge medications: Allergies as of 05/03/2023       Reactions   Tramadol Anaphylaxis   Oral cavity tremors   Budesonide  Other (See Comments)   Levofloxacin Other (See Comments)   Tendonitis   Mirabegron Other (See Comments)   Mirabegron Er Other (See Comments)   Nsaids Rash   Sulfa Antibiotics Rash   Amoxicillin Other (See Comments), Nausea Only   Nystatin Other (See Comments)        Medication List     STOP taking these medications    lidocaine  5 % Commonly known as: Lidoderm    Vitamin D  (Ergocalciferol ) 1.25 MG (50000 UNIT) Caps capsule Commonly known as: DRISDOL        TAKE these medications    buPROPion  300 MG 24 hr tablet Commonly known as: WELLBUTRIN  XL Take 300 mg by mouth every morning.   cholecalciferol 25 MCG (1000 UNIT) tablet Commonly known as: VITAMIN D3 Take 1 tablet (1,000 Units total) by mouth daily.   clopidogrel  75 MG tablet Commonly known as: PLAVIX  Take 75 mg by mouth daily.   escitalopram  10 MG tablet Commonly known as: LEXAPRO  Take 10 mg by mouth daily.   gabapentin  300 MG capsule Commonly known as: NEURONTIN  Take 900 mg by mouth at bedtime.   levothyroxine  25 MCG tablet Commonly known as: SYNTHROID  Take 1 tablet (25 mcg total) by mouth daily at 6 (six) AM. What changed:  medication strength how much to take when to take this   loperamide  2 MG  capsule Commonly known as: IMODIUM  Take 2 mg by mouth 4 (four) times daily as needed for diarrhea or loose stools.   nitroGLYCERIN  0.4 MG SL tablet Commonly known as: NITROSTAT  Place 0.4 mg under the tongue every 5 (five) minutes as needed for chest pain.   omeprazole 20 MG capsule Commonly known as: PRILOSEC Take 20 mg by mouth in the morning.   rosuvastatin  20 MG tablet Commonly known as: CRESTOR  Take 20 mg by mouth at bedtime.   traZODone  50 MG tablet Commonly known as: DESYREL  Take 50 mg by mouth at bedtime as needed for sleep.   vitamin E 1000 UNIT capsule Take 1,000 Units by mouth daily.        Follow-up appointments:  Contact information for follow-up providers     [place], [place] Laura Fritz Follow up.   Specialty: Adult Health Nurse Practitioner Contact information: [address] [place] [address] [phone]              Contact information for after-discharge care     Destination     [facility] Preferred SNF .   Service: Skilled Nursing Contact information: [address] [place]   [address] [phone]                     Disposition and recommendations: Ms. Laura Fritz is a 83 y.o. year old admitted after a fall, complicated  by a L3 compression fracture.  She was treated for postural orthostatic hypotension with dizziness.  She was discharged in good condition to SNF for subacute rehab on hospital day 5.  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 - Compression stockings - Liberalize blood pressure targets - Liberalize diet to maintain adequate salt intake - Avoid sedating medicines, like benadryl , at bedtime  Parkinson's disease Dysautonomia with some tremor.  Not on medicine for this prior to hospitalization.  May consider starting something outside of the hospital if it helps with the dysautonomia.  L3 compression  fracture With some radicular signs and symptoms but no weakness.  Declined intervention like kyphoplasty for this and at any rate her injury is probably not amenable to surgical intervention.  Hypothyroidism TSH is quite low.  Levothyroxine  was decreased to 25 mcg daily.  Follow-up in 3 months.  Hospital course: 83 year old female presented after a fall at home, found to have a L3 vertebral compression fracture.  Principal Problem:   Postural dizziness with near syncope Due to Parkinson's disease, may be some component of polypharmacy.  Recommended stopping nightly antihistamines.  Monitored with cardiac telemetry, there were no signs of arrhythmia.  Do not think that this was cardiac or vasovagal syncope.  Active Problems:   Closed compression fracture of L3 lumbar vertebra, initial encounter (HCC) Without weakness, do not think this is amenable to kyphoplasty, and at any rate this person declined operative intervention.  I anticipate that her radicular pain will improve with time and physical therapy.    ASCVD (arteriosclerotic cardiovascular disease) Clopidogrel  continued for admission.    Sleep trouble Nightly antihistamines were discontinued on admission.  Nightly trazodone  was continued.    Hypothyroid TSH was 0.112.  Levothyroxine  was decreased from 50 to 25 mcg daily.    Parkinson disease (HCC) Has upper extremity pill-rolling tremors and dysautonomia.  Not on any antiparkinson agents.  Resolved Problems:   Fall Complicated by L3 compression fracture.    Hypoxic respiratory failure (HCC) From splinting due to pain.  This improved with good pulmonary toileting including incentive spirometry.    Syncope Prehospitalization, she got up from bed to feed dog and get midnight snack, per usual.  When she was standing at the counter she became dizzy.  Her orthostatics remain positive despite euvolemia, presumably from her parkinsonian dysautonomia.  Ruled out arrhythmia, do not  think this is a structural heart problem, doubt vasovagal.  Discharge exam: General: well-appearing, sitting in bed comfortably, on room air Cardiac: regular rate and rhythm; appears euvolemic on exam Pulmonary: normal rate and effort, lungs clear Skin: no new skin changes Abdomen: no abdominal symptoms MSK: mild lumbar back pain, unchanged from yesterday Neuro: Alert and oriented. No sensory deficits. Negative straight leg raise bilaterally. Strength in tact and equal in bilateral lower extremities. Resting tremor, more predominant on the left upper extremity.    Blood pressure 110/63, pulse 60, temperature 98 F (36.7 C), resp. rate 18, height 5' 3 (1.6 m), weight 68 kg, SpO2 93%.  Pertinent studies and procedures: No results found for this or any previous visit (from the past 56199 hours). Imaging Orders         DG Chest Port 1 View         DG Pelvis Portable         CT HEAD WO CONTRAST         CT CERVICAL SPINE WO CONTRAST         CT Thoracic  Spine Wo Contrast         CT Lumbar Spine Wo Contrast         VAS US  CAROTID    Lab Orders         Comprehensive metabolic panel         CBC         Lipase, blood         VITAMIN D  25 Hydroxy (Vit-D Deficiency, Fractures)         TSH         Basic metabolic panel         CBC         Urinalysis, Routine w reflex microscopic -Urine, Clean Catch         I-Stat Chem 8, ED     Discharge Instructions:   Discharge Instructions      To Ms. Almetta Pringle or their caretakers,  They were admitted to [facility] on 04/28/2023 for evaluation and treatment of:  Principal Problem:   Postural dizziness with near syncope Active Problems:   Closed compression fracture of L3 lumbar vertebra, initial encounter [facility])   ASCVD (arteriosclerotic cardiovascular disease)   Sleep trouble   Hypothyroid   Parkinson disease (HCC)  Resolved Problems:   Fall   Hypoxic respiratory failure (HCC)   Syncope  The evaluation suggested a  fall due to low blood pressure when standing, common complication of Parkinson's disease..  There was also a compression fracture of the L3 lumbar vertebra. They were treated cardiac monitoring, supportive care, physical therapy, and Occupational Therapy.  They were discharged from the hospital on 05/03/23. I recommend the following after leaving the hospital:   Avoid common over-the-counter medicines that can cause low blood pressure when standing, especially antihistamines like Benadryl , hydroxyzine , doxylamine.  Avoid sedating medicines before bedtime unless absolutely necessary.  Compression stockings are helpful for low blood pressure while standing.  It is okay to eat what you like, even if it has some extra salt.  A little bit of extra salt may help keep your blood pressure normal when you are standing up.  Stop taking the high-dose weekly vitamin D  supplement.  Start taking a low-dose daily vitamin D  supplement.  Based on your test results, we decreased your levothyroxine  dose.  Start taking a 25 mcg tablet once a day.  For questions about your care plan, until you are able to see your primary doctor: Call [phone]. Dial 0 for the operator. Ask for the internal medicine resident on call.  Ozell Riff MD 05/03/2023, 10:54 AM

## 2023-05-03 NOTE — Discharge Instructions (Addendum)
 To Ms. Dazha Kempa or their caretakers,  They were admitted to [facility] on 04/28/2023 for evaluation and treatment of:  Principal Problem:   Postural dizziness with near syncope Active Problems:   Closed compression fracture of L3 lumbar vertebra, initial encounter [facility])   ASCVD (arteriosclerotic cardiovascular disease)   Sleep trouble   Hypothyroid   Parkinson disease (HCC)  Resolved Problems:   Fall   Hypoxic respiratory failure (HCC)   Syncope  The evaluation suggested a fall due to low blood pressure when standing, common complication of Parkinson's disease..  There was also a compression fracture of the L3 lumbar vertebra. They were treated cardiac monitoring, supportive care, physical therapy, and Occupational Therapy.  They were discharged from the hospital on 05/03/23. I recommend the following after leaving the hospital:   Avoid common over-the-counter medicines that can cause low blood pressure when standing, especially antihistamines like Benadryl , hydroxyzine , doxylamine.  Avoid sedating medicines before bedtime unless absolutely necessary.  Compression stockings are helpful for low blood pressure while standing.  It is okay to eat what you like, even if it has some extra salt.  A little bit of extra salt may help keep your blood pressure normal when you are standing up.  Stop taking the high-dose weekly vitamin D  supplement.  Start taking a low-dose daily vitamin D  supplement.  Based on your test results, we decreased your levothyroxine  dose.  Start taking a 25 mcg tablet once a day.  For questions about your care plan, until you are able to see your primary doctor: Call [phone]. Dial 0 for the operator. Ask for the internal medicine resident on call.  Ozell Riff MD 05/03/2023, 10:54 AM

## 2023-05-03 NOTE — Progress Notes (Signed)
 [facility], picked up pt to [facility] ,  all personal belongings in hand including brace.

## 2023-10-24 ENCOUNTER — Encounter (HOSPITAL_COMMUNITY): Payer: Self-pay | Admitting: *Deleted

## 2023-10-24 ENCOUNTER — Emergency Department (HOSPITAL_COMMUNITY)
Admission: EM | Admit: 2023-10-24 | Discharge: 2023-10-24 | Disposition: A | Discharge status: Home / Self Care | Attending: Emergency Medicine | Admitting: Emergency Medicine

## 2023-10-24 ENCOUNTER — Other Ambulatory Visit: Payer: Self-pay

## 2023-10-24 ENCOUNTER — Emergency Department (HOSPITAL_COMMUNITY)

## 2023-10-24 DIAGNOSIS — R079 Chest pain, unspecified: Secondary | ICD-10-CM | POA: Diagnosis present

## 2023-10-24 DIAGNOSIS — I2510 Atherosclerotic heart disease of native coronary artery without angina pectoris: Secondary | ICD-10-CM | POA: Insufficient documentation

## 2023-10-24 DIAGNOSIS — G20C Parkinsonism, unspecified: Secondary | ICD-10-CM | POA: Insufficient documentation

## 2023-10-24 DIAGNOSIS — E039 Hypothyroidism, unspecified: Secondary | ICD-10-CM | POA: Insufficient documentation

## 2023-10-24 DIAGNOSIS — Z87891 Personal history of nicotine dependence: Secondary | ICD-10-CM | POA: Insufficient documentation

## 2023-10-24 DIAGNOSIS — J189 Pneumonia, unspecified organism: Secondary | ICD-10-CM | POA: Insufficient documentation

## 2023-10-24 LAB — CBC WITH DIFFERENTIAL/PLATELET
Abs Immature Granulocytes: 0.01 10*3/uL (ref 0.00–0.07)
Basophils Absolute: 0.1 10*3/uL (ref 0.0–0.1)
Basophils Relative: 1 %
Eosinophils Absolute: 0.2 10*3/uL (ref 0.0–0.5)
Eosinophils Relative: 4 %
HCT: 40.2 % (ref 36.0–46.0)
Hemoglobin: 13.3 g/dL (ref 12.0–15.0)
Immature Granulocytes: 0 %
Lymphocytes Relative: 38 %
Lymphs Abs: 1.9 10*3/uL (ref 0.7–4.0)
MCH: 33.3 pg (ref 26.0–34.0)
MCHC: 33.1 g/dL (ref 30.0–36.0)
MCV: 100.8 fL — ABNORMAL HIGH (ref 80.0–100.0)
Monocytes Absolute: 0.3 10*3/uL (ref 0.1–1.0)
Monocytes Relative: 6 %
Neutro Abs: 2.6 10*3/uL (ref 1.7–7.7)
Neutrophils Relative %: 51 %
Platelets: 190 10*3/uL (ref 150–400)
RBC: 3.99 MIL/uL (ref 3.87–5.11)
RDW: 13.9 % (ref 11.5–15.5)
WBC: 5.1 10*3/uL (ref 4.0–10.5)
nRBC: 0 % (ref 0.0–0.2)

## 2023-10-24 LAB — COMPREHENSIVE METABOLIC PANEL WITH GFR
ALT: 39 U/L (ref 0–44)
AST: 43 U/L — ABNORMAL HIGH (ref 15–41)
Albumin: 3.4 g/dL — ABNORMAL LOW (ref 3.5–5.0)
Alkaline Phosphatase: 67 U/L (ref 38–126)
Anion gap: 8 (ref 5–15)
BUN: 11 mg/dL (ref 8–23)
CO2: 24 mmol/L (ref 22–32)
Calcium: 8.8 mg/dL — ABNORMAL LOW (ref 8.9–10.3)
Chloride: 108 mmol/L (ref 98–111)
Creatinine, Ser: 1.07 mg/dL — ABNORMAL HIGH (ref 0.44–1.00)
GFR, Estimated: 53 mL/min — ABNORMAL LOW (ref >60)
Glucose, Bld: 106 mg/dL — ABNORMAL HIGH (ref 70–99)
Potassium: 3.7 mmol/L (ref 3.5–5.1)
Sodium: 140 mmol/L (ref 135–145)
Total Bilirubin: 0.5 mg/dL (ref 0.0–1.2)
Total Protein: 5.9 g/dL — ABNORMAL LOW (ref 6.5–8.1)

## 2023-10-24 LAB — RESP PANEL BY RT-PCR (RSV, FLU A&B, COVID)  RVPGX2
Influenza A by PCR: NEGATIVE
Influenza B by PCR: NEGATIVE
Resp Syncytial Virus by PCR: NEGATIVE
SARS Coronavirus 2 by RT PCR: NEGATIVE

## 2023-10-24 LAB — TROPONIN I (HIGH SENSITIVITY): Troponin I (High Sensitivity): 5 ng/L (ref <18)

## 2023-10-24 MED ORDER — DOXYCYCLINE HYCLATE 100 MG PO CAPS: 100.0000 mg | ORAL_CAPSULE | Freq: Two times a day (BID) | ORAL | 0 refills | Status: DC

## 2023-10-24 MED ORDER — ACETAMINOPHEN 500 MG PO TABS
1000.0000 mg | ORAL_TABLET | Freq: Once | ORAL | Status: AC
  Administered 2023-10-24: 1000 mg via ORAL
  Filled 2023-10-24: qty 2

## 2023-10-24 MED ORDER — CEFDINIR 300 MG PO CAPS: 300.0000 mg | ORAL_CAPSULE | Freq: Two times a day (BID) | ORAL | 0 refills | Status: AC

## 2023-10-24 NOTE — ED Provider Notes (Signed)
 [place] EMERGENCY DEPARTMENT AT [facility] Provider Note  CSN: [id number] Arrival date & time: 10/24/23 1230  Chief Complaint(s) Headache and Chest Pain  HPI Laura Fritz is a 83 y.o. female history of Parkinson's disease, coronary artery disease, hyperlipidemia presenting to the emergency department with generalized weakness.  Patient reports generalized weakness for the last few days, also reports wheezing, mild cough, left-sided chest wall pain.  Reports symptoms are mild.  Has been going on for 2 to 3 days.  Referred to the ER for further workup including chest x-ray.  Denies shortness of breath, nausea or vomiting.  Reports mild headache with coughing.  No abdominal pain, diarrhea.  No urinary symptoms   Past Medical History 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HCC)    Parkinson disease (HCC)    Patient Active Problem List   Diagnosis Date Noted   Postural dizziness with near syncope 04/28/2023   Closed compression fracture of L3 lumbar vertebra, initial encounter (HCC) 04/28/2023   ASCVD (arteriosclerotic cardiovascular disease) 04/28/2023   Sleep trouble 04/28/2023   Hypothyroid 04/28/2023   Parkinson disease (HCC) 04/28/2023   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 Authorizing Provider  cefdinir (OMNICEF) 300 MG capsule Take 1 capsule (300 mg total) by mouth 2 (two) times daily for 7 days. 10/24/23 10/31/23 Yes  Francesca Elsie CROME, MD  doxycycline  (VIBRAMYCIN ) 100 MG capsule Take 1 capsule (100 mg total) by mouth 2 (two) times daily for 7 days. 10/24/23 10/31/23 Yes Francesca Elsie CROME, MD  buPROPion  (WELLBUTRIN  XL) 300 MG 24 hr tablet Take 300 mg by mouth every morning. 02/22/21   [provider]  cholecalciferol (VITAMIN D3) 25 MCG (1000 UNIT) tablet Take 1 tablet (1,000 Units total) by mouth daily. 05/03/23   Norrine Sharper, MD  clopidogrel  (PLAVIX ) 75 MG tablet Take 75 mg by mouth daily.    [provider]  escitalopram  (LEXAPRO ) 10 MG tablet Take 10 mg by mouth daily.    [provider]  gabapentin  (NEURONTIN ) 300 MG capsule Take 900 mg by mouth at bedtime.    [provider]  levothyroxine  (SYNTHROID ) 25 MCG tablet Take 1 tablet (25 mcg total) by mouth daily at 6 (six) AM. 05/03/23 08/01/23  Norrine Sharper, MD  loperamide  (IMODIUM ) 2 MG capsule Take 2 mg by mouth 4 (four) times daily as needed for diarrhea or loose stools. 03/21/21   [provider]  nitroGLYCERIN  (NITROSTAT ) 0.4 MG SL tablet Place 0.4 mg under the tongue every 5 (five) minutes as needed for chest pain.    [provider]  omeprazole (PRILOSEC) 20 MG capsule Take 20 mg by mouth in the morning.    [provider]  rosuvastatin  (CRESTOR ) 20 MG tablet Take 20 mg by mouth at bedtime. 01/12/22   [provider]  traZODone  (DESYREL ) 50 MG tablet Take 50 mg by mouth at bedtime as needed  for sleep. 08/31/20   [provider]  vitamin E 1000 UNIT capsule Take 1,000 Units by mouth daily.    [provider]                                                                                                                                    Past Surgical History Past Surgical History:  Procedure Laterality Date   ABDOMINAL HYSTERECTOMY     ANTERIOR (CYSTOCELE) AND POSTERIOR REPAIR (RECTOCELE) WITH XENFORM GRAFT AND SACROSPINOUS FIXATION     REVERSE SHOULDER  ARTHROPLASTY Right 03/31/2021   Procedure: REVERSE SHOULDER ARTHROPLASTY;  Surgeon: Cristy Bonner DASEN, MD;  Location: [facility] ORS;  Service: Orthopedics;  Laterality: Right;   TUBAL LIGATION     Family History Family History  Problem Relation Age of Onset   Lung cancer Mother    Esophageal cancer Maternal Grandfather    Esophageal cancer Cousin    Colon cancer Neg Hx    Pancreatic cancer Neg Hx    Stomach cancer Neg Hx    Liver disease Neg Hx     Social History Social History   Tobacco Use   Smoking status: Former   Smokeless tobacco: Never  Advertising account planner   Vaping status: Some Days  Substance Use Topics   Alcohol use: Not Currently   Drug use: No   Allergies Tramadol, Budesonide , Levofloxacin, Mirabegron, Mirabegron er, Nsaids, Sulfa antibiotics, Amoxicillin, and Nystatin  Review of Systems Review of Systems  All other systems reviewed and are negative.   Physical Exam Vital Signs  I have reviewed the triage vital signs BP 123/67   Pulse 63   Temp 97.7 F (36.5 C) (Oral)   Resp 16   SpO2 94%  Physical Exam Vitals and nursing note reviewed.  Constitutional:      General: She is not in acute distress.    Appearance: She is well-developed.  HENT:     Head: Normocephalic and atraumatic.     Mouth/Throat:     Mouth: Mucous membranes are moist.  Eyes:     Pupils: Pupils are equal, round, and reactive to light.  Cardiovascular:     Rate and Rhythm: Normal rate and regular rhythm.     Heart sounds: No murmur heard. Pulmonary:     Effort: Pulmonary effort is normal. No respiratory distress.     Breath sounds: Examination of the left-middle field reveals wheezing and rales. Examination of the left-lower field reveals wheezing and rales. Wheezing and rales present.  Abdominal:     General: Abdomen is flat.     Palpations: Abdomen is soft.     Tenderness: There is no abdominal tenderness.  Musculoskeletal:        General: No tenderness.     Right lower leg: No edema.      Left lower leg: No edema.  Skin:    General: Skin is warm and dry.  Neurological:  General: No focal deficit present.     Mental Status: She is alert and oriented to person, place, and time. Mental status is at baseline.     Cranial Nerves: No cranial nerve deficit.     Sensory: No sensory deficit.     Motor: No weakness.  Psychiatric:        Mood and Affect: Mood normal.        Behavior: Behavior normal.     ED Results and Treatments Labs (all labs ordered are listed, but only abnormal results are displayed) Labs Reviewed  CBC WITH DIFFERENTIAL/PLATELET - Abnormal; Notable for the following components:      Result Value   MCV 100.8 (*)    All other components within normal limits  COMPREHENSIVE METABOLIC PANEL WITH GFR - Abnormal; Notable for the following components:   Glucose, Bld 106 (*)    Creatinine, Ser 1.07 (*)    Calcium  8.8 (*)    Total Protein 5.9 (*)    Albumin 3.4 (*)    AST 43 (*)    GFR, Estimated 53 (*)    All other components within normal limits  RESP PANEL BY RT-PCR (RSV, FLU A&B, COVID)  RVPGX2  TROPONIN I (HIGH SENSITIVITY)                                                                                                                          Radiology DG Chest 2 View Result Date: 10/24/2023 CLINICAL DATA:  Chest pain EXAM: CHEST - 2 VIEW COMPARISON:  Chest radiograph April 28, 2023 FINDINGS: The heart size is borderline normal. Aortic knob is calcified. There is patchy areas of consolidation involving the left upper lobe and posterior aspects of left lower lobe, new to prior. Trace left-sided pleural effusion along the major fissure and blunting of left costophrenic angle. Status post reverse right shoulder arthroplasty. IMPRESSION: Suggestion of multifocal left lung consolidations (possibly infection/inflammation). Recommend chest CT for further assessment. Trace left pleural effusion. Electronically Signed   By: Megan  Zare M.D.   On: 10/24/2023 14:08     Pertinent labs & imaging results that were available during my care of the patient were reviewed by me and considered in my medical decision making (see MDM for details).  Medications Ordered in ED Medications  acetaminophen  (TYLENOL ) tablet 1,000 mg (1,000 mg Oral Given 10/24/23 1718)  Procedures Procedures  (including critical care time)  Medical Decision Making / ED Course   MDM:  83 year old presenting to the emergency department with generalized weakness, cough, chest pain.  Exam patient does have focal pulmonary findings in the left mid and lower lung fields.  Right lung is clear.  Suspect pneumonia.  Chest x-ray shows left-sided infiltrate which correlates with physical examination findings.  She is having cough, chest pain with cough.  Troponin was checked and is negative with more than 2 hours of symptoms.  Radiology recommended obtaining CT chest with contrast for further evaluation, offered to perform this in ER but patient refused and wants to go home, since she clinically has findings of pneumonia and x-ray shows this as well I think this is reasonable but recommended that she obtain this to her primary doctor as an outpatient, she reports that she will.  Labs overall reassuring without leukocytosis.  Patient afebrile in the ER without tachycardia, tachypnea or signs of respiratory distress.  Blood pressure is stable.  Feel patient is stable for discharge.  Low concern for other causes of chest pain such as ACS, dissection, pulmonary embolism, pneumothorax.  Patient not septic appearing.  Patient also with mild headache, patient extremely well-appearing, neurologic exam reassuring, very low concern for any intracranial process.  Likely related to underlying illness.  Discussed return precautions.  Will discharge patient to home. All questions  answered. Patient comfortable with plan of discharge. Return precautions discussed with patient and specified on the after visit summary.       Additional history obtained: -Additional history obtained from caregiver -External records from outside source obtained and reviewed including: Chart review including previous notes, labs, imaging, consultation notes including prior notes    Lab Tests: -I ordered, reviewed, and interpreted labs.   The pertinent results include:   Labs Reviewed  CBC WITH DIFFERENTIAL/PLATELET - Abnormal; Notable for the following components:      Result Value   MCV 100.8 (*)    All other components within normal limits  COMPREHENSIVE METABOLIC PANEL WITH GFR - Abnormal; Notable for the following components:   Glucose, Bld 106 (*)    Creatinine, Ser 1.07 (*)    Calcium  8.8 (*)    Total Protein 5.9 (*)    Albumin 3.4 (*)    AST 43 (*)    GFR, Estimated 53 (*)    All other components within normal limits  RESP PANEL BY RT-PCR (RSV, FLU A&B, COVID)  RVPGX2  TROPONIN I (HIGH SENSITIVITY)    Notable for stable kidney function   EKG   EKG Interpretation Date/Time:    Ventricular Rate:    PR Interval:    QRS Duration:    QT Interval:    QTC Calculation:   R Axis:      Text Interpretation:           Imaging Studies ordered: I ordered imaging studies including CXR On my interpretation imaging demonstrates left sided infiltrate  I independently visualized and interpreted imaging. I agree with the radiologist interpretation   Medicines ordered and prescription drug management: Meds ordered this encounter  Medications   acetaminophen  (TYLENOL ) tablet 1,000 mg   cefdinir (OMNICEF) 300 MG capsule    Sig: Take 1 capsule (300 mg total) by mouth 2 (two) times daily for 7 days.    Dispense:  14 capsule    Refill:  0   doxycycline  (VIBRAMYCIN ) 100 MG capsule    Sig: Take 1 capsule (100 mg total)  by mouth 2 (two) times daily for 7 days.     Dispense:  14 capsule    Refill:  0    -I have reviewed the patients home medicines and have made adjustments as needed   Cardiac Monitoring: The patient was maintained on a cardiac monitor.  I personally viewed and interpreted the cardiac monitored which showed an underlying rhythm of: NSR  Co morbidities that complicate the patient evaluation  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HCC)    Parkinson disease (HCC)       Dispostion: Disposition decision including need for hospitalization was considered, and patient discharged from emergency department.    Final Clinical Impression(s) / ED Diagnoses Final diagnoses:  Community acquired pneumonia of left lung, unspecified part of lung     This chart was dictated using voice recognition software.  Despite best efforts to proofread,  errors can occur which can change the documentation meaning.    Francesca Elsie CROME, MD 10/24/23 [phone]

## 2023-10-24 NOTE — ED Provider Triage Note (Signed)
 Emergency Medicine Provider Triage Evaluation Note  Laura Fritz , a 83 y.o. female  was evaluated in triage.  Pt complains of chest pain.  Patient reports that she woke up this morning with worsening left-sided chest wall pain that worsens with coughing.  States his cough has been present for uncertain 1 time was advised by primary care prior to come to the emergency department for rule out of pneumonia.  Endorsing some headaches but denies any fevers, chills, and endorses chronic body aches.  Review of Systems  Positive: As above Negative: As above  Physical Exam  BP 125/78   Pulse 85   Temp 97.7 F (36.5 C) (Oral)   Resp 16   SpO2 97%  Gen:   Awake, no distress   Resp:  Normal effort  MSK:   Moves extremities without difficulty  Other:    Medical Decision Making  Medically screening exam initiated at 12:54 PM.  Appropriate orders placed.  Milika Ventress was informed that the remainder of the evaluation will be completed by another provider, this initial triage assessment does not replace that evaluation, and the importance of remaining in the ED until their evaluation is complete.     Brandell Maready A, PA-C 10/24/23 1255

## 2023-10-24 NOTE — ED Notes (Signed)
 Pt refused 2nd trop said she's leaving ariel z.pa aware at

## 2023-10-24 NOTE — ED Triage Notes (Addendum)
 PT arrives via POV. States she was told by her PCP to come to the ED to get an xray to rule out pneumonia. PT reports left chest wall discomfort, cough, and a headache. VSS.

## 2023-10-24 NOTE — ED Triage Notes (Addendum)
 Pt is here for further evaluation due to chest wall pain, she states that PCP advised for her to come r/o pneumonia

## 2023-10-24 NOTE — Discharge Instructions (Addendum)
 We evaluated you for your weakness, wheezing and chest pain.  Your testing in the emergency department was reassuring, although your chest x-ray did show signs of a pneumonia on the left side.  Your lung exam also had findings of a pneumonia.  We have prescribed you 2 different antibiotics.  Please take these both twice daily for 7 days.  Please follow-up closely with your primary doctor.  Your x-ray report from the radiologist suggested obtaining a CT scan of your chest.  Since you did not want to have this done today, we think it is reasonable to go home however please follow-up closely with your primary doctor to have this scheduled as an outpatient.  Please return for any new or worsening symptoms such as severe headaches, difficulty breathing, severe vomiting, lightheadedness, severe weakness, severe chest pain, or any other concerning symptoms

## 2023-10-27 ENCOUNTER — Other Ambulatory Visit: Payer: Self-pay

## 2023-10-27 ENCOUNTER — Emergency Department (HOSPITAL_COMMUNITY)
Admission: EM | Admit: 2023-10-27 | Discharge: 2023-10-28 | Disposition: A | Discharge status: Home / Self Care | Attending: Emergency Medicine | Admitting: Emergency Medicine

## 2023-10-27 ENCOUNTER — Encounter (HOSPITAL_COMMUNITY): Payer: Self-pay | Admitting: Emergency Medicine

## 2023-10-27 ENCOUNTER — Emergency Department (HOSPITAL_COMMUNITY)

## 2023-10-27 DIAGNOSIS — C3412 Malignant neoplasm of upper lobe, left bronchus or lung: Secondary | ICD-10-CM | POA: Insufficient documentation

## 2023-10-27 DIAGNOSIS — I2510 Atherosclerotic heart disease of native coronary artery without angina pectoris: Secondary | ICD-10-CM | POA: Insufficient documentation

## 2023-10-27 DIAGNOSIS — G20C Parkinsonism, unspecified: Secondary | ICD-10-CM | POA: Insufficient documentation

## 2023-10-27 DIAGNOSIS — Z79899 Other long term (current) drug therapy: Secondary | ICD-10-CM | POA: Insufficient documentation

## 2023-10-27 DIAGNOSIS — R0602 Shortness of breath: Secondary | ICD-10-CM | POA: Diagnosis present

## 2023-10-27 DIAGNOSIS — Z7902 Long term (current) use of antithrombotics/antiplatelets: Secondary | ICD-10-CM | POA: Diagnosis not present

## 2023-10-27 DIAGNOSIS — E039 Hypothyroidism, unspecified: Secondary | ICD-10-CM | POA: Insufficient documentation

## 2023-10-27 DIAGNOSIS — R9431 Abnormal electrocardiogram [ECG] [EKG]: Secondary | ICD-10-CM

## 2023-10-27 LAB — BASIC METABOLIC PANEL WITH GFR
Anion gap: 10 (ref 5–15)
BUN: 15 mg/dL (ref 8–23)
CO2: 19 mmol/L — ABNORMAL LOW (ref 22–32)
Calcium: 8.7 mg/dL — ABNORMAL LOW (ref 8.9–10.3)
Chloride: 108 mmol/L (ref 98–111)
Creatinine, Ser: 1.18 mg/dL — ABNORMAL HIGH (ref 0.44–1.00)
GFR, Estimated: 47 mL/min — ABNORMAL LOW (ref >60)
Glucose, Bld: 109 mg/dL — ABNORMAL HIGH (ref 70–99)
Potassium: 3.3 mmol/L — ABNORMAL LOW (ref 3.5–5.1)
Sodium: 137 mmol/L (ref 135–145)

## 2023-10-27 LAB — CBC
HCT: 39.3 % (ref 36.0–46.0)
Hemoglobin: 13.8 g/dL (ref 12.0–15.0)
MCH: 34.2 pg — ABNORMAL HIGH (ref 26.0–34.0)
MCHC: 35.1 g/dL (ref 30.0–36.0)
MCV: 97.5 fL (ref 80.0–100.0)
Platelets: 178 K/uL (ref 150–400)
RBC: 4.03 MIL/uL (ref 3.87–5.11)
RDW: 14 % (ref 11.5–15.5)
WBC: 5.6 K/uL (ref 4.0–10.5)
nRBC: 0 % (ref 0.0–0.2)

## 2023-10-27 LAB — MAGNESIUM: Magnesium: 1.5 mg/dL — ABNORMAL LOW (ref 1.7–2.4)

## 2023-10-27 MED ORDER — MAGNESIUM SULFATE 2 GM/50ML IV SOLN
2.0000 g | Freq: Once | INTRAVENOUS | Status: AC
  Administered 2023-10-27: 2 g via INTRAVENOUS
  Filled 2023-10-27: qty 50

## 2023-10-27 MED ORDER — PREDNISONE 50 MG PO TABS: 50.0000 mg | ORAL_TABLET | Freq: Every day | ORAL | 0 refills | Status: AC

## 2023-10-27 MED ORDER — DOXYCYCLINE HYCLATE 100 MG PO CAPS: 100.0000 mg | ORAL_CAPSULE | Freq: Two times a day (BID) | ORAL | 0 refills | Status: AC

## 2023-10-27 MED ORDER — IOHEXOL 350 MG/ML SOLN
75.0000 mL | Freq: Once | INTRAVENOUS | Status: AC | PRN
  Administered 2023-10-27: 75 mL via INTRAVENOUS

## 2023-10-27 MED ORDER — LACTATED RINGERS IV BOLUS
500.0000 mL | Freq: Once | INTRAVENOUS | Status: AC
  Administered 2023-10-27: 500 mL via INTRAVENOUS

## 2023-10-27 NOTE — Discharge Instructions (Addendum)
 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  Talk to Dr. Lonn with oncology who believe that you can be followed up in outpatient setting.  However continue to monitor symptoms and if you begin to have any new or worsening symptoms before you are able to follow-up with oncology such as worsening shortness of breath, chest pain, confusion, painful urination or blood in stool or urine, persistent vomiting please return to the ED for further evaluation.  I am prescribing you the antibiotic which you will take for the next 5 days twice a day, take with food to help with the stomach upset.  However I am prescribing additional nausea medication for you to take if you begin to experience any sort of vomiting.  Also recommend that you continue take a steroid for the next 5 days to help with any inflammation as cause for the shortness of breath today.  Recommend you follow-up with PCP to reevaluate your heart as well as reevaluate your labs as you were noted to have low magnesium  today.

## 2023-10-27 NOTE — ED Provider Notes (Incomplete)
 Laura Fritz EMERGENCY DEPARTMENT AT [facility] Provider Note   CSN: [id number] Arrival date & time: 10/27/23  1513     Patient presents with: Pneumonia   Laura Fritz is a 83 y.o. female.    Pneumonia Associated symptoms include shortness of breath.  Patient is an 83 year old female seen in the ED today with complaints of increased shortness of breath, generalized weakness and intermittent headache having been previously diagnosed with pneumonia 3 days ago sent home on cefdinir  and doxycycline .  Reports that she took 3 doses of both antibiotics but had persistent nausea and vomiting after taking the third dose, causing her to have a severe headache at that time, headache is since improved.  Stopped taking medication 2 days ago.  Here today wishing to get the CT that she was post to get in the outpatient setting has not gotten at this point.  Patient refuses to do x-ray of chest at this time with having previous done 3 days ago.  Previous medical history of Parkinson's disease, CAD, hypothyroidism, GERD, anxiety.    Notes that she has been experiencing some urinary frequency.  Denies current headache, vision changes, fever, cough, congestion, chest pain, abdominal pain, current nausea, dysuria, hematuria, hematochezia, melena, lower leg edema.    Prior to Admission medications   Medication Sig Start Date End Date Taking? Authorizing Provider  buPROPion  (WELLBUTRIN  XL) 300 MG 24 hr tablet Take 300 mg by mouth every morning. 02/22/21   [provider]  cefdinir  (OMNICEF ) 300 MG capsule Take 1 capsule (300 mg total) by mouth 2 (two) times daily for 7 days. 10/24/23 10/31/23  Francesca Elsie CROME, MD  cholecalciferol (VITAMIN D3) 25 MCG (1000 UNIT) tablet Take 1 tablet (1,000 Units total) by mouth daily. 05/03/23   Norrine Sharper, MD  clopidogrel  (PLAVIX ) 75 MG tablet Take 75 mg by mouth daily.    [provider]  doxycycline  (VIBRAMYCIN ) 100 MG capsule Take 1  capsule (100 mg total) by mouth 2 (two) times daily for 7 days. 10/24/23 10/31/23  Francesca Elsie CROME, MD  escitalopram  (LEXAPRO ) 10 MG tablet Take 10 mg by mouth daily.    [provider]  gabapentin  (NEURONTIN ) 300 MG capsule Take 900 mg by mouth at bedtime.    [provider]  levothyroxine  (SYNTHROID ) 25 MCG tablet Take 1 tablet (25 mcg total) by mouth daily at 6 (six) AM. 05/03/23 08/01/23  Norrine Sharper, MD  loperamide  (IMODIUM ) 2 MG capsule Take 2 mg by mouth 4 (four) times daily as needed for diarrhea or loose stools. 03/21/21   [provider]  nitroGLYCERIN  (NITROSTAT ) 0.4 MG SL tablet Place 0.4 mg under the tongue every 5 (five) minutes as needed for chest pain.    [provider]  omeprazole (PRILOSEC) 20 MG capsule Take 20 mg by mouth in the morning.    [provider]  rosuvastatin  (CRESTOR ) 20 MG tablet Take 20 mg by mouth at bedtime. 01/12/22   [provider]  traZODone  (DESYREL ) 50 MG tablet Take 50 mg by mouth at bedtime as needed for sleep. 08/31/20   [provider]  vitamin E 1000 UNIT capsule Take 1,000 Units by mouth daily.    [provider]    Allergies: Tramadol, Budesonide , Levofloxacin, Mirabegron, Mirabegron er, Nsaids, Sulfa antibiotics, Amoxicillin, and Nystatin    Review of Systems  Respiratory:  Positive for shortness of breath.   Genitourinary:  Positive for frequency.  All other systems reviewed and are negative.   Updated  Vital Signs BP 127/80   Pulse 68   Temp 98.1 F (36.7 C) (Oral)   Resp 16   Ht 5' 3 (1.6 m)   Wt 67.6 kg   SpO2 94%   BMI 26.39 kg/m   Physical Exam Vitals and nursing note reviewed.  Constitutional:      General: She is not in acute distress.    Appearance: Normal appearance. She is not ill-appearing or diaphoretic.  HENT:     Head: Normocephalic and atraumatic.     Nose: No congestion.     Mouth/Throat:     Mouth: Mucous membranes are moist.      Pharynx: Oropharynx is clear.  Eyes:     General:        Right eye: No discharge.        Left eye: No discharge.     Extraocular Movements: Extraocular movements intact.     Conjunctiva/sclera: Conjunctivae normal.     Pupils: Pupils are equal, round, and reactive to light.  Neck:     Comments: No meningeal signs Cardiovascular:     Rate and Rhythm: Normal rate and regular rhythm.     Pulses: Normal pulses.     Heart sounds: Normal heart sounds. No murmur heard.    No friction rub. No gallop.  Pulmonary:     Effort: Pulmonary effort is normal. No respiratory distress.     Breath sounds: No stridor. Rales (Rales present to left lower lobe.) present. No wheezing or rhonchi.  Abdominal:     General: Abdomen is flat. There is no distension.     Palpations: Abdomen is soft.     Tenderness: There is no abdominal tenderness. There is no right CVA tenderness, left CVA tenderness or guarding.  Musculoskeletal:        General: No tenderness.     Cervical back: Normal range of motion and neck supple. No rigidity or tenderness.     Right lower leg: No edema.     Left lower leg: No edema.  Skin:    General: Skin is warm and dry.     Findings: No bruising, erythema or lesion.  Neurological:     General: No focal deficit present.     Mental Status: She is alert and oriented to person, place, and time. Mental status is at baseline.     Cranial Nerves: No cranial nerve deficit.     Sensory: No sensory deficit.     Motor: No weakness.     Coordination: Coordination normal.     Comments: Parkinsonian tremor noted to left hand. No facial asymmetry, no ataxia, no apraxia, no aphasia, no arm drift, normal coordination with finger-to-nose, normal sensation to both upper and lower extremities bilaterally, normal grip strength bilaterally, normal strength to both flexion and extension to both upper lower extremities 5+ bilaterally, no visual field deficits, no nystagmus.   Psychiatric:        Mood  and Affect: Mood normal.     (all labs ordered are listed, but only abnormal results are displayed) Labs Reviewed  BASIC METABOLIC PANEL WITH GFR - Abnormal; Notable for the following components:      Result Value   Potassium 3.3 (*)    CO2 19 (*)    Glucose, Bld 109 (*)    Creatinine, Ser 1.18 (*)    Calcium  8.7 (*)    GFR, Estimated 47 (*)    All other components within normal limits  CBC - Abnormal; Notable for the  following components:   MCH 34.2 (*)    All other components within normal limits  MAGNESIUM  - Abnormal; Notable for the following components:   Magnesium  1.5 (*)    All other components within normal limits  URINALYSIS, ROUTINE W REFLEX MICROSCOPIC    EKG: None  Radiology: CT Chest W Contrast Result Date: 10/27/2023 CLINICAL DATA:  Pneumonia, complication suspected, xray done EXAM: CT CHEST WITH CONTRAST TECHNIQUE: Multidetector CT imaging of the chest was performed during intravenous contrast administration. RADIATION DOSE REDUCTION: This exam was performed according to the departmental dose-optimization program which includes automated exposure control, adjustment of the mA and/or kV according to patient size and/or use of iterative reconstruction technique. CONTRAST:  75mL OMNIPAQUE  IOHEXOL  350 MG/ML SOLN COMPARISON:  Chest x-ray 10/24/2023, CT chest 04/16/2021 FINDINGS: Cardiovascular: Normal heart size. No significant pericardial effusion. The thoracic aorta is normal in caliber. Severe atherosclerotic plaque of the thoracic aorta. Four-vessel coronary artery calcifications. The main pulmonary artery is normal in caliber. No pulmonary embolus. No narrowing of the encased pulmonary arteries. Mediastinum/Nodes: Conglomerative left hilar lymphadenopathy difficult to measure measuring up to 4.5 x 3 cm on axial imaging (3:61). Mediastinal lymphadenopathy as well as right hilar lymphadenopathy with as an example a 1 cm lymph node on the right (3:73) and a precarinal 1.7 cm  lymph node (3:60). No axillary lymphadenopathy. Thyroid gland, trachea, and esophagus demonstrate no significant findings. Lungs/Pleura: Moderate emphysematous changes. Left upper lobe posterior inferior masslike consolidation extending to the left hilar region and into the left lower lobe measuring up to 9.5 x 4.5 cm. Density encases the left pulmonary arteries as well as left pulmonary proximal bronchials that appear to be narrowed from external mass effect. Left lower lobe ground-glass patchy airspace opacities distal to the mass. Trace to small volume left pleural effusion with associated multiple enhancing nodular pleural densities (3:61). No pneumothorax. Upper Abdomen: Perigastric venous collaterals. Subcentimeter left hepatic lobe hypodensity (3:138). Musculoskeletal: No chest wall abnormality. No suspicious lytic or blastic osseous lesions. No cortical erosion or destruction-specifically of the ribs adjacent to the pleural thickening. No acute displaced fracture. L1 chronic anterior wedge compression fracture. Multilevel degenerative changes of the spine. Total right shoulder arthroplasty. IMPRESSION: 1. 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 Left upper lobe 9.5 x 4.5 cm left upper lobe posteroinferior masslike consolidation extending to the left hilar region and left lower lobe. This density in cases the left pulmonary arteries as well as the left pulmonary proximal bronchials are narrowed from external mass effect. Additional imaging evaluation or consultation with Pulmonology or Thoracic Surgery recommended. 2. Left lower lobe ground-glass patchy airspace opacities distal to the mass. Finding could represent direct invasion of the mass versus postobstructive consolidation. 3. No pulmonary embolus. No narrowing of the encased pulmonary arteries. 4.  Emphysema (ICD10-J43.9). 5. Aortic Atherosclerosis (ICD10-I70.0) -including four-vessel coronary calcification. 6.  Nonspecific perigastric venous collaterals. Electronically Signed   By: Morgane  Naveau M.D.   On: 10/27/2023 18:30     Procedures   Medications Ordered in the ED  magnesium  sulfate IVPB 2 g 50 mL (2 g Intravenous New Bag/Given 10/27/23 2212)  lactated ringers  bolus 500 mL (0 mLs Intravenous Stopped 10/27/23 1915)  iohexol  (OMNIPAQUE ) 350 MG/ML injection 75 mL (75 mLs Intravenous Contrast Given 10/27/23 1804)    Clinical Course as of 10/27/23 2307  Sun Oct 27, 2023  2020 Talked to [place]. Who recommended that the patient could follow up in the outpatient setting if she is okay to  wait 2-3 weeks for diagnosis and workup. Otherwise she would recommend admission for expedited workup due to her having 2 ED visits in the last 3 days. [CB]    Clinical Course User Index [CB] Beola Terrall RAMAN, PA-C                               Medical Decision Making Amount and/or Complexity of Data Reviewed Labs: ordered. Radiology: ordered. ECG/medicine tests: ordered.  Risk Prescription drug management.   This patient is a 83 year old female who presents to the ED for concern of continued shortness of breath with desiring to get CT scans done today after being recently diagnosed with pneumonia 3 days ago, took antibiotics for 3 doses but had persistent vomiting after taking antibiotics and stopped.  Notes that she has felt much better since stopping antibiotics and actually feels significantly improved from when she was seen earlier 3 days ago.  Currently not experiencing any pain.  On physical exam, patient is in no acute distress, afebrile, alert and orient x 4, speaking in full sentences, nontachypneic, nontachycardic.  Notably has left-sided rales and rhonchi.  Regular rate and rhythm, chronic heart murmur noted, no lower leg extremities noted.  Normal neuroexam and is otherwise unremarkable.  Low suspicion for ACS, PE with patient's recent diagnosis.  However repeat labs today and get CT imaging as she  was recommended to do 3 days ago.  CT scan showed malignancy in the left upper lobe of the lung.  Discussed with patient that I will speak to oncology and will look to admit with her intermittent shortness of breath.  However patient states that she is feeling significantly better than when she was seen earlier this week, will get ambulatory sats.  Labs did show a hypomagnesia and magnesium  was provided.  Labs also did note a mild elevated creatinine which she says is from dehydration.  Provided LR.  Will repeat ECG as well.  On reevaluation, patient did well with ambulatory sats noting to have an ambulatory saturation of 93-95%.  Spoke with Dr. Lonn with oncology who with the patient wishing to go home at this time recommended that if she is willing to wait 2 to 3 weeks for diagnosis and workup that she can go home.  Was concerned that with her repeat visit to the emergency department today that she would likely need admission however with her having ambulatory sats that are normal and with desires to go home, believe that she can follow-up in the outpatient setting.  But still recommended that she be admitted to the hospital.  Discussed with patient and agreed that she has strict return to ED precautions given and to return for any new or worsening symptoms.  Will send her home with prednisone  as well as continuing her doxycycline .  Due to her having eczema with shortness of breath, to treat any occult infection versus emphysema exacerbation.  Will have her follow-up with PCP for reevaluation of her hypomagnesia today.  Repeat ECG was done.  Patient vital signs have remained stable throughout the course of patient's time in the ED. Low suspicion for any other emergent pathology at this time. I believe this patient is safe to be discharged. Provided strict return to ER precautions. Patient expressed agreement and understanding of plan. All questions were answered.  Differential diagnoses prior to  evaluation: The emergent differential diagnosis includes, but is not limited to, pneumonia, metastasis, ACS, heart failure, pneumothorax,  metabolic disturbance, UTI, adverse drug reaction, sepsis, PE, CVA.  This is not an exhaustive differential.   Past Medical History / Co-morbidities / Social History: GERD, Graves' disease, CAD, hypothyroidism, anxiety, MI, Parkinson's disease, fibromyalgia, hepatitis, HLD.  Additional history: Chart reviewed. Pertinent results include:   Seen 3 days ago for CAP. Noted to have some wheezing, left-sided chest wall pain for the last 2-3 days.  X-ray at the time showed left side infiltrates.  Troponin was negative.  Initially recommended to do CT imaging with contrast for further evaluation and patient refused.  With findings of pneumonia, patient was discharged with outpatient follow-up.  Sent home on cefdinir  and doxycycline .  Lab Tests/Imaging studies: I personally interpreted labs/imaging and the pertinent results include: CBC unremarkable BMP does shows a mild hypokalemia 3.3 as well as a mild hypocalcemia of 8.7, does note an elevated creatinine 1.18 likely secondary to dehydration with patient Dors that she is not drinking much she needs to.   CT scan shows left lung malignancy with bilateral hilar and mediastinal lymphadenopathy, left upper lobe lesion of measuring 9.5 x 4.5 cm also noted to have left lobe ground glass patchy airspace opacities to distal mass.  Noting emphysema as well as nonspecific perigastric venous collaterals.  No PE.  I agree with the radiologist interpretation.  Cardiac monitoring: EKG obtained and interpreted by myself and attending physician which shows: Initial ECG showed QT prolongation with sinus rhythm.   Medications: I ordered medication including LR.  I have reviewed the patients home medicines and have made adjustments as needed.  Critical Interventions: None  Social Determinants of Health: Notably wants to go home  despite symptoms and wishes to be treated in the outpatient setting without wishing to be admitted to the hospital.  Disposition: After consideration of the diagnostic results and the patients response to treatment, I feel that the patient would benefit from    Final diagnoses:  Malignant neoplasm of upper lobe of left lung [facility])    ED Discharge Orders          Ordered    Ambulatory referral to Hematology / Oncology        10/27/23 2257               Beola Terrall RAMAN, [place] 10/27/23 2310

## 2023-10-27 NOTE — ED Provider Notes (Cosign Needed)
 [name] EMERGENCY DEPARTMENT AT [facility] Provider Note   CSN: [id number] Arrival date & time: 10/27/23  1513     Patient presents with: Pneumonia   Laura Fritz is a 83 y.o. female.    Pneumonia Associated symptoms include shortness of breath.  Patient is an 83 year old female seen in the ED today with complaints of increased shortness of breath, generalized weakness and intermittent headache having been previously diagnosed with pneumonia 3 days ago sent home on cefdinir  and doxycycline .  Reports that she took 3 doses of both antibiotics but had persistent nausea and vomiting after taking the third dose, causing her to have a severe headache at that time, headache is since improved.  Stopped taking medication 2 days ago.  Here today wishing to get the CT that she was post to get in the outpatient setting has not gotten at this point.  Patient refuses to do x-ray of chest at this time with having previous done 3 days ago.  Previous medical history of Parkinson's disease, CAD, hypothyroidism, GERD, anxiety.  Okay  Notes that she has been experiencing some urinary frequency.  Denies current headache, vision changes, fever, cough, congestion, chest pain, abdominal pain, current nausea, dysuria, hematuria, hematochezia, melena, lower leg edema.    Prior to Admission medications   Medication Sig Start Date End Date Taking? Authorizing Provider  buPROPion  (WELLBUTRIN  XL) 300 MG 24 hr tablet Take 300 mg by mouth every morning. 02/22/21   [provider]  cefdinir  (OMNICEF ) 300 MG capsule Take 1 capsule (300 mg total) by mouth 2 (two) times daily for 7 days. 10/24/23 10/31/23  Francesca Elsie CROME, MD  cholecalciferol (VITAMIN D3) 25 MCG (1000 UNIT) tablet Take 1 tablet (1,000 Units total) by mouth daily. 05/03/23   Norrine Sharper, MD  clopidogrel  (PLAVIX ) 75 MG tablet Take 75 mg by mouth daily.    [provider]  doxycycline  (VIBRAMYCIN ) 100 MG capsule Take 1  capsule (100 mg total) by mouth 2 (two) times daily for 7 days. 10/24/23 10/31/23  Francesca Elsie CROME, MD  escitalopram  (LEXAPRO ) 10 MG tablet Take 10 mg by mouth daily.    [provider]  gabapentin  (NEURONTIN ) 300 MG capsule Take 900 mg by mouth at bedtime.    [provider]  levothyroxine  (SYNTHROID ) 25 MCG tablet Take 1 tablet (25 mcg total) by mouth daily at 6 (six) AM. 05/03/23 08/01/23  Norrine Sharper, MD  loperamide  (IMODIUM ) 2 MG capsule Take 2 mg by mouth 4 (four) times daily as needed for diarrhea or loose stools. 03/21/21   [provider]  nitroGLYCERIN  (NITROSTAT ) 0.4 MG SL tablet Place 0.4 mg under the tongue every 5 (five) minutes as needed for chest pain.    [provider]  omeprazole (PRILOSEC) 20 MG capsule Take 20 mg by mouth in the morning.    [provider]  rosuvastatin  (CRESTOR ) 20 MG tablet Take 20 mg by mouth at bedtime. 01/12/22   [provider]  traZODone  (DESYREL ) 50 MG tablet Take 50 mg by mouth at bedtime as needed for sleep. 08/31/20   [provider]  vitamin E 1000 UNIT capsule Take 1,000 Units by mouth daily.    [provider]    Allergies: Tramadol, Budesonide , Levofloxacin, Mirabegron, Mirabegron er, Nsaids, Sulfa antibiotics, Amoxicillin, and Nystatin    Review of Systems  Respiratory:  Positive for shortness of breath.   Genitourinary:  Positive for frequency.  All other systems reviewed and are negative.   Updated  Vital Signs BP 127/80   Pulse 68   Temp 98.1 F (36.7 C) (Oral)   Resp 16   Ht 5' 3 (1.6 m)   Wt 67.6 kg   SpO2 94%   BMI 26.39 kg/m   Physical Exam Vitals and nursing note reviewed.  Constitutional:      General: She is not in acute distress.    Appearance: Normal appearance. She is not ill-appearing or diaphoretic.  HENT:     Head: Normocephalic and atraumatic.     Nose: No congestion.     Mouth/Throat:     Mouth: Mucous membranes are moist.      Pharynx: Oropharynx is clear.  Eyes:     General:        Right eye: No discharge.        Left eye: No discharge.     Extraocular Movements: Extraocular movements intact.     Conjunctiva/sclera: Conjunctivae normal.     Pupils: Pupils are equal, round, and reactive to light.  Neck:     Comments: No meningeal signs Cardiovascular:     Rate and Rhythm: Normal rate and regular rhythm.     Pulses: Normal pulses.     Heart sounds: Normal heart sounds. No murmur heard.    No friction rub. No gallop.  Pulmonary:     Effort: Pulmonary effort is normal. No respiratory distress.     Breath sounds: No stridor. Rales (Rales present to left lower lobe.) present. No wheezing or rhonchi.  Abdominal:     General: Abdomen is flat. There is no distension.     Palpations: Abdomen is soft.     Tenderness: There is no abdominal tenderness. There is no right CVA tenderness, left CVA tenderness or guarding.  Musculoskeletal:        General: No tenderness.     Cervical back: Normal range of motion and neck supple. No rigidity or tenderness.     Right lower leg: No edema.     Left lower leg: No edema.  Skin:    General: Skin is warm and dry.     Findings: No bruising, erythema or lesion.  Neurological:     General: No focal deficit present.     Mental Status: She is alert and oriented to person, place, and time. Mental status is at baseline.     Cranial Nerves: No cranial nerve deficit.     Sensory: No sensory deficit.     Motor: No weakness.     Coordination: Coordination normal.     Comments: Parkinsonian tremor noted to left hand. No facial asymmetry, no ataxia, no apraxia, no aphasia, no arm drift, normal coordination with finger-to-nose, normal sensation to both upper and lower extremities bilaterally, normal grip strength bilaterally, normal strength to both flexion and extension to both upper lower extremities 5+ bilaterally, no visual field deficits, no nystagmus.   Psychiatric:        Mood  and Affect: Mood normal.     (all labs ordered are listed, but only abnormal results are displayed) Labs Reviewed  BASIC METABOLIC PANEL WITH GFR - Abnormal; Notable for the following components:      Result Value   Potassium 3.3 (*)    CO2 19 (*)    Glucose, Bld 109 (*)    Creatinine, Ser 1.18 (*)    Calcium  8.7 (*)    GFR, Estimated 47 (*)    All other components within normal limits  CBC - Abnormal; Notable for the  following components:   MCH 34.2 (*)    All other components within normal limits  MAGNESIUM  - Abnormal; Notable for the following components:   Magnesium  1.5 (*)    All other components within normal limits  URINALYSIS, ROUTINE W REFLEX MICROSCOPIC    EKG: None  Radiology: CT Chest W Contrast Result Date: 10/27/2023 CLINICAL DATA:  Pneumonia, complication suspected, xray done EXAM: CT CHEST WITH CONTRAST TECHNIQUE: Multidetector CT imaging of the chest was performed during intravenous contrast administration. RADIATION DOSE REDUCTION: This exam was performed according to the departmental dose-optimization program which includes automated exposure control, adjustment of the mA and/or kV according to patient size and/or use of iterative reconstruction technique. CONTRAST:  75mL OMNIPAQUE  IOHEXOL  350 MG/ML SOLN COMPARISON:  Chest x-ray 10/24/2023, CT chest 04/16/2021 FINDINGS: Cardiovascular: Normal heart size. No significant pericardial effusion. The thoracic aorta is normal in caliber. Severe atherosclerotic plaque of the thoracic aorta. Four-vessel coronary artery calcifications. The main pulmonary artery is normal in caliber. No pulmonary embolus. No narrowing of the encased pulmonary arteries. Mediastinum/Nodes: Conglomerative left hilar lymphadenopathy difficult to measure measuring up to 4.5 x 3 cm on axial imaging (3:61). Mediastinal lymphadenopathy as well as right hilar lymphadenopathy with as an example a 1 cm lymph node on the right (3:73) and a precarinal 1.7 cm  lymph node (3:60). No axillary lymphadenopathy. Thyroid gland, trachea, and esophagus demonstrate no significant findings. Lungs/Pleura: Moderate emphysematous changes. Left upper lobe posterior inferior masslike consolidation extending to the left hilar region and into the left lower lobe measuring up to 9.5 x 4.5 cm. Density encases the left pulmonary arteries as well as left pulmonary proximal bronchials that appear to be narrowed from external mass effect. Left lower lobe ground-glass patchy airspace opacities distal to the mass. Trace to small volume left pleural effusion with associated multiple enhancing nodular pleural densities (3:61). No pneumothorax. Upper Abdomen: Perigastric venous collaterals. Subcentimeter left hepatic lobe hypodensity (3:138). Musculoskeletal: No chest wall abnormality. No suspicious lytic or blastic osseous lesions. No cortical erosion or destruction-specifically of the ribs adjacent to the pleural thickening. No acute displaced fracture. L1 chronic anterior wedge compression fracture. Multilevel degenerative changes of the spine. Total right shoulder arthroplasty. IMPRESSION: 1. 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 Left upper lobe 9.5 x 4.5 cm left upper lobe posteroinferior masslike consolidation extending to the left hilar region and left lower lobe. This density in cases the left pulmonary arteries as well as the left pulmonary proximal bronchials are narrowed from external mass effect. Additional imaging evaluation or consultation with Pulmonology or Thoracic Surgery recommended. 2. Left lower lobe ground-glass patchy airspace opacities distal to the mass. Finding could represent direct invasion of the mass versus postobstructive consolidation. 3. No pulmonary embolus. No narrowing of the encased pulmonary arteries. 4.  Emphysema (ICD10-J43.9). 5. Aortic Atherosclerosis (ICD10-I70.0) -including four-vessel coronary calcification. 6.  Nonspecific perigastric venous collaterals. Electronically Signed   By: Morgane  Naveau M.D.   On: 10/27/2023 18:30     Procedures   Medications Ordered in the ED  magnesium  sulfate IVPB 2 g 50 mL (2 g Intravenous New Bag/Given 10/27/23 2212)  lactated ringers  bolus 500 mL (0 mLs Intravenous Stopped 10/27/23 1915)  iohexol  (OMNIPAQUE ) 350 MG/ML injection 75 mL (75 mLs Intravenous Contrast Given 10/27/23 1804)    Clinical Course as of 10/27/23 2307  Sun Oct 27, 2023  2020 Talked to [place]. Who recommended that the patient could follow up in the outpatient setting if she is okay to  wait 2-3 weeks for diagnosis and workup. Otherwise she would recommend admission for expedited workup due to her having 2 ED visits in the last 3 days. [CB]    Clinical Course User Index [CB] Beola Terrall RAMAN, PA-C                               Medical Decision Making Amount and/or Complexity of Data Reviewed Labs: ordered. Radiology: ordered. ECG/medicine tests: ordered.  Risk Prescription drug management.   This patient is a 83 year old female who presents to the ED for concern of continued shortness of breath with desiring to get CT scans done today after being recently diagnosed with pneumonia 3 days ago, took antibiotics for 3 doses but had persistent vomiting after taking antibiotics and stopped.  Notes that she has felt much better since stopping antibiotics and actually feels significantly improved from when she was seen earlier 3 days ago.  Currently not experiencing any pain.  On physical exam, patient is in no acute distress, afebrile, alert and orient x 4, speaking in full sentences, nontachypneic, nontachycardic.  Notably has left-sided rales and rhonchi.  Regular rate and rhythm, chronic heart murmur noted, no lower leg extremities noted.  Normal neuroexam and is otherwise unremarkable.  Low suspicion for ACS, PE with patient's recent diagnosis.  However repeat labs today and get CT imaging as she  was recommended to do 3 days ago.  CT scan showed malignancy in the left upper lobe of the lung.  Discussed with patient that I will speak to oncology and will look to admit with her intermittent shortness of breath.  However patient states that she is feeling significantly better than when she was seen earlier this week, will get ambulatory sats.  Labs did show a hypomagnesia and magnesium  was provided.  Labs also did note a mild elevated creatinine which she says is from dehydration.  Provided LR.  Will repeat ECG as well.  On reevaluation, patient did well with ambulatory sats noting to have an ambulatory saturation of 93-95%.  Spoke with Dr. Lonn with oncology who with the patient wishing to go home at this time recommended that if she is willing to wait 2 to 3 weeks for diagnosis and workup that she can go home.  Was concerned that with her repeat visit to the emergency department today that she would likely need admission however with her having ambulatory sats that are normal and with desires to go home, believe that she can follow-up in the outpatient setting.  Discussed with patient and agreed that she has strict return to ED precautions given and to return for any new or worsening symptoms.  Will send her home with prednisone  as well as continuing her doxycycline .  Due to her having eczema with shortness of breath, to treat any occult infection versus emphysema exacerbation.  Will have her follow-up with PCP for reevaluation of her hypomagnesia today.  Repeat ECG was done.  Patient vital signs have remained stable throughout the course of patient's time in the ED. Low suspicion for any other emergent pathology at this time. I believe this patient is safe to be discharged. Provided strict return to ER precautions. Patient expressed agreement and understanding of plan. All questions were answered.  Differential diagnoses prior to evaluation: The emergent differential diagnosis includes, but  is not limited to, pneumonia, metastasis, ACS, heart failure, pneumothorax, metabolic disturbance, UTI, adverse drug reaction, sepsis, PE, CVA.  This  is not an exhaustive differential.   Past Medical History / Co-morbidities / Social History: GERD, Graves' disease, CAD, hypothyroidism, anxiety, MI, Parkinson's disease, fibromyalgia, hepatitis, HLD.  Additional history: Chart reviewed. Pertinent results include:   Seen 3 days ago for CAP. Noted to have some wheezing, left-sided chest wall pain for the last 2-3 days.  X-ray at the time showed left side infiltrates.  Troponin was negative.  Initially recommended to do CT imaging with contrast for further evaluation and patient refused.  With findings of pneumonia, patient was discharged with outpatient follow-up.  Sent home on cefdinir  and doxycycline .  Lab Tests/Imaging studies: I personally interpreted labs/imaging and the pertinent results include: CBC unremarkable BMP does shows a mild hypokalemia 3.3 as well as a mild hypocalcemia of 8.7, does note an elevated creatinine 1.18 likely secondary to dehydration with patient Dors that she is not drinking much she needs to.   CT scan shows left lung malignancy with bilateral hilar and mediastinal lymphadenopathy, left upper lobe lesion of measuring 9.5 x 4.5 cm also noted to have left lobe ground glass patchy airspace opacities to distal mass.  Noting emphysema as well as nonspecific perigastric venous collaterals.  No PE.  I agree with the radiologist interpretation.  Cardiac monitoring: EKG obtained and interpreted by myself and attending physician which shows: Initial ECG showed QT prolongation with sinus rhythm.   Medications: I ordered medication including LR.  I have reviewed the patients home medicines and have made adjustments as needed.  Critical Interventions: None  Social Determinants of Health: Notably wants to go home despite symptoms and wishes to be treated in the outpatient  setting without wishing to be admitted to the hospital.  Disposition: After consideration of the diagnostic results and the patients response to treatment, I feel that the patient would benefit from discharge and treatment as above.   emergency department workup does not suggest an emergent condition requiring admission or immediate intervention beyond what has been performed at this time. The plan is: Follow-up with oncology with ambulatory referral made to oncology, prednisone  and finishing doxycycline , return to the ED for new or worsening symptoms. The patient is safe for discharge and has been instructed to return immediately for worsening symptoms, change in symptoms or any other concerns.    Final diagnoses:  Malignant neoplasm of upper lobe of left lung [facility])    ED Discharge Orders          Ordered    Ambulatory referral to Hematology / Oncology        10/27/23 2257               Beola Terrall RAMAN, [place] 10/27/23 2310

## 2023-10-27 NOTE — ED Notes (Signed)
 Patient ambulated going to the bathroom, pulse ox attached with O2 @ 97% on room air

## 2023-10-27 NOTE — ED Triage Notes (Signed)
 Per [facility] pt coming from home. Diagnosed with pneumonia Thursday. Has not taken antibiotics in 2 days due to nausea and vomiting. C/o shortness of breath today. Dizziness when standing. Given 250 CC LR. Patient took 1 G  tylenol  PTA.

## 2023-10-28 DIAGNOSIS — C3412 Malignant neoplasm of upper lobe, left bronchus or lung: Secondary | ICD-10-CM | POA: Diagnosis not present

## 2023-10-28 MED ORDER — LACTATED RINGERS IV BOLUS: 1000.0000 mL | Freq: Once | INTRAVENOUS | Status: DC

## 2023-10-28 MED ORDER — POTASSIUM CHLORIDE CRYS ER 20 MEQ PO TBCR
40.0000 meq | EXTENDED_RELEASE_TABLET | Freq: Once | ORAL | Status: AC
  Administered 2023-10-28: 40 meq via ORAL
  Filled 2023-10-28: qty 2

## 2023-10-28 MED ORDER — ONDANSETRON HCL 4 MG PO TABS: 4.0000 mg | ORAL_TABLET | Freq: Four times a day (QID) | ORAL | 0 refills | Status: AC

## 2023-10-28 NOTE — Progress Notes (Addendum)
 Received RFA for evaluation of underlying lung malignancy and prolonged Qtc. She has a left peri hilar lung mass which encases the pulmonary artery and bronchials, there is bilateral hilar and mediastinal adenopathy, as well as LLL GGO possible post obstructive pneumonia, trace L pleural effusion as well as enhancing nodular pleural densities. Concern for advanced stage lung cancer, unknown type. She had been Rx'd Doxycycline  and Cefdinir  but had vomiting and stopped taking, feels well now and is ok resuming oral antibiotics. No desat with ambulation. No signs of sepsis. EDP had spoken with Dr. Lonn who offered options of expedited inpatient eval v. Outpatient evaluation. Prefers to be evaluated outside the hospital.   Otherwise notable likely dehydration, was treated with IVF. hypoMg was repleted. EDP worried about QT prolongation. However, appears she has U wave which is causing error with computer read. On my evaluation her QT is ~ 480 with Qtc of 507. Appropriately treated with Mg. I have also ordered for Kcl 40 meq. And I have added additional 1 L IVF (though IV had been removed, ok to continue oral hydration instead).   As long as patient able to tolerate oral abx and OK with plan for outpatient workup do not feel needs admission at this time. Discussed with ED PA who will discuss with patient and reach back out if thinks still requires admission.   Dorn Dawson, MD  [facility]

## 2023-10-28 NOTE — ED Notes (Signed)
 Patient son will come pick up the patient

## 2023-10-30 ENCOUNTER — Encounter: Payer: Self-pay | Admitting: Medical Oncology

## 2023-10-30 NOTE — Progress Notes (Unsigned)
 [facility] [phone]   [phone]  [facility]  REFERRING PHYSICIAN: Terrall First PA-C  REASON FOR CONSULTATION:  Lung Mass   HPI Laura Fritz is a 83 y.o. female with a past medical history significant for thyroid dysfunction, Parkinson's disease, major depressive disorder, myocardial infarction (age 5) and atherosclerosis is referred to the clinic for a lung mass. The patient is accompanied by her daughter today.   The patient's evaluation began in early July 2025. The patient presented to the emergency room on 10/24/2023 for generalized weakness, wheezing, mild cough, and left-sided chest discomfort.  She had a chest x-ray that showed left-sided infiltrate with multifocal left lung consolidations (possibly infection/inflammation).  A CT scan of the chest was recommended but the patient refused and wanted to go home. She was prescribed antibiotics with Omnicef .   She then returned to the emergency room on 10/27/2023.  She had increased shortness of breath, generalized weakness, and headaches with associated nausea/vomiting.    Unfortunately her CT scan performed on 10/27/2023 showed concerns for left upper lobe lung malignancy with 9.5 x 4.5 left upper lobe posterior-inferior mass-like consolidation extending to the left hilar region and left lower lobe. The density encases the left pulmonary arteries as well as the left pulmonary proximal bronchials are narrowed due to mass effect.   Medical oncology was consulted and hospital admission was recommended to expedite the workup. However, the patient wished to be discharged home but understood it may take 2-3 weeks for outpatient workup. She was discharged home on doxycyline and steroids.   Overall the patient is feeling tired and worn out.  She denies any fever or chills.  She had some mild damp clothing this morning when she woke up but she attributes this using a lot of blankets.  She denies  any drastic weight loss.  She may have lost a few pounds but she feels like she is eating well.  Regarding her breathing she feels like she is not able to take his big of breath which is what prompted her ER evaluation earlier this month.  She has a intermittent mild dry cough.  This is not bad enough to warrant taking any cough medication.  She denies any hemoptysis.  She has some tightness in her chest which also has been occurring since around 10/24/2023 when she first presented to the emergency room.  This also is no bad enough to warrant taking any medications per patient report.  She had a severe headache on 10/27/2023.  For which she took Tylenol , Advil, and trazodone .  She also had associated nausea/vomiting. She then went to sleep.  She states is unusual for her to have headaches.  She also had a mild headache starting yesterday that is located near her left eye and nose.  She denies any facial droop, extremity weakness, recent falls (she was hospitalized for fall and compression fracture in January 2025), or speech changes.  She denies any vision changes.  She denies any known lymphadenopathy.  She does have a subcutaneous lesion on her left anterior neck which has been there for 2 to 3 years.  She denies any recent nausea or vomiting but she does have antiemetics at home.  She also used to struggle with chronic diarrhea for many years for which she would take Imodium  on a regular basis.  However, her bowel habits have changed over the last 3 months and she is not needing to take Imodium  as much and she  reports the consistency of her stool being most consistent with marbles.  The patient's mother had lung cancer.  The patient's father had a myocardial infarction/history of bypass surgery.  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  She had 6 children total having lost 3.  She is a longtime smoker having smoked ~66 years averaging 1 ppd. She quit about 1 month ago  and had started to vape instead, although she last vaped about 2 weeks ago.    HPI  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HCC)    Parkinson disease (HCC)     Past Surgical History:  Procedure Laterality Date   ABDOMINAL HYSTERECTOMY     ANTERIOR (CYSTOCELE) AND POSTERIOR REPAIR (RECTOCELE) WITH XENFORM GRAFT AND SACROSPINOUS FIXATION     REVERSE SHOULDER ARTHROPLASTY Right 03/31/2021   Procedure: REVERSE SHOULDER ARTHROPLASTY;  Surgeon: Cristy Bonner DASEN, MD;  Location: [facility];  Service: Orthopedics;  Laterality: Right;   TUBAL LIGATION      Family History  Problem Relation Age of Onset   Lung cancer Mother    Esophageal cancer Maternal Grandfather    Esophageal cancer Cousin    Colon cancer Neg Hx    Pancreatic cancer Neg Hx    Stomach cancer Neg Hx    Liver disease Neg Hx     Social History Social History   Tobacco Use   Smoking status: Former   Smokeless tobacco: Never  Advertising account planner   Vaping status: Some Days  Substance Use Topics   Alcohol use: Not Currently   Drug use: No    Allergies  Allergen Reactions   Tramadol Anaphylaxis    Oral cavity tremors   Budesonide      Unknown reaction    Levofloxacin Other (See Comments)    Tendonitis   Mirabegron     Unknown reaction    Nsaids Rash   Sulfa Antibiotics Rash   Amoxicillin Other (See Comments) and Nausea Only   Nystatin     Unknown reaction    Current Outpatient Medications  Medication Sig Dispense Refill   buPROPion  (WELLBUTRIN  XL) 300 MG 24 hr tablet Take 300 mg by mouth every morning.     cefdinir  (OMNICEF ) 300 MG capsule Take 1 capsule (300 mg total) by mouth 2 (two) times daily for 7 days. 14 capsule 0   cholecalciferol (VITAMIN D3) 25 MCG (1000 UNIT) tablet Take  1 tablet (1,000 Units total) by mouth daily.     clopidogrel  (PLAVIX ) 75 MG tablet Take 75 mg by mouth daily.     doxycycline  (VIBRAMYCIN ) 100 MG capsule Take 1 capsule (100 mg total) by mouth 2 (two) times daily for 5 days. 10 capsule 0   escitalopram  (LEXAPRO ) 10 MG tablet Take 10 mg by mouth daily.     gabapentin  (NEURONTIN ) 300 MG capsule Take 900 mg by mouth at bedtime.     levothyroxine  (SYNTHROID ) 25 MCG tablet Take 1 tablet (25 mcg total) by mouth daily at 6 (six) AM.     loperamide  (IMODIUM ) 2 MG capsule Take 2 mg by mouth 4 (four) times daily as needed for diarrhea or loose stools.  nitroGLYCERIN  (NITROSTAT ) 0.4 MG SL tablet Place 0.4 mg under the tongue every 5 (five) minutes as needed for chest pain. (Patient not taking: Reported on 10/27/2023)     omeprazole (PRILOSEC) 20 MG capsule Take 20 mg by mouth in the morning.     ondansetron  (ZOFRAN ) 4 MG tablet Take 1 tablet (4 mg total) by mouth every 6 (six) hours. 12 tablet 0   predniSONE  (DELTASONE ) 50 MG tablet Take 1 tablet (50 mg total) by mouth daily for 5 days. 5 tablet 0   rosuvastatin  (CRESTOR ) 20 MG tablet Take 20 mg by mouth at bedtime.     traZODone  (DESYREL ) 50 MG tablet Take 50 mg by mouth at bedtime as needed for sleep.     vitamin E 1000 UNIT capsule Take 1,000 Units by mouth daily. (Patient not taking: Reported on 10/27/2023)     No current facility-administered medications for this visit.    REVIEW OF SYSTEMS:   Review of Systems  Constitutional: Positive for fatigue. Negative for appetite change, chills, fever and unexpected weight change.  HENT: Negative for mouth sores, nosebleeds, sore throat and trouble swallowing.   Eyes: Negative for eye problems and icterus.  Respiratory: Positive for shortness of breath when trying to take deep breaths. Positive for mild dry cough. Negative for hemoptysis. Cardiovascular: Positive for left chest tightness. Negative for leg swelling.  Gastrointestinal: Positive for  constipation. Negative for abdominal pain,  diarrhea, nausea and vomiting (resolved).  Genitourinary: Negative for bladder incontinence, difficulty urinating, dysuria, frequency and hematuria.  Musculoskeletal: Negative for back pain, gait problem, neck pain and neck stiffness.  Skin: Negative for itching and rash.  Neurological: Positive for headaches. Negative for dizziness, extremity weakness, gait problem, light-headedness and seizures.  Hematological: Negative for adenopathy. Does not bruise/bleed easily.  Psychiatric/Behavioral: Negative for confusion, depression and sleep disturbance. The patient is not nervous/anxious.     PHYSICAL EXAMINATION:  There were no vitals taken for this visit.  ECOG PERFORMANCE STATUS: 1  Physical Exam  Constitutional: Oriented to person, place, and time and thin appearing female, and in no distress.  HENT:  Head: Normocephalic and atraumatic.  Mouth/Throat: Subcutaneous nodule on left anterior neck. Oropharynx is clear and moist. No oropharyngeal exudate.  Eyes: Conjunctivae are normal. Right eye exhibits no discharge. Left eye exhibits no discharge. No scleral icterus.  Neck: Normal range of motion. Neck supple.  Cardiovascular: Normal rate, regular rhythm, normal heart sounds and intact distal pulses.   Pulmonary/Chest: Effort normal. Quiet breath sounds in the left lung. No respiratory distress. No wheezes. No rales.  Abdominal: Soft. Bowel sounds are normal. Exhibits no distension and no mass. There is no tenderness.  Musculoskeletal: Normal range of motion. Exhibits no edema.  Lymphadenopathy:    No cervical adenopathy.  Neurological: Alert and oriented to person, place, and time. Exhibits muscle wasting. Gait normal. Coordination normal.  Skin: Skin is warm and dry. No rash noted. Not diaphoretic. No erythema. No pallor.  Psychiatric: Mood, memory and judgment normal.  Vitals reviewed.  LABORATORY DATA: Lab Results  Component Value Date    WBC 5.6 10/27/2023   HGB 13.8 10/27/2023   HCT 39.3 10/27/2023   MCV 97.5 10/27/2023   PLT 178 10/27/2023      Chemistry      Component Value Date/Time   NA 137 10/27/2023 1528   K 3.3 (L) 10/27/2023 1528   CL 108 10/27/2023 1528   CO2 19 (L) 10/27/2023 1528   BUN 15 10/27/2023 1528  CREATININE 1.18 (H) 10/27/2023 1528      Component Value Date/Time   CALCIUM  8.7 (L) 10/27/2023 1528   ALKPHOS 67 10/24/2023 1255   AST 43 (H) 10/24/2023 1255   ALT 39 10/24/2023 1255   BILITOT 0.5 10/24/2023 1255       RADIOGRAPHIC STUDIES: CT Chest W Contrast Result Date: 10/27/2023 CLINICAL DATA:  Pneumonia, complication suspected, xray done EXAM: CT CHEST WITH CONTRAST TECHNIQUE: Multidetector CT imaging of the chest was performed during intravenous contrast administration. RADIATION DOSE REDUCTION: This exam was performed according to the departmental dose-optimization program which includes automated exposure control, adjustment of the mA and/or kV according to patient size and/or use of iterative reconstruction technique. CONTRAST:  75mL OMNIPAQUE  IOHEXOL  350 MG/ML SOLN COMPARISON:  Chest x-ray 10/24/2023, CT chest 04/16/2021 FINDINGS: Cardiovascular: Normal heart size. No significant pericardial effusion. The thoracic aorta is normal in caliber. Severe atherosclerotic plaque of the thoracic aorta. Four-vessel coronary artery calcifications. The main pulmonary artery is normal in caliber. No pulmonary embolus. No narrowing of the encased pulmonary arteries. Mediastinum/Nodes: Conglomerative left hilar lymphadenopathy difficult to measure measuring up to 4.5 x 3 cm on axial imaging (3:61). Mediastinal lymphadenopathy as well as right hilar lymphadenopathy with as an example a 1 cm lymph node on the right (3:73) and a precarinal 1.7 cm lymph node (3:60). No axillary lymphadenopathy. Thyroid gland, trachea, and esophagus demonstrate no significant findings. Lungs/Pleura: Moderate emphysematous changes.  Left upper lobe posterior inferior masslike consolidation extending to the left hilar region and into the left lower lobe measuring up to 9.5 x 4.5 cm. Density encases the left pulmonary arteries as well as left pulmonary proximal bronchials that appear to be narrowed from external mass effect. Left lower lobe ground-glass patchy airspace opacities distal to the mass. Trace to small volume left pleural effusion with associated multiple enhancing nodular pleural densities (3:61). No pneumothorax. Upper Abdomen: Perigastric venous collaterals. Subcentimeter left hepatic lobe hypodensity (3:138). Musculoskeletal: No chest wall abnormality. No suspicious lytic or blastic osseous lesions. No cortical erosion or destruction-specifically of the ribs adjacent to the pleural thickening. No acute displaced fracture. L1 chronic anterior wedge compression fracture. Multilevel degenerative changes of the spine. Total right shoulder arthroplasty. IMPRESSION: 1. 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 Left upper lobe 9.5 x 4.5 cm left upper lobe posteroinferior masslike consolidation extending to the left hilar region and left lower lobe. This density in cases the left pulmonary arteries as well as the left pulmonary proximal bronchials are narrowed from external mass effect. Additional imaging evaluation or consultation with Pulmonology or Thoracic Surgery recommended. 2. Left lower lobe ground-glass patchy airspace opacities distal to the mass. Finding could represent direct invasion of the mass versus postobstructive consolidation. 3. No pulmonary embolus. No narrowing of the encased pulmonary arteries. 4.  Emphysema (ICD10-J43.9). 5. Aortic Atherosclerosis (ICD10-I70.0) -including four-vessel coronary calcification. 6. Nonspecific perigastric venous collaterals. Electronically Signed   By: Morgane  Naveau M.D.   On: 10/27/2023 18:30   DG Chest 2 View Result Date: 10/24/2023 CLINICAL DATA:   Chest pain EXAM: CHEST - 2 VIEW COMPARISON:  Chest radiograph April 28, 2023 FINDINGS: The heart size is borderline normal. Aortic knob is calcified. There is patchy areas of consolidation involving the left upper lobe and posterior aspects of left lower lobe, new to prior. Trace left-sided pleural effusion along the major fissure and blunting of left costophrenic angle. Status post reverse right shoulder arthroplasty. IMPRESSION: Suggestion of multifocal left lung consolidations (possibly infection/inflammation). Recommend chest  CT for further assessment. Trace left pleural effusion. Electronically Signed   By: Megan  Zare M.D.   On: 10/24/2023 14:08    ASSESSMENT: This is a very pleasant 83 year old Caucasian female referred to our clinic for suspected lung cancer pending further workup.  She was found to have a 9.5 x 4.5 cm left upper lobe posterior inferior masslike consolidation extending into the left hilar region and left lower lobe as well as bilateral hilar and mediastinal lymphadenopathy.  The density encases the left pulmonary artery as well as the left proximal bronchioles are narrowed from external mass effect.   Lung Mass - The patient was seen with Dr. Federico today. - Discussed that this is highly suspicious for lung cancer but tissue biopsy is required to determine treatment options. - The patient did mention that she likely would not be interested in any chemotherapy or radiation if recommended but she is still interested in working her condition up so she can understand her options and make an informed decision. She prefers quality of life over aggressive treatment. -I placed an urgent pulmonary medicine to help facilitate getting a biopsy/bronchoscopy scheduled promptly - She will also need to complete the staging workup with a brain MRI and a PET scan.  I have placed these orders. Given her headaches, which is abnormal for her, I have ordered the brain MRI stat.  -We will make a  place holder appointment for ~2.5 weeks from now to review the results and treatment options depending on the stage and biopsy results.  - The patients understands that outpatient workup may take longer vs inpatient workup. She understands the recommendation in the ER but she prefers to have outpatient workup. I did let her know I would have a low threshold should she develop any concerning symptoms in the interval.  -I strongly discouraged vaping. Advise cessation of vaping and smoking -The patient declined any cough medications or pain medications at this time. She has anti-emetics if needed -She will continue her antibiotics for the post obstructive consolidation -We will perform ambulatory walking test today to see if she qualifies for supplemental oxygen.   The patient voices understanding of current disease status and treatment options and is in agreement with the current care plan.  All questions were answered. The patient knows to call the clinic with any problems, questions or concerns. We can certainly see the patient much sooner if necessary.  Thank you so much for allowing me to participate in the care of [facility]. I will continue to follow up the patient with you and assist in her care.   Disclaimer: This note was dictated with voice recognition software. Similar sounding words can inadvertently be transcribed and may not be corrected upon review.   Javarious Elsayed L Celena Lanius October 30, 2023, 12:31 PM  I have read the above note and personally examined the patient. I agree with the assessment and plan as noted above.  Briefly Mrs. Laura Fritz is an 83 year old female who presents for evaluation of a newly diagnosed lung mass.  She presented to the emergency department on 10/24/2023.  She had an x-ray performed with no concerning abnormalities and was discharged.  She returned again on 10/27/2023 with worsening symptoms.  CT scan of the chest at the time showed a 9.5 x 4.5 cm left  upper lobe mass.  Due to concern for these findings the patient was referred to the rapid diagnostic clinic.  At this time findings are highly concerning for a primary lung  cancer.  Additionally she is having a headache and therefore we will perform an MRI of the brain in addition to her staging PET CT scan.  She will also require evaluation with pulmonary service for consideration of biopsy in the event the patient is found to have lung cancer would recommend transfer of her care to Dr. Sherrod.  The patient notes that she may not want intensive intervention and we did briefly discuss today the different treatment options which could include oral therapy if she had the right mutational panel.  Patient voiced understanding of our findings and plan moving forward.  She was eager to pursue the workup.   Norleen IVAR Kidney, MD Department of Hematology/Oncology [facility] at [facility] Phone: [phone] Pager: [phone] Email: [email or web address]

## 2023-11-01 ENCOUNTER — Other Ambulatory Visit: Payer: Self-pay | Admitting: Physician Assistant

## 2023-11-01 ENCOUNTER — Other Ambulatory Visit: Payer: Self-pay

## 2023-11-01 ENCOUNTER — Inpatient Hospital Stay: Attending: Physician Assistant | Admitting: Physician Assistant

## 2023-11-01 ENCOUNTER — Inpatient Hospital Stay

## 2023-11-01 VITALS — BP 115/69 | HR 72 | Temp 97.9°F | Resp 15 | Wt 143.1 lb

## 2023-11-01 DIAGNOSIS — Z882 Allergy status to sulfonamides status: Secondary | ICD-10-CM | POA: Insufficient documentation

## 2023-11-01 DIAGNOSIS — Z90710 Acquired absence of both cervix and uterus: Secondary | ICD-10-CM | POA: Insufficient documentation

## 2023-11-01 DIAGNOSIS — C3412 Malignant neoplasm of upper lobe, left bronchus or lung: Secondary | ICD-10-CM | POA: Diagnosis present

## 2023-11-01 DIAGNOSIS — G893 Neoplasm related pain (acute) (chronic): Secondary | ICD-10-CM | POA: Diagnosis not present

## 2023-11-01 DIAGNOSIS — M4856XA Collapsed vertebra, not elsewhere classified, lumbar region, initial encounter for fracture: Secondary | ICD-10-CM | POA: Insufficient documentation

## 2023-11-01 DIAGNOSIS — J439 Emphysema, unspecified: Secondary | ICD-10-CM | POA: Insufficient documentation

## 2023-11-01 DIAGNOSIS — J90 Pleural effusion, not elsewhere classified: Secondary | ICD-10-CM | POA: Insufficient documentation

## 2023-11-01 DIAGNOSIS — R519 Headache, unspecified: Secondary | ICD-10-CM | POA: Diagnosis not present

## 2023-11-01 DIAGNOSIS — Z79890 Hormone replacement therapy (postmenopausal): Secondary | ICD-10-CM | POA: Insufficient documentation

## 2023-11-01 DIAGNOSIS — R0789 Other chest pain: Secondary | ICD-10-CM | POA: Insufficient documentation

## 2023-11-01 DIAGNOSIS — Z8249 Family history of ischemic heart disease and other diseases of the circulatory system: Secondary | ICD-10-CM | POA: Insufficient documentation

## 2023-11-01 DIAGNOSIS — C7951 Secondary malignant neoplasm of bone: Secondary | ICD-10-CM | POA: Insufficient documentation

## 2023-11-01 DIAGNOSIS — K7689 Other specified diseases of liver: Secondary | ICD-10-CM | POA: Insufficient documentation

## 2023-11-01 DIAGNOSIS — K5900 Constipation, unspecified: Secondary | ICD-10-CM | POA: Diagnosis not present

## 2023-11-01 DIAGNOSIS — R194 Change in bowel habit: Secondary | ICD-10-CM

## 2023-11-01 DIAGNOSIS — R918 Other nonspecific abnormal finding of lung field: Secondary | ICD-10-CM

## 2023-11-01 DIAGNOSIS — Z880 Allergy status to penicillin: Secondary | ICD-10-CM | POA: Insufficient documentation

## 2023-11-01 DIAGNOSIS — G20A1 Parkinson's disease without dyskinesia, without mention of fluctuations: Secondary | ICD-10-CM | POA: Insufficient documentation

## 2023-11-01 DIAGNOSIS — J948 Other specified pleural conditions: Secondary | ICD-10-CM | POA: Diagnosis not present

## 2023-11-01 DIAGNOSIS — Z800 Family history of malignant neoplasm of digestive organs: Secondary | ICD-10-CM | POA: Insufficient documentation

## 2023-11-01 DIAGNOSIS — R5383 Other fatigue: Secondary | ICD-10-CM | POA: Diagnosis not present

## 2023-11-01 DIAGNOSIS — I252 Old myocardial infarction: Secondary | ICD-10-CM | POA: Diagnosis not present

## 2023-11-01 DIAGNOSIS — F32A Depression, unspecified: Secondary | ICD-10-CM | POA: Insufficient documentation

## 2023-11-01 DIAGNOSIS — M47812 Spondylosis without myelopathy or radiculopathy, cervical region: Secondary | ICD-10-CM | POA: Insufficient documentation

## 2023-11-01 DIAGNOSIS — J9601 Acute respiratory failure with hypoxia: Secondary | ICD-10-CM

## 2023-11-01 DIAGNOSIS — R590 Localized enlarged lymph nodes: Secondary | ICD-10-CM | POA: Diagnosis not present

## 2023-11-01 DIAGNOSIS — D509 Iron deficiency anemia, unspecified: Secondary | ICD-10-CM

## 2023-11-01 DIAGNOSIS — E079 Disorder of thyroid, unspecified: Secondary | ICD-10-CM | POA: Insufficient documentation

## 2023-11-01 DIAGNOSIS — Z885 Allergy status to narcotic agent status: Secondary | ICD-10-CM | POA: Insufficient documentation

## 2023-11-01 DIAGNOSIS — R112 Nausea with vomiting, unspecified: Secondary | ICD-10-CM | POA: Insufficient documentation

## 2023-11-01 DIAGNOSIS — I2510 Atherosclerotic heart disease of native coronary artery without angina pectoris: Secondary | ICD-10-CM | POA: Insufficient documentation

## 2023-11-01 DIAGNOSIS — Z881 Allergy status to other antibiotic agents status: Secondary | ICD-10-CM | POA: Insufficient documentation

## 2023-11-01 DIAGNOSIS — M8580 Other specified disorders of bone density and structure, unspecified site: Secondary | ICD-10-CM | POA: Insufficient documentation

## 2023-11-01 DIAGNOSIS — I700 Atherosclerosis of aorta: Secondary | ICD-10-CM | POA: Diagnosis not present

## 2023-11-01 DIAGNOSIS — Z886 Allergy status to analgesic agent status: Secondary | ICD-10-CM | POA: Insufficient documentation

## 2023-11-01 DIAGNOSIS — R911 Solitary pulmonary nodule: Secondary | ICD-10-CM | POA: Insufficient documentation

## 2023-11-01 DIAGNOSIS — C779 Secondary and unspecified malignant neoplasm of lymph node, unspecified: Secondary | ICD-10-CM | POA: Insufficient documentation

## 2023-11-01 DIAGNOSIS — F17290 Nicotine dependence, other tobacco product, uncomplicated: Secondary | ICD-10-CM | POA: Insufficient documentation

## 2023-11-01 DIAGNOSIS — Z7902 Long term (current) use of antithrombotics/antiplatelets: Secondary | ICD-10-CM | POA: Insufficient documentation

## 2023-11-01 DIAGNOSIS — Z79899 Other long term (current) drug therapy: Secondary | ICD-10-CM | POA: Insufficient documentation

## 2023-11-01 DIAGNOSIS — Z801 Family history of malignant neoplasm of trachea, bronchus and lung: Secondary | ICD-10-CM | POA: Insufficient documentation

## 2023-11-01 LAB — CBC WITH DIFFERENTIAL (CANCER CENTER ONLY)
Abs Immature Granulocytes: 0.03 K/uL (ref 0.00–0.07)
Basophils Absolute: 0 K/uL (ref 0.0–0.1)
Basophils Relative: 0 %
Eosinophils Absolute: 0 K/uL (ref 0.0–0.5)
Eosinophils Relative: 0 %
HCT: 39.1 % (ref 36.0–46.0)
Hemoglobin: 13 g/dL (ref 12.0–15.0)
Immature Granulocytes: 0 %
Lymphocytes Relative: 23 %
Lymphs Abs: 1.9 K/uL (ref 0.7–4.0)
MCH: 32.8 pg (ref 26.0–34.0)
MCHC: 33.2 g/dL (ref 30.0–36.0)
MCV: 98.7 fL (ref 80.0–100.0)
Monocytes Absolute: 0.3 K/uL (ref 0.1–1.0)
Monocytes Relative: 3 %
Neutro Abs: 5.9 K/uL (ref 1.7–7.7)
Neutrophils Relative %: 74 %
Platelet Count: 203 K/uL (ref 150–400)
RBC: 3.96 MIL/uL (ref 3.87–5.11)
RDW: 14.8 % (ref 11.5–15.5)
WBC Count: 8.1 K/uL (ref 4.0–10.5)
nRBC: 0 % (ref 0.0–0.2)

## 2023-11-01 LAB — CMP (CANCER CENTER ONLY)
ALT: 14 U/L (ref 0–44)
AST: 21 U/L (ref 15–41)
Albumin: 3.6 g/dL (ref 3.5–5.0)
Alkaline Phosphatase: 72 U/L (ref 38–126)
Anion gap: 5 (ref 5–15)
BUN: 27 mg/dL — ABNORMAL HIGH (ref 8–23)
CO2: 28 mmol/L (ref 22–32)
Calcium: 9.1 mg/dL (ref 8.9–10.3)
Chloride: 106 mmol/L (ref 98–111)
Creatinine: 1.15 mg/dL — ABNORMAL HIGH (ref 0.44–1.00)
GFR, Estimated: 48 mL/min — ABNORMAL LOW (ref >60)
Glucose, Bld: 97 mg/dL (ref 70–99)
Potassium: 4.2 mmol/L (ref 3.5–5.1)
Sodium: 139 mmol/L (ref 135–145)
Total Bilirubin: 0.4 mg/dL (ref 0.0–1.2)
Total Protein: 6.2 g/dL — ABNORMAL LOW (ref 6.5–8.1)

## 2023-11-01 LAB — MAGNESIUM: Magnesium: 1.5 mg/dL — ABNORMAL LOW (ref 1.7–2.4)

## 2023-11-01 MED ORDER — MAGNESIUM OXIDE -MG SUPPLEMENT 400 (240 MG) MG PO TABS: 400.0000 mg | ORAL_TABLET | Freq: Every day | ORAL | 2 refills | Status: DC

## 2023-11-01 NOTE — Progress Notes (Unsigned)
 SATURATION QUALIFICATIONS: (This note is used to comply with regulatory documentation for home oxygen)  Patient Saturations on Room Air at Rest = 94%  Patient Saturations on Room Air while Ambulating = 83%  Patient Saturations on 2 Liters of oxygen while Ambulating = 88%  Please briefly explain why patient needs home oxygen: She meets criteria for Oxygen

## 2023-11-01 NOTE — Addendum Note (Signed)
 Addended by: Abron Neddo B on: 11/01/2023 03:33 PM   Modules accepted: Orders

## 2023-11-01 NOTE — Progress Notes (Signed)
 I met the pt for the first time today face to face at her medical oncology consult with C.Heilingoetter, Onc PA and Dr. Federico.  The plan for the pt is to complete her staging work up with a brain MRI and PET scan, and connect her with pulmonology to arrange for a biopsy. I offered to schedule the PET and MRI for the pt and asked if she would be willing to go to [place] if a PET scan could be done sooner than it would at [facility]. Amy is the provider of the pt's transportation, and both said they would be fine going to  if needed.  I was able to arrange an appt with [facility] prior to meeting the pt. Pt and Amy notified of the date and time of the appt. 7/17 at 9am with Lauraine Lites, NP during consult appt.  I provided my card with my direct contact information and encouraged the pt and Amy to call me if they have any questions or concerns.

## 2023-11-04 ENCOUNTER — Ambulatory Visit: Payer: Self-pay

## 2023-11-04 NOTE — Telephone Encounter (Signed)
 Spoke with patient regarding recent lab results. Per Cassie, PA, the patient's magnesium  level is low. A magnesium  supplement has been sent to the pharmacy. Patient voiced understanding.  Patient inquired about the safety of having a brain MRI due to a history of reverse shoulder arthroplasty in 2022. Contacted [facility] MRI department, and they confirmed it is safe for the patient to undergo a brain MRI.

## 2023-11-04 NOTE — Progress Notes (Signed)
 Faxed completed oxygen order to [facility] at [phone] with confirmation.

## 2023-11-06 ENCOUNTER — Encounter: Payer: Self-pay | Admitting: Oncology

## 2023-11-07 ENCOUNTER — Telehealth: Payer: Self-pay | Admitting: Acute Care

## 2023-11-07 ENCOUNTER — Encounter: Payer: Self-pay | Admitting: Acute Care

## 2023-11-07 ENCOUNTER — Ambulatory Visit (HOSPITAL_COMMUNITY)
Admission: RE | Admit: 2023-11-07 | Discharge: 2023-11-07 | Disposition: A | Discharge status: Home / Self Care | Source: Ambulatory Visit | Attending: Physician Assistant | Admitting: Physician Assistant

## 2023-11-07 ENCOUNTER — Ambulatory Visit (INDEPENDENT_AMBULATORY_CARE_PROVIDER_SITE_OTHER): Admitting: Acute Care

## 2023-11-07 ENCOUNTER — Encounter: Payer: Self-pay | Admitting: Emergency Medicine

## 2023-11-07 VITALS — BP 88/60 | HR 89 | Ht 63.0 in | Wt 137.8 lb

## 2023-11-07 DIAGNOSIS — R519 Headache, unspecified: Secondary | ICD-10-CM

## 2023-11-07 DIAGNOSIS — R590 Localized enlarged lymph nodes: Secondary | ICD-10-CM | POA: Diagnosis not present

## 2023-11-07 DIAGNOSIS — R918 Other nonspecific abnormal finding of lung field: Secondary | ICD-10-CM

## 2023-11-07 DIAGNOSIS — F17200 Nicotine dependence, unspecified, uncomplicated: Secondary | ICD-10-CM

## 2023-11-07 MED ORDER — GADOBUTROL 1 MMOL/ML IV SOLN
6.0000 mL | Freq: Once | INTRAVENOUS | Status: AC | PRN
  Administered 2023-11-07: 6 mL via INTRAVENOUS

## 2023-11-07 NOTE — Telephone Encounter (Signed)
 Please schedule the following:  Provider performing procedure: [place] Diagnosis:  Lung mass, lymphadenoapthy Which side if for nodule / mass?  Left  Procedure:  robotic assisted navigational bronchoscopy with biopsies and EBUS  Has patient been spoken to by Provider and given informed consent?  Yes, to Renzo Vincelette NP on 11/07/2023 Anesthesia:  General Do you need Fluro?  Yes Duration of procedure: 1.5 hours Date: 11/18/2023 Alternate Date:  Openings on 11/18/2023 Time:  Prefers an arrival time ( Not procedure time) of 10 am if possible Location:  [facility] Does patient have OSA?  NO DM?  No Or Latex allergy? No Medication Restriction/ Anticoagulate/Antiplatelet:  Plavix  will need to be held x 5 days Pre-op Labs Ordered:determined by Anesthesia Imaging request:  CT scan done 10/27/2023  (If, SuperDimension CT Chest, please have STAT courier sent to ENDO)  Last dose Plavix  11/12/2023. Stop the Advil 48 hours before.

## 2023-11-07 NOTE — Patient Instructions (Addendum)
 It is good to see you today. We have reviewed your CT scan results . You have agreed to move forward with bronchoscopy and biopsy. I have placed an order for a bronchoscopy with biopsies.  We have discussed the procedure in detail.  We have reviewed the risks and benefits of the procedure. These include bleeding, infection, puncture of the lung, and adverse reaction to anesthesia. You have agreed to proceed with biopsy to evaluate the left upper lobe nodule. Your procedure will be done by Dr. Lamar Chris. You will receive a letter today with date time and information pertaining to the procedure. You will need someone to drive you to the procedure, stay with you during the procedure, and stay with you after the procedure. You will also need someone to stay with you for 24 hours after anesthesia to ensure you have cleared and are doing well. You will follow-up with me 1 week after the procedure to review the results and to ensure you are doing well. Call if you need us  prior to the procedure or if you have any questions at all. Please contact office for sooner follow up if symptoms do not improve or worsen or seek emergency care .  Please work on quitting vaping. Please contact office for sooner follow up if symptoms do not improve or worsen or seek emergency care

## 2023-11-07 NOTE — Telephone Encounter (Signed)
 Letter given by the nurse case# [id number] I will send to [facility] to get the auth

## 2023-11-07 NOTE — Progress Notes (Signed)
 History of Present Illness Laura Fritz is a 83 y.o. female former smoker x 1 month , current vaper referred for evaluation and consideration of navigational bronchoscopy with biopsies for a lung mass by [facility], oncology. She will be followed by Dr. Shelah.   Synopsis Laura Fritz is a 83 y.o. female with a past medical history significant for thyroid dysfunction, Parkinson's disease, major depressive disorder, myocardial infarction (age 13) and atherosclerosis is referred to the clinic for a lung mass.    The patient's evaluation began in early July 2025. The patient presented to the emergency room on 10/24/2023 for generalized weakness, wheezing, mild cough, and left-sided chest discomfort.  She had a chest x-ray that showed left-sided infiltrate with multifocal left lung consolidations (possibly infection/inflammation).  A CT scan of the chest was recommended but the patient refused and wanted to go home. She was prescribed antibiotics with Omnicef .    She then returned to the emergency room on 10/27/2023.  She had increased shortness of breath, generalized weakness, and headaches with associated nausea/vomiting.     Unfortunately her CT scan performed on 10/27/2023 showed concerns for left upper lobe lung malignancy with 9.5 x 4.5 cm left upper lobe posterior-inferior mass-like consolidation extending to the left hilar region and left lower lobe. The density encases the left pulmonary arteries as well as the left pulmonary proximal bronchials are narrowed due to mass effect.    Medical oncology was consulted and hospital admission was recommended to expedite the workup. However, the patient wished to be discharged home but understood it may take 2-3 weeks for outpatient workup. She was discharged home on doxycyline and steroids.   Family history of lung cancer in her mother, esophageal cancer in her maternal grandfather, and cousin.Her father passed away after an MI and history of  Bypass surgery.     11/07/2023 Pt. Presents for consult after discovery of a 9.5 x 4.5 cm left upper lobe posterior-inferior mass-like consolidation extending to the left hilar region and left lower lobe. This was a finding during a work up in the ED for weakness, wheezing, mild cough, and left-sided chest discomfort. Pt. Is a former smoker, and current vaper. She has a 66 pack year smoking history. She smoked about 1 PPD until about 1 month ago when sge quit. She has been vaping since.   She denies  weight loss and hemoptysis. She endorses chest pain and inability to take a deep breath. She states she feels no better after antibiotics. She denies fever, or discolored secretions.  She endorses headaches since  about 10/24/2023, generalized pain, no facial droop extremity weakness of speech changes. MR Brain has been ordered and is scheduled for today as well as a PET scan which is scheduled for 7/22.   She did have a reverse shoulder arthroplasty of right shoulder . Implants: Tornier size 3B long stem, 0 high offset tray set to 6:00, 36+6 polyethylene, 36 standard glenosphere with a 25 standard baseplate, 40 center screw . We are calling MRI to ensure she is still good to have this done with the implant.  She complains of new shortness of breath. She is wearing oxygen at home as needed. She just received this 2 days ago. She states she does not feel any better after antibiotic. Her oxygen saturations re 92% on RA today.She is not on her oxygen.She does not have an oxygen saturation monitor.  We discussed the next step in her plan of care is for biopsy of  the lung mass. We discussed the procedure, the risks and benefits in detail. We discussed risks of the procedure to include bleeding, infection, pneumothorax, and adverse reaction to anesthesia. She understands she will need to come off her plavix  x 5 days prior to procedure. We will make sure her PCP is aware of the medication hold.  She is here with  her daughter.Both are in agreement to move forward with bronchoscopy with biopsies. While seeing Cassandra Heilingoetter PA, patient did mention that she likely would not be interested in any chemotherapy or radiation if recommended but she is still interested in working her condition up so she can understand her options and make an informed decision. She prefers quality of life over aggressive treatment.   I discussed quitting vaping and smoking completely moving forward.  She verbalized understanding.  She has a letter and specific directions for the procedure. Neither she or her daughter had any questions at completion of the office visit.  Test Results: CT Chest with contrast 10/27/2023 IMPRESSION: 1. 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 Left upper lobe 9.5 x 4.5 cm left upper lobe posteroinferior masslike consolidation extending to the left hilar region and left lower lobe. This density in cases the left pulmonary arteries as well as the left pulmonary proximal bronchials are narrowed from external mass effect. Additional imaging evaluation or consultation with Pulmonology or Thoracic Surgery recommended. 2. Left lower lobe ground-glass patchy airspace opacities distal to the mass. Finding could represent direct invasion of the mass versus postobstructive consolidation. 3. No pulmonary embolus. No narrowing of the encased pulmonary arteries. 4.  Emphysema (ICD10-J43.9). 5. Aortic Atherosclerosis (ICD10-I70.0) -including four-vessel coronary calcification. 6. Nonspecific perigastric venous collaterals.    Latest Ref Rng & Units 11/01/2023    1:08 PM 10/27/2023    3:28 PM 10/24/2023   12:55 PM  CBC  WBC 4.0 - 10.5 K/uL 8.1  5.6  5.1   Hemoglobin 12.0 - 15.0 g/dL 86.9  86.1  86.6   Hematocrit 36.0 - 46.0 % 39.1  39.3  40.2   Platelets 150 - 400 K/uL 203  178  190        Latest Ref Rng & Units 11/01/2023    1:08 PM 10/27/2023    3:28 PM 10/24/2023    12:55 PM  BMP  Glucose 70 - 99 mg/dL 97  890  893   BUN 8 - 23 mg/dL 27  15  11    Creatinine 0.44 - 1.00 mg/dL 8.84  8.81  8.92   Sodium 135 - 145 mmol/L 139  137  140   Potassium 3.5 - 5.1 mmol/L 4.2  3.3  3.7   Chloride 98 - 111 mmol/L 106  108  108   CO2 22 - 32 mmol/L 28  19  24    Calcium  8.9 - 10.3 mg/dL 9.1  8.7  8.8     BNP No results found for: BNP  ProBNP No results found for: PROBNP  PFT No results found for: FEV1PRE, FEV1POST, FVCPRE, FVCPOST, TLC, DLCOUNC, PREFEV1FVCRT, PSTFEV1FVCRT  CT Chest W Contrast Result Date: 10/27/2023 CLINICAL DATA:  Pneumonia, complication suspected, xray done EXAM: CT CHEST WITH CONTRAST TECHNIQUE: Multidetector CT imaging of the chest was performed during intravenous contrast administration. RADIATION DOSE REDUCTION: This exam was performed according to the departmental dose-optimization program which includes automated exposure control, adjustment of the mA and/or kV according to patient size and/or use of iterative reconstruction technique. CONTRAST:  75mL OMNIPAQUE  IOHEXOL  350 MG/ML SOLN  COMPARISON:  Chest x-ray 10/24/2023, CT chest 04/16/2021 FINDINGS: Cardiovascular: Normal heart size. No significant pericardial effusion. The thoracic aorta is normal in caliber. Severe atherosclerotic plaque of the thoracic aorta. Four-vessel coronary artery calcifications. The main pulmonary artery is normal in caliber. No pulmonary embolus. No narrowing of the encased pulmonary arteries. Mediastinum/Nodes: Conglomerative left hilar lymphadenopathy difficult to measure measuring up to 4.5 x 3 cm on axial imaging (3:61). Mediastinal lymphadenopathy as well as right hilar lymphadenopathy with as an example a 1 cm lymph node on the right (3:73) and a precarinal 1.7 cm lymph node (3:60). No axillary lymphadenopathy. Thyroid gland, trachea, and esophagus demonstrate no significant findings. Lungs/Pleura: Moderate emphysematous changes. Left upper  lobe posterior inferior masslike consolidation extending to the left hilar region and into the left lower lobe measuring up to 9.5 x 4.5 cm. Density encases the left pulmonary arteries as well as left pulmonary proximal bronchials that appear to be narrowed from external mass effect. Left lower lobe ground-glass patchy airspace opacities distal to the mass. Trace to small volume left pleural effusion with associated multiple enhancing nodular pleural densities (3:61). No pneumothorax. Upper Abdomen: Perigastric venous collaterals. Subcentimeter left hepatic lobe hypodensity (3:138). Musculoskeletal: No chest wall abnormality. No suspicious lytic or blastic osseous lesions. No cortical erosion or destruction-specifically of the ribs adjacent to the pleural thickening. No acute displaced fracture. L1 chronic anterior wedge compression fracture. Multilevel degenerative changes of the spine. Total right shoulder arthroplasty. IMPRESSION: 1. 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 Left upper lobe 9.5 x 4.5 cm left upper lobe posteroinferior masslike consolidation extending to the left hilar region and left lower lobe. This density in cases the left pulmonary arteries as well as the left pulmonary proximal bronchials are narrowed from external mass effect. Additional imaging evaluation or consultation with Pulmonology or Thoracic Surgery recommended. 2. Left lower lobe ground-glass patchy airspace opacities distal to the mass. Finding could represent direct invasion of the mass versus postobstructive consolidation. 3. No pulmonary embolus. No narrowing of the encased pulmonary arteries. 4.  Emphysema (ICD10-J43.9). 5. Aortic Atherosclerosis (ICD10-I70.0) -including four-vessel coronary calcification. 6. Nonspecific perigastric venous collaterals. Electronically Signed   By: Morgane  Naveau M.D.   On: 10/27/2023 18:30   DG Chest 2 View Result Date: 10/24/2023 CLINICAL DATA:  Chest pain  EXAM: CHEST - 2 VIEW COMPARISON:  Chest radiograph April 28, 2023 FINDINGS: The heart size is borderline normal. Aortic knob is calcified. There is patchy areas of consolidation involving the left upper lobe and posterior aspects of left lower lobe, new to prior. Trace left-sided pleural effusion along the major fissure and blunting of left costophrenic angle. Status post reverse right shoulder arthroplasty. IMPRESSION: Suggestion of multifocal left lung consolidations (possibly infection/inflammation). Recommend chest CT for further assessment. Trace left pleural effusion. Electronically Signed   By: Megan  Zare M.D.   On: 10/24/2023 14:08     Past medical hx 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HCC)    Parkinson disease (HCC)      Social History   Tobacco Use   Smoking status: Former   Smokeless tobacco: Never  Vaping Use   Vaping status: Some Days  Substance Use Topics   Alcohol use: Not Currently   Drug use: No    Ms.Briguglio reports that she has quit smoking. She has never used smokeless tobacco. She reports that she does not currently use alcohol. She reports that she does not use drugs.  Tobacco Cessation: 66 pack year smoking history , quit 3 weeks ago, currently vapes Current every day smoker , I spent 3-4 minutes counseling patient on  steps to stop use of tobacco products. I have provided patient with information on receiving free nicotine replacement therapy, and contact numbers for hypnosis for smoking cessation as well as acupuncture for smoking cessation.   Past surgical hx, Family hx, Social hx all reviewed.  Current Outpatient Medications on File Prior to Visit  Medication Sig   buPROPion  (WELLBUTRIN  XL) 300 MG 24 hr tablet  Take 300 mg by mouth every morning.   cholecalciferol (VITAMIN D3) 25 MCG (1000 UNIT) tablet Take 1 tablet (1,000 Units total) by mouth daily.   clopidogrel  (PLAVIX ) 75 MG tablet Take 75 mg by mouth daily.   escitalopram  (LEXAPRO ) 10 MG tablet Take 10 mg by mouth daily.   gabapentin  (NEURONTIN ) 300 MG capsule Take 900 mg by mouth at bedtime.   levothyroxine  (SYNTHROID ) 25 MCG tablet Take 1 tablet (25 mcg total) by mouth daily at 6 (six) AM.   loperamide  (IMODIUM ) 2 MG capsule Take 2 mg by mouth 4 (four) times daily as needed for diarrhea or loose stools.   magnesium  oxide (MAG-OX) 400 (240 Mg) MG tablet Take 1 tablet (400 mg total) by mouth daily.   nitroGLYCERIN  (NITROSTAT ) 0.4 MG SL tablet Place 0.4 mg under the tongue every 5 (five) minutes as needed for chest pain.   omeprazole (PRILOSEC) 20 MG capsule Take 20 mg by mouth in the morning.   ondansetron  (ZOFRAN ) 4 MG tablet Take 1 tablet (4 mg total) by mouth every 6 (six) hours.   rosuvastatin  (CRESTOR ) 20 MG tablet Take 20 mg by mouth at bedtime.   traZODone  (DESYREL ) 50 MG tablet Take 50 mg by mouth at bedtime as needed for sleep. (Patient not taking: Reported on 11/07/2023)   vitamin E 1000 UNIT capsule Take 1,000 Units by mouth daily. (Patient not taking: Reported on 11/07/2023)   No current facility-administered medications on file prior to visit.     Allergies  Allergen Reactions   Tramadol Anaphylaxis    Oral cavity tremors   Budesonide      Unknown reaction    Levofloxacin Other (See Comments)    Tendonitis   Mirabegron     Unknown reaction    Nsaids Rash   Sulfa Antibiotics Rash   Amoxicillin Other (See Comments) and Nausea Only   Nystatin     Unknown reaction    Review Of Systems:  Constitutional:   No  weight loss, night sweats,  Fevers, chills, fatigue, or  lassitude.  HEENT:   + headaches,  No Difficulty swallowing,  Tooth/dental problems, or  Sore throat,                No sneezing, itching, ear ache, nasal  congestion, post nasal drip,   CV:  + chest pain,  No Orthopnea, PND, swelling in lower extremities, anasarca, dizziness, palpitations, syncope.   GI  No heartburn, indigestion, abdominal pain, nausea, vomiting, diarrhea, change in bowel habits, loss of appetite, bloody stools.   Resp: + shortness of breath with exertion less at rest.  No excess mucus, + productive cough,  No non-productive cough,  No coughing up of blood.  No change in color of mucus.  No wheezing.  No chest wall deformity  Skin: no rash or lesions.  GU: no dysuria, change in color of urine, no urgency or frequency.  No flank pain, no hematuria   MS:  No joint pain or swelling.  + decreased range of motion.  No back pain.  Psych:  No change in mood or affect. No depression or anxiety.  No memory loss.   Vital Signs BP (!) 88/60 (BP Location: Left Arm, Patient Position: Sitting, Cuff Size: Normal)   Pulse 89   Ht 5' 3 (1.6 m)   Wt 137 lb 12.8 oz (62.5 kg)   SpO2 92%   BMI 24.41 kg/m    Physical Exam:  General- No distress,  A&Ox3, pleasant, HOH ENT: No sinus tenderness, TM clear, pale nasal mucosa, no oral exudate,no post nasal drip, no LAN Cardiac: S1, S2, regular rate and rhythm, no murmur Chest: No wheeze/ rales/ dullness; no accessory muscle use, no nasal flaring, no sternal retractions, diminished per left base Abd.: Soft Non-tender, ND, BS +, Body mass index is 24.41 kg/m.  Ext: No clubbing cyanosis, edema, no obvious deformities Neuro:  normal strength, MAE x 4, A&O x 3, appropriate Skin: No rashes, warm and dry, no obvious skin lesions  Psych: normal mood and behavior   Assessment/Plan Lung mass with lymphadenopathy concerning for malignancy Current every day smoker Headaches since 10/24/2023 Plan We have reviewed your CT scan results . You have agreed to move forward with bronchoscopy and biopsy. I have placed an order for a bronchoscopy with biopsies.  We have discussed the procedure in  detail.  We have reviewed the risks and benefits of the procedure. These include bleeding, infection, puncture of the lung, and adverse reaction to anesthesia. You have agreed to proceed with biopsy to evaluate the left upper lobe nodule. Your procedure will be done by Dr. Lamar Chris. You will receive a letter today with date time and information pertaining to the procedure. You will need someone to drive you to the procedure, stay with you during the procedure, and stay with you after the procedure. You will also need someone to stay with you for 24 hours after anesthesia to ensure you have cleared and are doing well. You will follow-up with me 1 week after the procedure to review the results and to ensure you are doing well. Call if you need us  prior to the procedure or if you have any questions at all. Please contact office for sooner follow up if symptoms do not improve or worsen or seek emergency care . Continue Tylenol  for headache. MRI Brain scheduled for today. Stop Plavix  5 days before procedure. Stop Ibuprofen 48 hours prior.  Please work on quitting vaping. Please contact office for sooner follow up if symptoms do not improve or worsen or seek emergency care   I spent 60  minutes dedicated to the care of this patient on the date of this encounter to include pre-visit review of records, face-to-face time with the patient discussing conditions above, post visit ordering of testing, clinical documentation with the electronic health record, making appropriate referrals as documented, and communicating necessary information to the patient's healthcare team.   Lauraine JULIANNA Lites, NP 11/07/2023  9:57 AM

## 2023-11-11 ENCOUNTER — Telehealth: Payer: Self-pay

## 2023-11-11 NOTE — Telephone Encounter (Signed)
 Spoke with patient in regards to brain MRI results.  Per Cassie, PA, brain MRI shows no cancer in the brain.  Patient voiced understanding.

## 2023-11-12 ENCOUNTER — Ambulatory Visit (HOSPITAL_COMMUNITY)
Admission: RE | Admit: 2023-11-12 | Discharge: 2023-11-12 | Disposition: A | Discharge status: Home / Self Care | Source: Ambulatory Visit | Attending: Physician Assistant | Admitting: Physician Assistant

## 2023-11-12 DIAGNOSIS — R918 Other nonspecific abnormal finding of lung field: Secondary | ICD-10-CM | POA: Diagnosis present

## 2023-11-12 LAB — GLUCOSE, CAPILLARY: Glucose-Capillary: 103 mg/dL — ABNORMAL HIGH (ref 70–99)

## 2023-11-12 MED ORDER — FLUDEOXYGLUCOSE F - 18 (FDG) INJECTION
6.7800 | Freq: Once | INTRAVENOUS | Status: AC | PRN
  Administered 2023-11-12: 6.78 via INTRAVENOUS

## 2023-11-13 ENCOUNTER — Telehealth: Payer: Self-pay | Admitting: Physician Assistant

## 2023-11-13 ENCOUNTER — Other Ambulatory Visit: Payer: Self-pay | Admitting: Physician Assistant

## 2023-11-13 DIAGNOSIS — J90 Pleural effusion, not elsewhere classified: Secondary | ICD-10-CM

## 2023-11-13 NOTE — Telephone Encounter (Signed)
 I reviewed the patient's PET scan with Dr. Sherrod which is highly concerning. She is scheduled for biopsy on 7/28 and to see Dr. Sherrod on 7/29. She has moderate left pleural effusion. I am concerned she will not have biopsy results by Tuesday. I called the patient and spoke to her. She should keep her biopsy with Dr. Shelah as scheduled for 7/28 for definitive diagnosis but just to increase the odds of having results by 7/29, I called the patient to ask about a thoracentesis. She is agreeable to this. Reached out to IR who is going to call to schedule. Of note, she has been off plavix  for a few days. She told me she has been off it for a few days but for her biopsy on Monday she would have stopped yesterday on 7/22. IR will confirm tomorrow how many days she needs to be off the Plavix  prior to her procedure.

## 2023-11-14 ENCOUNTER — Ambulatory Visit (HOSPITAL_COMMUNITY)
Admission: RE | Admit: 2023-11-14 | Discharge: 2023-11-14 | Disposition: A | Discharge status: Home / Self Care | Source: Ambulatory Visit | Attending: Physician Assistant | Admitting: Physician Assistant

## 2023-11-14 ENCOUNTER — Other Ambulatory Visit: Payer: Self-pay | Admitting: Physician Assistant

## 2023-11-14 ENCOUNTER — Ambulatory Visit (HOSPITAL_COMMUNITY)
Admission: RE | Admit: 2023-11-14 | Discharge: 2023-11-14 | Disposition: A | Discharge status: Home / Self Care | Source: Ambulatory Visit | Attending: Urology | Admitting: Urology

## 2023-11-14 ENCOUNTER — Telehealth: Payer: Self-pay

## 2023-11-14 DIAGNOSIS — J90 Pleural effusion, not elsewhere classified: Secondary | ICD-10-CM | POA: Insufficient documentation

## 2023-11-14 DIAGNOSIS — R0781 Pleurodynia: Secondary | ICD-10-CM

## 2023-11-14 DIAGNOSIS — Z85118 Personal history of other malignant neoplasm of bronchus and lung: Secondary | ICD-10-CM | POA: Insufficient documentation

## 2023-11-14 MED ORDER — HYDROCODONE-ACETAMINOPHEN 5-325 MG PO TABS: 1.0000 | ORAL_TABLET | Freq: Four times a day (QID) | ORAL | 0 refills | Status: AC | PRN

## 2023-11-14 MED ORDER — LIDOCAINE-EPINEPHRINE (PF) 2 %-1:200000 IJ SOLN
INTRAMUSCULAR | Status: AC
  Filled 2023-11-14: qty 20

## 2023-11-14 NOTE — Telephone Encounter (Signed)
 Patient's daughter called and left a voicemail. Returned the call and spoke with the daughter. She reported that the patient underwent a thoracentesis earlier today, followed by a chest X-ray. Patient is experiencing pain when taking a deep breath or turning her body. She has taken Advil and gabapentin , which have provided some relief.  Per Cassie, PA, Norco will be called in if the pain does not improve. Daughter stated that the patient is currently resting and comfortable. Instructed daughter that if the patient wakes up in severe pain, she should be evaluated at an urgent care with imaging or the emergency department to rule out a post-procedure complication. Daughter voiced understanding.

## 2023-11-14 NOTE — Procedures (Addendum)
 PROCEDURE SUMMARY:  Successful image-guided left-sided diagnostic and therapeutic thoracentesis. Yielded 0.90 liters of clear, blood-tinged pleural fluid. Procedure was aborted at that volume due to patient experiencing chest pain. Patient tolerated procedure well. EBL: Zero No immediate complications.  Specimen was sent for labs. Post procedure CXR is pending.  Please see imaging section of Epic for full dictation.  Carlin DELENA Griffon PA-C 11/14/2023 12:29 PM

## 2023-11-15 NOTE — Progress Notes (Signed)
 Anesthesia Chart Review:  83 year old female with pertinent history including former smoker and current vaper, GERD on PPI, CAD s/p remote MI (20 years ago), hypothyroidism, Parkinson's.  Patient recently presented to the ED on 10/24/2023 for generalized weakness, wheezing, mild cough, and left-sided chest discomfort.  She had a chest x-ray that showed left-sided infiltrate with multifocal left lung consolidations (possibly infection/inflammation).  A CT scan of the chest was recommended but the patient refused and wanted to go home. She was prescribed antibiotics with Omnicef .  She then returned to the emergency room on 10/27/2023.  She had increased shortness of breath, generalized weakness, and headaches with associated nausea/vomiting.  Unfortunately her CT scan performed on 10/27/2023 showed concerns for left upper lobe lung malignancy with 9.5 x 4.5 cm left upper lobe posterior-inferior mass-like consolidation extending to the left hilar region and left lower lobe. The density encases the left pulmonary arteries as well as the left pulmonary proximal bronchials are narrowed due to mass effect.  Patient was recommended to remain inpatient to expedite workup, however, she preferred to continue evaluation as an outpatient.  She had follow-up with pulmonology on 11/07/2023 she was recommended to proceed with bronchoscopy and biopsy.  She was instructed to hold Plavix  5 days before procedure.  EKG 10/27/2023: Sinus rhythm.  Rate 67. Borderline right axis deviation. Nonspecific T abnrm, anterolateral leads  PET scan 11/12/2023: IMPRESSION: Extensive disease along left hemithorax with nodule poor thickening left hypermetabolic. Large central left upper lobe mass with bronchial occlusion and progressive hypermetabolic consolidation. Associated moderate left pleural effusion.   Extensive thoracic hypermetabolic metastatic disease including supraclavicular/thoracic inlet, paratracheal, prevascular, subcarinal,  left internal mammary, azygous esophageal recess and anterior cardiophrenic/pre cardiac spaces. There is also a solitary left axillary hypermetabolic node.   Solitary asymmetric focus of uptake along the spine at T3. A bone metastasis is possible.   No specific abnormal uptake beneath the diaphragm to suggest metastatic disease.   Mild asymmetric area of uptake along the cecum which is collapsed. This is likely a physiologic variant of distribution of uptake but with the asymmetry dedicated colonic evaluation could be considered to exclude lesion when clinically appropriate.   Slight increase in compression deformity of L3 vertebral body with increasing sclerosis. No abnormal uptake.   Mild ectasia of the inferior abdominal aorta of up to 2.9 cm.   Asymmetric uptake along the right vocal cord. Please correlate for asymmetric function.   CT chest 10/27/2023: IMPRESSION: 1. 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 Left upper lobe 9.5 x 4.5 cm left upper lobe posteroinferior masslike consolidation extending to the left hilar region and left lower lobe. This density in cases the left pulmonary arteries as well as the left pulmonary proximal bronchials are narrowed from external mass effect. Additional imaging evaluation or consultation with Pulmonology or Thoracic Surgery recommended. 2. Left lower lobe ground-glass patchy airspace opacities distal to the mass. Finding could represent direct invasion of the mass versus postobstructive consolidation. 3. No pulmonary embolus. No narrowing of the encased pulmonary arteries. 4.  Emphysema (ICD10-J43.9). 5. Aortic Atherosclerosis (ICD10-I70.0) -including four-vessel coronary calcification. 6. Nonspecific perigastric venous collaterals.     Lynwood Geofm [facility] Short Stay Center/Anesthesiology Phone [phone] 11/15/2023 3:15 PM

## 2023-11-15 NOTE — Anesthesia Preprocedure Evaluation (Signed)
 Anesthesia Evaluation    Airway        Dental   Pulmonary former smoker          Cardiovascular      Neuro/Psych    GI/Hepatic   Endo/Other    Renal/GU      Musculoskeletal   Abdominal   Peds  Hematology   Anesthesia Other Findings   Reproductive/Obstetrics                              Anesthesia Physical Anesthesia Plan  ASA:   Anesthesia Plan:    Post-op Pain Management:    Induction:   PONV Risk Score and Plan:   Airway Management Planned:   Additional Equipment:   Intra-op Plan:   Post-operative Plan:   Informed Consent:   Plan Discussed with:   Anesthesia Plan Comments: (PAT note by Lynwood Hope, PA-C: 83 year old female with pertinent history including former smoker and current vaper, GERD on PPI, CAD s/p remote MI (20 years ago), hypothyroidism, Parkinson's.  Patient recently presented to the ED on 10/24/2023 for generalized weakness, wheezing, mild cough, and left-sided chest discomfort.  She had a chest x-ray that showed left-sided infiltrate with multifocal left lung consolidations (possibly infection/inflammation).  A CT scan of the chest was recommended but the patient refused and wanted to go home. She was prescribed antibiotics with Omnicef .  She then returned to the emergency room on 10/27/2023.  She had increased shortness of breath, generalized weakness, and headaches with associated nausea/vomiting.  Unfortunately her CT scan performed on 10/27/2023 showed concerns for left upper lobe lung malignancy with 9.5 x 4.5 cm left upper lobe posterior-inferior mass-like consolidation extending to the left hilar region and left lower lobe. The density encases the left pulmonary arteries as well as the left pulmonary proximal bronchials are narrowed due to mass effect.  Patient was recommended to remain inpatient to expedite workup, however, she preferred to continue evaluation as an  outpatient.  She had follow-up with pulmonology on 11/07/2023 she was recommended to proceed with bronchoscopy and biopsy.  She was instructed to hold Plavix  5 days before procedure.  EKG 10/27/2023: Sinus rhythm.  Rate 67. Borderline right axis deviation. Nonspecific T abnrm, anterolateral leads  PET scan 11/12/2023: IMPRESSION: Extensive disease along left hemithorax with nodule poor thickening left hypermetabolic. Large central left upper lobe mass with bronchial occlusion and progressive hypermetabolic consolidation. Associated moderate left pleural effusion.   Extensive thoracic hypermetabolic metastatic disease including supraclavicular/thoracic inlet, paratracheal, prevascular, subcarinal, left internal mammary, azygous esophageal recess and anterior cardiophrenic/pre cardiac spaces. There is also a solitary left axillary hypermetabolic node.   Solitary asymmetric focus of uptake along the spine at T3. A bone metastasis is possible.   No specific abnormal uptake beneath the diaphragm to suggest metastatic disease.   Mild asymmetric area of uptake along the cecum which is collapsed. This is likely a physiologic variant of distribution of uptake but with the asymmetry dedicated colonic evaluation could be considered to exclude lesion when clinically appropriate.   Slight increase in compression deformity of L3 vertebral body with increasing sclerosis. No abnormal uptake.   Mild ectasia of the inferior abdominal aorta of up to 2.9 cm.   Asymmetric uptake along the right vocal cord. Please correlate for asymmetric function.   CT chest 10/27/2023: IMPRESSION: 1. 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 Left upper lobe 9.5 x 4.5 cm left upper lobe posteroinferior masslike  consolidation extending to the left hilar region and left lower lobe. This density in cases the left pulmonary arteries as well as the left pulmonary proximal bronchials  are narrowed from external mass effect. Additional imaging evaluation or consultation with Pulmonology or Thoracic Surgery recommended. 2. Left lower lobe ground-glass patchy airspace opacities distal to the mass. Finding could represent direct invasion of the mass versus postobstructive consolidation. 3. No pulmonary embolus. No narrowing of the encased pulmonary arteries. 4.  Emphysema (ICD10-J43.9). 5. Aortic Atherosclerosis (ICD10-I70.0) -including four-vessel coronary calcification. 6. Nonspecific perigastric venous collaterals.  )         Anesthesia Quick Evaluation

## 2023-11-15 NOTE — Progress Notes (Signed)
 Called patient to review pre op instructions for bronchoscopy on Monday July 28. Informed by patient's daughter, Greig Dance that patient wants to cancel the procedure. Spoke with patient who confirmed that she wants to cancel the procedure. Sent [employer] to Dr. Shelah. Pt's daughter said she will call his office Monday morning.

## 2023-11-18 ENCOUNTER — Encounter (HOSPITAL_COMMUNITY): Payer: Self-pay | Admitting: Physician Assistant

## 2023-11-18 ENCOUNTER — Ambulatory Visit (HOSPITAL_COMMUNITY): Admission: RE | Admit: 2023-11-18 | Source: Home / Self Care | Admitting: Emergency Medicine

## 2023-11-18 DIAGNOSIS — R918 Other nonspecific abnormal finding of lung field: Secondary | ICD-10-CM | POA: Insufficient documentation

## 2023-11-18 LAB — CYTOLOGY - NON PAP

## 2023-11-18 SURGERY — VIDEO BRONCHOSCOPY WITH ENDOBRONCHIAL NAVIGATION
Anesthesia: General | Laterality: Left

## 2023-11-19 ENCOUNTER — Other Ambulatory Visit: Payer: Self-pay | Admitting: Physician Assistant

## 2023-11-19 ENCOUNTER — Inpatient Hospital Stay

## 2023-11-19 ENCOUNTER — Inpatient Hospital Stay (HOSPITAL_BASED_OUTPATIENT_CLINIC_OR_DEPARTMENT_OTHER): Admitting: Internal Medicine

## 2023-11-19 VITALS — BP 130/78 | HR 91 | Temp 97.8°F | Resp 16 | Ht 63.0 in | Wt 138.0 lb

## 2023-11-19 DIAGNOSIS — R918 Other nonspecific abnormal finding of lung field: Secondary | ICD-10-CM | POA: Diagnosis not present

## 2023-11-19 DIAGNOSIS — C3412 Malignant neoplasm of upper lobe, left bronchus or lung: Secondary | ICD-10-CM | POA: Diagnosis not present

## 2023-11-19 LAB — CMP (CANCER CENTER ONLY)
ALT: 16 U/L (ref 0–44)
AST: 32 U/L (ref 15–41)
Albumin: 3.5 g/dL (ref 3.5–5.0)
Alkaline Phosphatase: 103 U/L (ref 38–126)
Anion gap: 4 — ABNORMAL LOW (ref 5–15)
BUN: 13 mg/dL (ref 8–23)
CO2: 31 mmol/L (ref 22–32)
Calcium: 9 mg/dL (ref 8.9–10.3)
Chloride: 105 mmol/L (ref 98–111)
Creatinine: 1.11 mg/dL — ABNORMAL HIGH (ref 0.44–1.00)
GFR, Estimated: 50 mL/min — ABNORMAL LOW (ref >60)
Glucose, Bld: 104 mg/dL — ABNORMAL HIGH (ref 70–99)
Potassium: 4.4 mmol/L (ref 3.5–5.1)
Sodium: 140 mmol/L (ref 135–145)
Total Bilirubin: 0.6 mg/dL (ref 0.0–1.2)
Total Protein: 6.7 g/dL (ref 6.5–8.1)

## 2023-11-19 LAB — CBC WITH DIFFERENTIAL (CANCER CENTER ONLY)
Abs Immature Granulocytes: 0.02 K/uL (ref 0.00–0.07)
Basophils Absolute: 0 K/uL (ref 0.0–0.1)
Basophils Relative: 1 %
Eosinophils Absolute: 0.2 K/uL (ref 0.0–0.5)
Eosinophils Relative: 4 %
HCT: 42.5 % (ref 36.0–46.0)
Hemoglobin: 13.9 g/dL (ref 12.0–15.0)
Immature Granulocytes: 0 %
Lymphocytes Relative: 25 %
Lymphs Abs: 1.4 K/uL (ref 0.7–4.0)
MCH: 32.8 pg (ref 26.0–34.0)
MCHC: 32.7 g/dL (ref 30.0–36.0)
MCV: 100.2 fL — ABNORMAL HIGH (ref 80.0–100.0)
Monocytes Absolute: 0.4 K/uL (ref 0.1–1.0)
Monocytes Relative: 7 %
Neutro Abs: 3.4 K/uL (ref 1.7–7.7)
Neutrophils Relative %: 63 %
Platelet Count: 232 K/uL (ref 150–400)
RBC: 4.24 MIL/uL (ref 3.87–5.11)
RDW: 14.9 % (ref 11.5–15.5)
WBC Count: 5.4 K/uL (ref 4.0–10.5)
nRBC: 0 % (ref 0.0–0.2)

## 2023-11-19 NOTE — Progress Notes (Signed)
 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  PRIOR THERAPY:  CURRENT THERAPY:  INTERVAL HISTORY: Laura Fritz 83 y.o. female returns to the clinic today for  Discussed the use of AI scribe software for clinical note transcription with the patient, who gave verbal consent to proceed.  History of Present Illness Laura Fritz is an 83 year old female with likely stage four lung cancer who presents with a large left upper lobe lung mass and associated symptoms. She is accompanied by her daughter, Amy. She is being referred to hospice care for further support.  She has a large mass in the left upper lobe of her lung, measuring approximately 9.8 cm. Initially, she thought she had pneumonia and visited the hospital twice before a CT scan revealed the mass. Further imaging, including a PET scan and MRI of the brain, was conducted. An ultrasound-guided thoracentesis was performed to remove fluid, which tested negative for malignant cells.  She experiences shortness of breath, particularly in the mornings, and pain in her chest. She experiences shortness of breath, particularly in the mornings, and pain in her chest, describing this as the first significant pain she has felt. She is not interested in pursuing chemotherapy or radiation and prefers to focus on pain management. She has been prescribed hydrocodone  for pain management.  Her medical history includes metastasis to the mediastinal, supraclavicular, and axillary lymph nodes, as well as a metastatic bone lesion in the lower spine.  She has a family history of cancer, as her mother  underwent cancer treatment. Socially, she is supported by a large family and has expressed a desire to manage her condition according to her wishes, focusing on comfort and quality of life. She has also mentioned considering the use of cannabis to help with appetite and nausea.  In the review of symptoms, she reports experiencing nausea and difficulty finding a comfortable position due to pain. No significant coughing, but acknowledges occasional discomfort.     MEDICAL HISTORY: 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HCC)    Parkinson disease (HCC)     ALLERGIES:  is allergic to tramadol, budesonide , levofloxacin, mirabegron, nsaids, sulfa antibiotics, amoxicillin, and nystatin.  MEDICATIONS:  Current Outpatient Medications  Medication Sig Dispense Refill   buPROPion  (WELLBUTRIN  XL) 300 MG 24 hr tablet Take 300 mg by mouth every morning.     cholecalciferol (VITAMIN D3) 25 MCG (1000 UNIT) tablet Take 1 tablet (1,000 Units total) by mouth daily.     clopidogrel  (PLAVIX ) 75 MG tablet Take 75 mg by mouth daily.     escitalopram  (LEXAPRO ) 10 MG tablet Take 10 mg by mouth daily.     gabapentin  (NEURONTIN ) 300 MG capsule Take 900 mg by mouth at bedtime.     HYDROcodone -acetaminophen  (NORCO/VICODIN) 5-325 MG tablet Take 1 tablet by mouth every 6 (six) hours as  needed for moderate pain (pain score 4-6). 10 tablet 0   levothyroxine  (SYNTHROID ) 25 MCG tablet Take 1 tablet (25 mcg total) by mouth daily at 6 (six) AM.     loperamide  (IMODIUM ) 2 MG capsule Take 2 mg by mouth 4 (four) times daily as needed for diarrhea or loose stools.     magnesium  oxide (MAG-OX) 400 (240 Mg) MG tablet Take 1 tablet (400 mg total) by mouth daily. 30 tablet 2   nitroGLYCERIN   (NITROSTAT ) 0.4 MG SL tablet Place 0.4 mg under the tongue every 5 (five) minutes as needed for chest pain.     omeprazole (PRILOSEC) 20 MG capsule Take 20 mg by mouth in the morning.     ondansetron  (ZOFRAN ) 4 MG tablet Take 1 tablet (4 mg total) by mouth every 6 (six) hours. 12 tablet 0   rosuvastatin  (CRESTOR ) 20 MG tablet Take 20 mg by mouth at bedtime.     traZODone  (DESYREL ) 50 MG tablet Take 50 mg by mouth at bedtime as needed for sleep. (Patient not taking: Reported on 11/07/2023)     vitamin E 1000 UNIT capsule Take 1,000 Units by mouth daily. (Patient not taking: Reported on 11/07/2023)     No current facility-administered medications for this visit.    SURGICAL HISTORY:  Past Surgical History:  Procedure Laterality Date   ABDOMINAL HYSTERECTOMY     ANTERIOR (CYSTOCELE) AND POSTERIOR REPAIR (RECTOCELE) WITH XENFORM GRAFT AND SACROSPINOUS FIXATION     REVERSE SHOULDER ARTHROPLASTY Right 03/31/2021   Procedure: REVERSE SHOULDER ARTHROPLASTY;  Surgeon: Cristy Bonner DASEN, MD;  Location: [facility];  Service: Orthopedics;  Laterality: Right;   TUBAL LIGATION      REVIEW OF SYSTEMS:  Constitutional: positive for anorexia, fatigue, and weight loss Eyes: negative Ears, nose, mouth, throat, and face: negative Respiratory: positive for cough, dyspnea on exertion, and pleurisy/chest pain Cardiovascular: negative Gastrointestinal: negative Genitourinary:negative Integument/breast: negative Hematologic/lymphatic: negative Musculoskeletal:positive for muscle weakness Neurological: negative Behavioral/Psych: negative Endocrine: negative Allergic/Immunologic: negative   PHYSICAL EXAMINATION: General appearance: alert, cooperative, fatigued, and no distress Head: Normocephalic, without obvious abnormality, atraumatic Neck: no adenopathy, no JVD, supple, symmetrical, trachea midline, and thyroid not enlarged, symmetric, no tenderness/mass/nodules Lymph nodes: Cervical, supraclavicular, and  axillary nodes normal. Resp: clear to auscultation bilaterally Back: symmetric, no curvature. ROM normal. No CVA tenderness. Cardio: regular rate and rhythm, S1, S2 normal, no murmur, click, rub or gallop GI: soft, non-tender; bowel sounds normal; no masses,  no organomegaly Extremities: extremities normal, atraumatic, no cyanosis or edema Neurologic: Alert and oriented X 3, normal strength and tone. Normal symmetric reflexes. Normal coordination and gait  ECOG PERFORMANCE STATUS: 1 - Symptomatic but completely ambulatory  Blood pressure 130/78, pulse 91, temperature 97.8 F (36.6 C), temperature source Temporal, resp. rate 16, height 5' 3 (1.6 m), weight 138 lb (62.6 kg), SpO2 93%.  LABORATORY DATA: Lab Results  Component Value Date   WBC 5.4 11/19/2023   HGB 13.9 11/19/2023   HCT 42.5 11/19/2023   MCV 100.2 (H) 11/19/2023   PLT 232 11/19/2023      Chemistry      Component Value Date/Time   NA 140 11/19/2023 1042   K 4.4 11/19/2023 1042   CL 105 11/19/2023 1042   CO2 31 11/19/2023 1042   BUN 13 11/19/2023 1042   CREATININE 1.11 (H) 11/19/2023 1042      Component Value Date/Time   CALCIUM  9.0 11/19/2023 1042   ALKPHOS 103 11/19/2023 1042   AST 32 11/19/2023 1042  ALT 16 11/19/2023 1042   BILITOT 0.6 11/19/2023 1042       RADIOGRAPHIC STUDIES: DG Chest 1 View Result Date: 11/14/2023 CLINICAL DATA:  Status post thoracentesis. EXAM: CHEST  1 VIEW COMPARISON:  10/24/2023.  Chest CT dated 10/27/2023. FINDINGS: Normal-sized heart. Again demonstrated is mass-like consolidation in the left upper lobe and left perihilar region. No visible pleural fluid. Left skin fold without pneumothorax. Clear right lung. Right shoulder prosthesis. Diffuse osteopenia. Thoracic and cervical spine degenerative changes. IMPRESSION: 1. No pneumothorax. 2. No visible pleural fluid. 3. Mass-like consolidation in the left upper lobe and left perihilar region, as seen on the recent chest CT.  Electronically Signed   By: Elspeth Bathe M.D.   On: 11/14/2023 12:58   US  THORACENTESIS ASP PLEURAL SPACE W/IMG GUIDE Result Date: 11/14/2023 INDICATION: 83 year old female with history of lung cancer, with recurrent left pleural effusion. IR was requested for diagnostic and therapeutic left-sided thoracentesis. EXAM: ULTRASOUND GUIDED DIAGNOSTIC AND THERAPEUTIC LEFT-SIDED THORACENTESIS MEDICATIONS: 6 cc of 1% lidocaine  COMPLICATIONS: None immediate. PROCEDURE: An ultrasound guided thoracentesis was thoroughly discussed with the patient and questions answered. The benefits, risks, alternatives and complications were also discussed. The patient understands and wishes to proceed with the procedure. Written consent was obtained. Ultrasound was performed to localize and mark an adequate pocket of fluid in the left chest. The area was then prepped and draped in the normal sterile fashion. 1% Lidocaine  was used for local anesthesia. Under ultrasound guidance a 6 Fr Safe-T-Centesis catheter was introduced. Thoracentesis was performed. The catheter was removed and a dressing applied. FINDINGS: A total of approximately 900 mL of clear, blood-tinged pleural fluid was removed. Samples were sent to the laboratory as requested by the clinical team. IMPRESSION: Successful ultrasound guided left thoracentesis yielding 0.90 L of pleural fluid. Procedure performed by Carlin Griffon, PA-C Electronically Signed   By: Wilkie Lent M.D.   On: 11/14/2023 12:57   NM PET Image Initial (PI) Skull Base To Thigh (F-18 FDG) Result Date: 11/13/2023 CLINICAL DATA:  Initial treatment strategy for lung cancer staging. EXAM: NUCLEAR MEDICINE PET SKULL BASE TO THIGH TECHNIQUE: 6.78 mCi F-18 FDG was injected intravenously. Full-ring PET imaging was performed from the skull base to thigh after the radiotracer. CT data was obtained and used for attenuation correction and anatomic localization. Fasting blood glucose: 103 mg/dl COMPARISON:  CT  chest with contrast 10/27/2023. Head and spine CT scans 04/28/2023. FINDINGS: Mediastinal blood pool activity: SUV max 2.3 Liver activity: SUV max 3.2 NECK: Hypermetabolic supraclavicular nodes identified bilaterally. Slightly larger on the right than left. Example on the right side has maximum SUV of 17.4 and measures 2.5 by 1.6 cm on series 4, image 44. Left-sided focus on image 43 of the same series measures 11 by 11 mm and has maximum SUV of 12.4. Mild asymmetric uptake along the right vocal cord, nonspecific. Otherwise no specific abnormal uptake seen in the neck including along lymph node change of the submandibular, posterior triangle or internal jugular region. Near symmetric uptake along the visualized intracranial compartment. Incidental CT findings: Paranasal sinuses and mastoid air cells are clear. The parotid glands, submandibular glands and thyroid gland are grossly preserved. Scattered vascular calcifications in the neck. CHEST: Moderate left pleural effusion. Nodular areas of pleural thickening are seen diffusely along the left hemithorax. Examples posteriorly on the left side the level of the carina has maximum SUV value of 17.0. Thickness approaches 13 mm on series 4, image 72. In addition there is confluent opacity  lung left upper lobe with bronchial occlusion and narrowing with patchy radiotracer uptake of this consolidative masslike area approaching 13.7 maximum SUV. These changes are progressive from the previous examination with more opacity and consolidation. Some of this could be collapse along associated with the central bronchial occlusion. There are several hypermetabolic lymph nodes identified in the mediastinum. Distribution includes prevascular, left internal mammary, right paratracheal, precarinal, subcarinal and as ago esophageal recess as well as anterior cardiophrenic. Example right precarinal just above the level of the carina has maximum SUV of 19.1. Lesion measures 2.7 x 2.4 cm  on series 4, image 66. subcarinal lesion measures maximum SUV of 17.0 and dimension on image 72 measures 2.3 x 1.6 cm. Pre cardiac node measures 6 by 14 mm on series 4, image 84 and has maximum SUV of 8.0. In addition there is a left axillary hypermetabolic node which has maximum SUV value of 6.9 and on image 59 of the CT scan measures 11 by 6 mm. No abnormal uptake above blood pool in the right axillary region, right lung hilum. No areas of abnormal uptake in the right lung. No right-sided pleural effusion. Incidental CT findings: Heart nonenlarged. Slight pericardial thickening or fluid. Coronary artery calcifications are seen. The thoracic aorta is normal course and caliber. Breathing motion. Emphysematous lung changes. ABDOMEN/PELVIS: No abnormal hypermetabolic activity within the liver, pancreas, adrenal glands, or spleen. No hypermetabolic lymph nodes in the abdomen or pelvis. Only exception is a small asymmetric area of uptake along the cecum, more than other areas of bowel uptake with maximum SUV of 10.1. This loop of bowel is collapsed. Incidental CT findings: On the limited noncontrast CT, grossly the liver, spleen, adrenal glands and pancreas are unremarkable. Gallbladder is present. No abnormal calcifications seen within either kidney nor along the course of either ureter. Preserved contour to the urinary bladder. Diffuse vascular calcifications identified. There is some ectasia of the inferior abdominal aorta measuring up to 2.9 cm. Stomach and small bowel are nondilated. Normal appendix. SKELETON: There is an area of increased uptake seen with maximum SUV of 4.7 corresponding to the posterior aspect of the vertebral body at T3. Incidental CT findings: Curvature of the spine. Scattered degenerative changes. Streak artifact related to patient's right shoulder arthroplasty. Slight compression deformities at L3, L1. The margins are somewhat sclerotic. The level of compression at L3 slightly increased from  the CT of January 2025 and is more sclerosis. Again no abnormal uptake. IMPRESSION: Extensive disease along left hemithorax with nodule poor thickening left hypermetabolic. Large central left upper lobe mass with bronchial occlusion and progressive hypermetabolic consolidation. Associated moderate left pleural effusion. Extensive thoracic hypermetabolic metastatic disease including supraclavicular/thoracic inlet, paratracheal, prevascular, subcarinal, left internal mammary, azygous esophageal recess and anterior cardiophrenic/pre cardiac spaces. There is also a solitary left axillary hypermetabolic node. Solitary asymmetric focus of uptake along the spine at T3. A bone metastasis is possible. No specific abnormal uptake beneath the diaphragm to suggest metastatic disease. Mild asymmetric area of uptake along the cecum which is collapsed. This is likely a physiologic variant of distribution of uptake but with the asymmetry dedicated colonic evaluation could be considered to exclude lesion when clinically appropriate. Slight increase in compression deformity of L3 vertebral body with increasing sclerosis. No abnormal uptake. Mild ectasia of the inferior abdominal aorta of up to 2.9 cm. Asymmetric uptake along the right vocal cord. Please correlate for asymmetric function. Electronically Signed   By: Ranell Bring M.D.   On: 11/13/2023 15:22  MR Brain W Wo Contrast Result Date: 11/07/2023 CLINICAL DATA:  Metastatic disease evaluation Staging of lung cancer, no tissue diagnosis. Patient also has headaches. EXAM: MRI HEAD WITHOUT AND WITH CONTRAST TECHNIQUE: Multiplanar, multiecho pulse sequences of the brain and surrounding structures were obtained without and with intravenous contrast. CONTRAST:  6mL GADAVIST  GADOBUTROL  1 MMOL/ML IV SOLN COMPARISON:  CT the head dated April 28, 2023. FINDINGS: Brain: There is age related cerebral volume loss present and there is extensive periventricular and deep cerebral white  matter disease. There is no evidence of hemorrhage, mass, cortical infarct or hydrocephalus. There is no abnormal parenchymal or meningeal enhancement present. Vascular: Normal flow voids. Skull and upper cervical spine: Normal marrow signal. No osseous lesions are evident. Sinuses/Orbits: Clear paranasal sinuses. Status post bilateral lens replacement. Other: None. IMPRESSION: 1. No evidence of metastatic disease. 2. Age-related atrophy and advanced diffuse cerebral white matter disease. Electronically Signed   By: Evalene Coho M.D.   On: 11/07/2023 19:38   CT Chest W Contrast Result Date: 10/27/2023 CLINICAL DATA:  Pneumonia, complication suspected, xray done EXAM: CT CHEST WITH CONTRAST TECHNIQUE: Multidetector CT imaging of the chest was performed during intravenous contrast administration. RADIATION DOSE REDUCTION: This exam was performed according to the departmental dose-optimization program which includes automated exposure control, adjustment of the mA and/or kV according to patient size and/or use of iterative reconstruction technique. CONTRAST:  75mL OMNIPAQUE  IOHEXOL  350 MG/ML SOLN COMPARISON:  Chest x-ray 10/24/2023, CT chest 04/16/2021 FINDINGS: Cardiovascular: Normal heart size. No significant pericardial effusion. The thoracic aorta is normal in caliber. Severe atherosclerotic plaque of the thoracic aorta. Four-vessel coronary artery calcifications. The main pulmonary artery is normal in caliber. No pulmonary embolus. No narrowing of the encased pulmonary arteries. Mediastinum/Nodes: Conglomerative left hilar lymphadenopathy difficult to measure measuring up to 4.5 x 3 cm on axial imaging (3:61). Mediastinal lymphadenopathy as well as right hilar lymphadenopathy with as an example a 1 cm lymph node on the right (3:73) and a precarinal 1.7 cm lymph node (3:60). No axillary lymphadenopathy. Thyroid gland, trachea, and esophagus demonstrate no significant findings. Lungs/Pleura: Moderate  emphysematous changes. Left upper lobe posterior inferior masslike consolidation extending to the left hilar region and into the left lower lobe measuring up to 9.5 x 4.5 cm. Density encases the left pulmonary arteries as well as left pulmonary proximal bronchials that appear to be narrowed from external mass effect. Left lower lobe ground-glass patchy airspace opacities distal to the mass. Trace to small volume left pleural effusion with associated multiple enhancing nodular pleural densities (3:61). No pneumothorax. Upper Abdomen: Perigastric venous collaterals. Subcentimeter left hepatic lobe hypodensity (3:138). Musculoskeletal: No chest wall abnormality. No suspicious lytic or blastic osseous lesions. No cortical erosion or destruction-specifically of the ribs adjacent to the pleural thickening. No acute displaced fracture. L1 chronic anterior wedge compression fracture. Multilevel degenerative changes of the spine. Total right shoulder arthroplasty. IMPRESSION: 1. 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 Left upper lobe 9.5 x 4.5 cm left upper lobe posteroinferior masslike consolidation extending to the left hilar region and left lower lobe. This density in cases the left pulmonary arteries as well as the left pulmonary proximal bronchials are narrowed from external mass effect. Additional imaging evaluation or consultation with Pulmonology or Thoracic Surgery recommended. 2. Left lower lobe ground-glass patchy airspace opacities distal to the mass. Finding could represent direct invasion of the mass versus postobstructive consolidation. 3. No pulmonary embolus. No narrowing of the encased pulmonary arteries. 4.  Emphysema (  ICD10-J43.9). 5. Aortic Atherosclerosis (ICD10-I70.0) -including four-vessel coronary calcification. 6. Nonspecific perigastric venous collaterals. Electronically Signed   By: Morgane  Naveau M.D.   On: 10/27/2023 18:30   DG Chest 2 View Result Date:  10/24/2023 CLINICAL DATA:  Chest pain EXAM: CHEST - 2 VIEW COMPARISON:  Chest radiograph April 28, 2023 FINDINGS: The heart size is borderline normal. Aortic knob is calcified. There is patchy areas of consolidation involving the left upper lobe and posterior aspects of left lower lobe, new to prior. Trace left-sided pleural effusion along the major fissure and blunting of left costophrenic angle. Status post reverse right shoulder arthroplasty. IMPRESSION: Suggestion of multifocal left lung consolidations (possibly infection/inflammation). Recommend chest CT for further assessment. Trace left pleural effusion. Electronically Signed   By: Megan  Zare M.D.   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 Metastases are present in the mediastinal, supraclavicular, and axillary lymph nodes, as well as in the lower spine. The cancer is aggressive, and the prognosis is less than three months. She is not interested in pursuing chemotherapy or radiation therapy due to her age and personal preferences. She prefers to focus on comfort and quality of life. - Refer to hospice for palliative care and symptom management.  Malignant pleural effusion, left Left-sided malignant pleural effusion was previously drained, but the fluid is expected to recur. The effusion is not the primary cause of her symptoms, given the large size of the lung mass.  Neoplasm-related pain Experiencing pain likely related to the cancer and its metastases. Pain management is a priority for her comfort. - Continue hydrocodone  for pain management. - Coordinate with hospice for ongoing pain management.  Dyspnea Experiencing shortness of breath, likely due to the large lung mass and pleural effusion. The mass is causing partial lung collapse,  contributing to respiratory symptoms. - Coordinate with hospice for management of dyspnea.  Nausea Experiencing nausea, which may be related to the cancer or its treatment. - Coordinate with hospice for management of nausea.  Goals of Care She has expressed a desire to focus on comfort and quality of life rather than aggressive treatment. She is ready to accept the prognosis and is not seeking to prolong life at the expense of quality. She is aware of the prognosis and has a large family with whom she wishes to spend her remaining time. She is not interested in life-prolonging treatments and prefers hospice care to manage symptoms. - Respect her wishes for comfort-focused care. - Ensure hospice is aware of her goals of care. I will see the patient on as-needed basis at this point.  She was advised to call if we can help in any way The patient voices understanding of current disease status and treatment options and is in agreement with the current care plan.  All questions were answered. The patient knows to call the clinic with any problems, questions or concerns. We can certainly see the patient much sooner if necessary.  The total time spent in the appointment was 55 minutes including review of chart and various tests results, discussions about plan of care and coordination of care plan .  Disclaimer: This note was dictated with voice recognition software. Similar sounding words can inadvertently be transcribed and may not be corrected upon review.

## 2023-11-27 ENCOUNTER — Ambulatory Visit: Admitting: Acute Care

## 2023-11-27 ENCOUNTER — Encounter: Payer: Self-pay | Admitting: Acute Care

## 2023-11-27 ENCOUNTER — Telehealth: Payer: Self-pay | Admitting: Acute Care

## 2023-11-27 NOTE — Telephone Encounter (Signed)
 Pt. Was a no show for her follow up bronch. She did not undergo the procedure . She had a thoracentesis which was negative for malignant cells.She has chosen to be referred  to  Hospice and Palliative care.   - No malignant cells identified in pleural fluid - Benign/reactive mesothelial cells and chronic inflammatory cells  within a serosanguineous background

## 2023-12-14 IMAGING — CT CT CERVICAL SPINE W/O CM
3 of 4 series · 11 of 33 positions shown, 13 images · non-contrast
Comparison: 03/26/2021

CLINICAL DATA: Fall from standing.  Patient on Plavix

EXAM:
CT HEAD WITHOUT CONTRAST
CT CERVICAL SPINE WITHOUT CONTRAST
TECHNIQUE: Multidetector CT imaging of the head and cervical spine was
performed following the standard protocol without intravenous
contrast. Multiplanar CT image reconstructions of the cervical spine
were also generated.

[Series 5: cor bone · coronal · 0.29mm/px · 3 of 56 slices shown]
[im 14/56  bone]
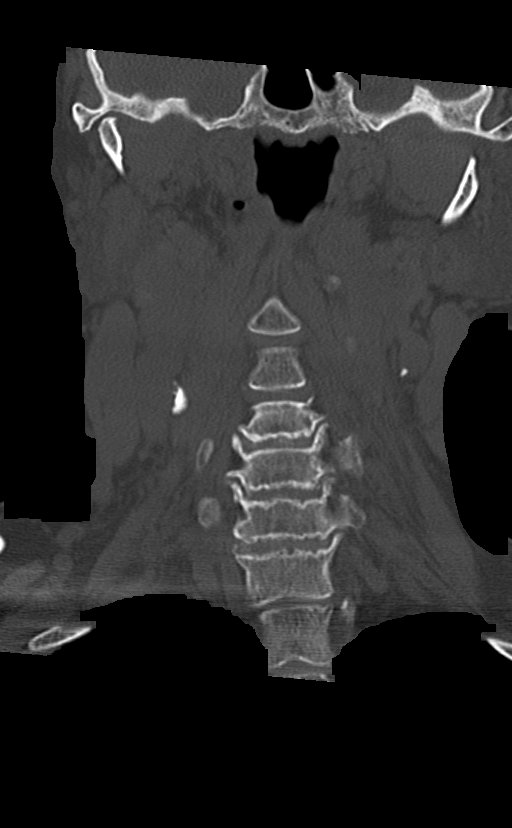
[im 23/56  bone]
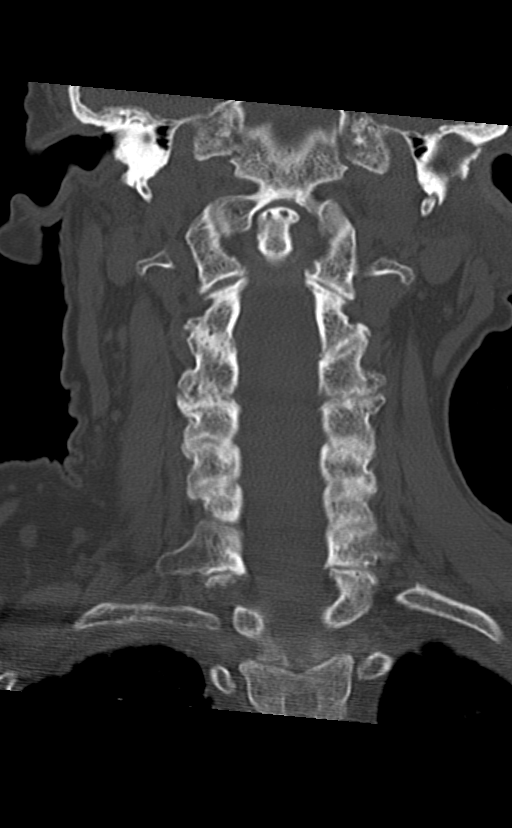
[im 33/56  bone]
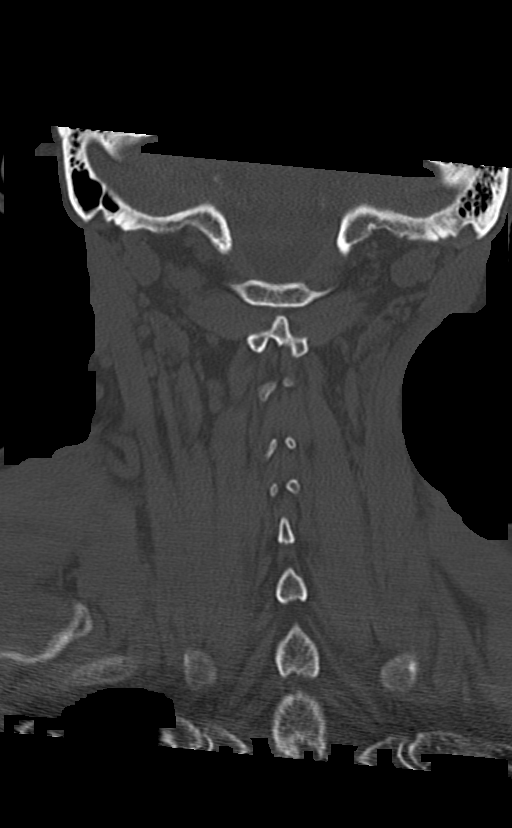

[Series 6: sag bone · sagittal · 0.27mm/px · 5 of 61 slices shown, 6 images]
[im 21/61  bone]
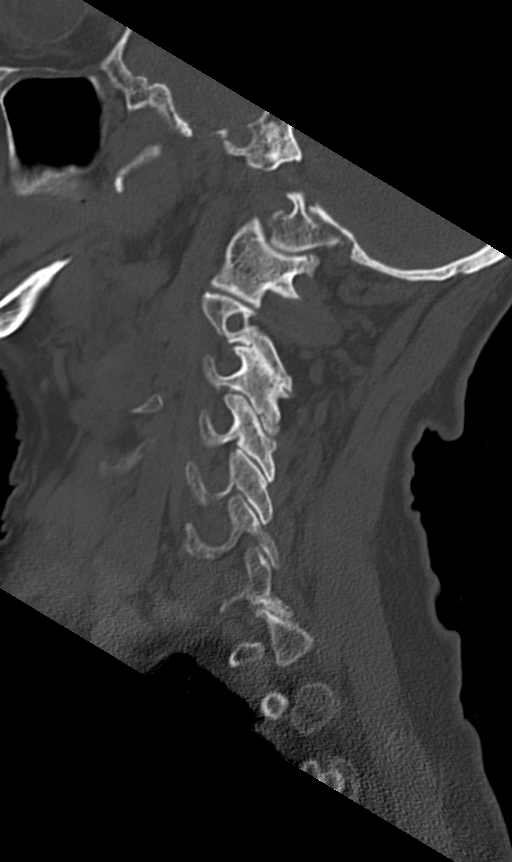
[im 26/61  bone]
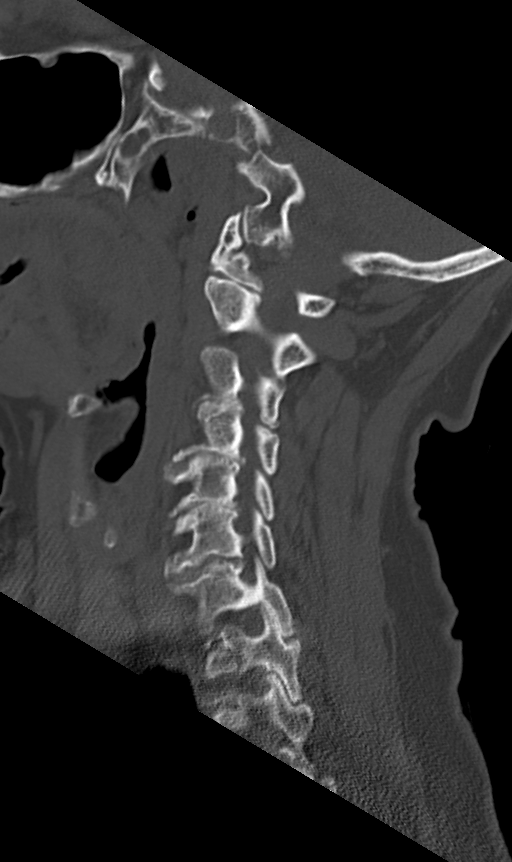
[im 31/61  soft-tissue]
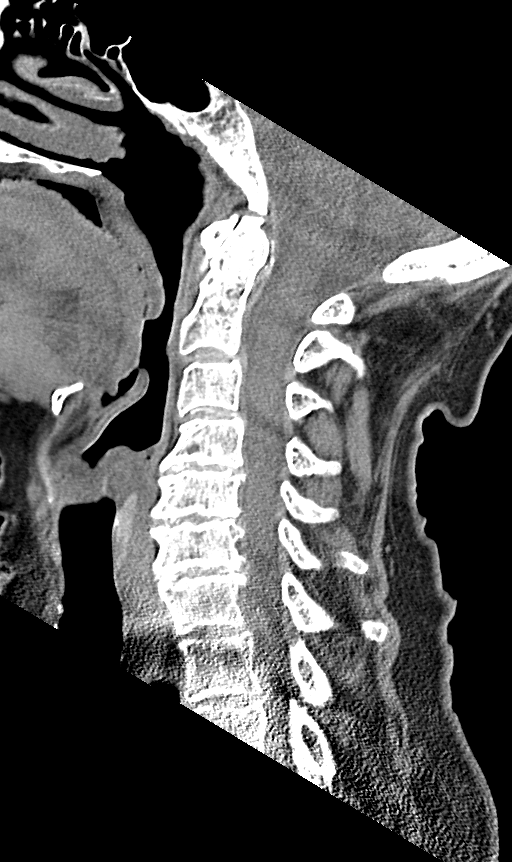
[im 31/61  bone]
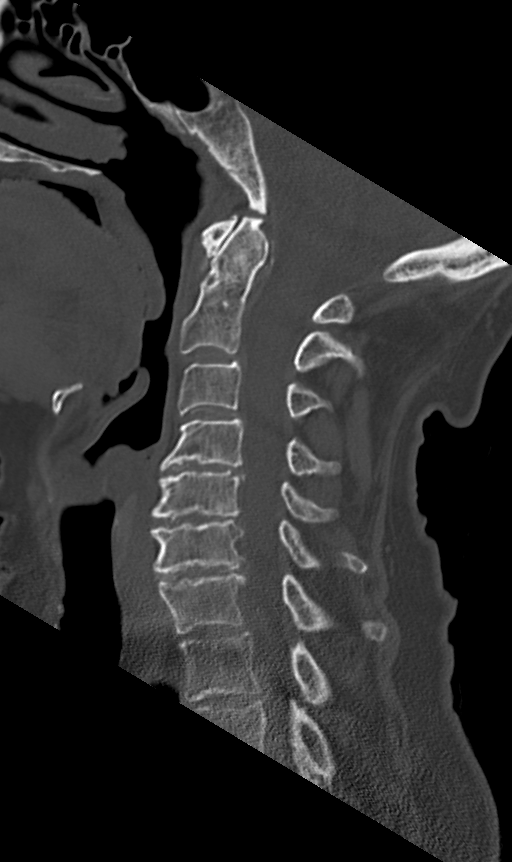
[im 36/61  bone]
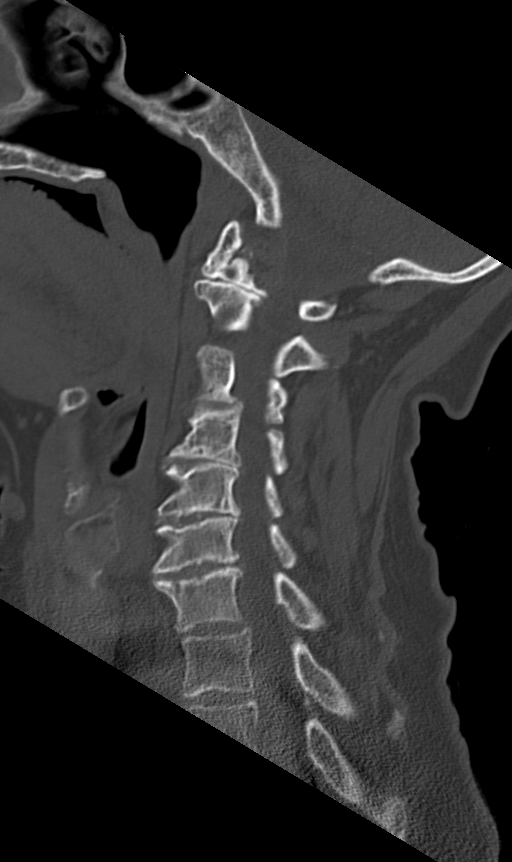
[im 41/61  bone]
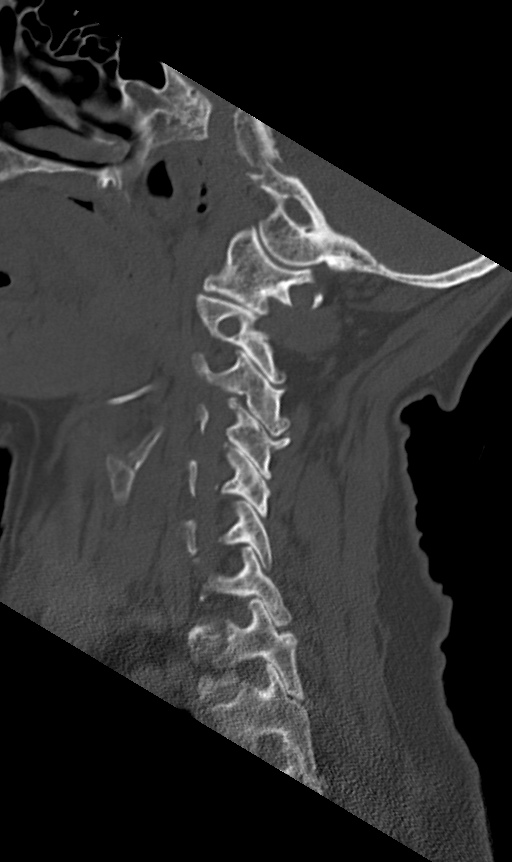

[Series 7: orthogonal axials · axial · 0.21mm/px · z∈[-302,-215]mm · 3 of 80 slices shown, 4 images]
[im 14/80  soft-tissue]
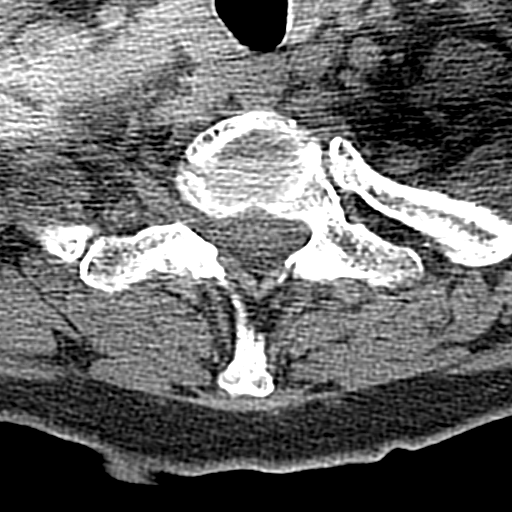
[im 14/80  bone]
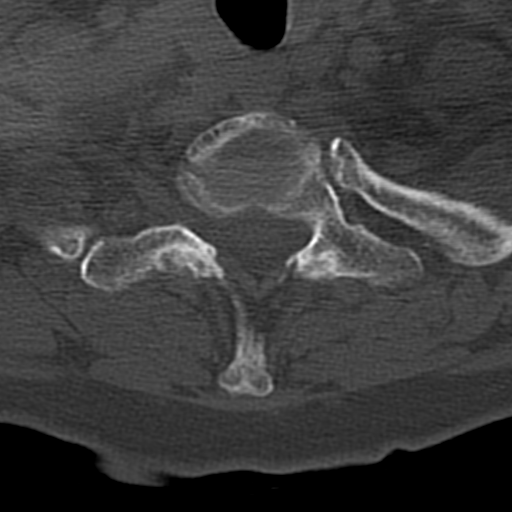
[im 40/80  bone]
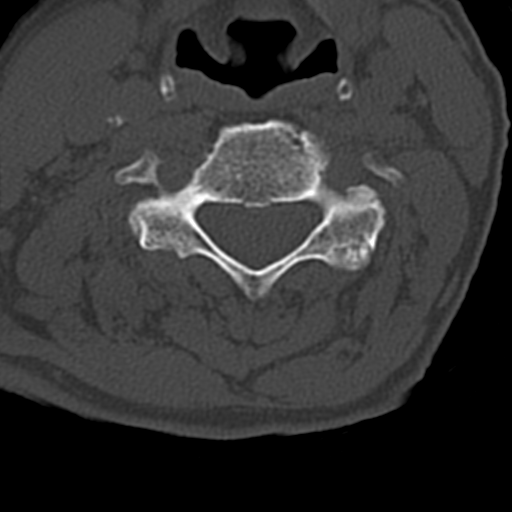
[im 66/80  bone]
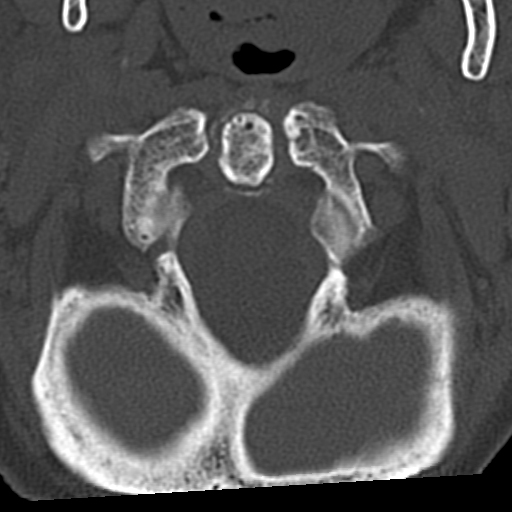

[11 of 33 positions shown; findings below may reference images not displayed]

FINDINGS: CT HEAD FINDINGS

Brain: No evidence of acute infarction, hemorrhage, hydrocephalus,
extra-axial collection or mass lesion/mass effect. Moderate
low-density changes within the periventricular and subcortical white
matter compatible with chronic microvascular ischemic change. Mild
diffuse cerebral volume loss.

Vascular: No hyperdense vessel or unexpected calcification.

Skull: Normal. Negative for fracture or focal lesion.

Sinuses/Orbits: No acute finding.

Other: Negative for scalp hematoma.

CT CERVICAL SPINE FINDINGS

Alignment: Facet joints are aligned without dislocation or traumatic
listhesis. Dens and lateral masses are aligned. Straightening of the
cervical lordosis without significant listhesis.

Skull base and vertebrae: No acute fracture. No primary bone lesion
or focal pathologic process.

Soft tissues and spinal canal: No prevertebral fluid or swelling. No
visible canal hematoma.

Disc levels: Moderate multilevel cervical spondylosis, without
interval progression from prior.

Upper chest: Emphysematous changes within the visualized lung
apices.

Other: None.
IMPRESSION: 1. No acute intracranial findings.
2. No evidence of acute fracture or traumatic listhesis of the
cervical spine.
3. Moderate multilevel cervical spondylosis.

## 2023-12-14 IMAGING — CT CT CHEST W/O CM
2 of 4 series · 15 of 36 positions shown, 18 images · non-contrast
Comparison: None; correlation CT abdomen and pelvis 04/01/2020

CLINICAL DATA: Fell from standing position this morning at 0300
hours, on Plavix, RIGHT arm pain, blunt chest trauma

EXAM:
CT CHEST WITHOUT CONTRAST
TECHNIQUE: Multidetector CT imaging of the chest was performed following the
standard protocol without IV contrast.

[Series 2: routine chest without · axial · non-contrast · 0.62mm/px · z∈[+924,+1190]mm · 12 of 159 slices shown, 15 images]
[im 13/159  mediastinal]
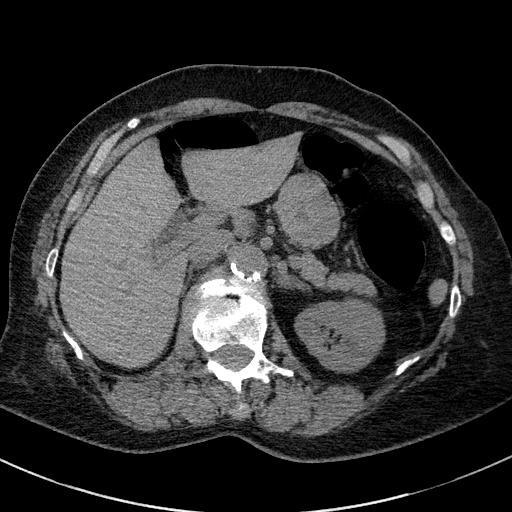
[im 13/159  lung]
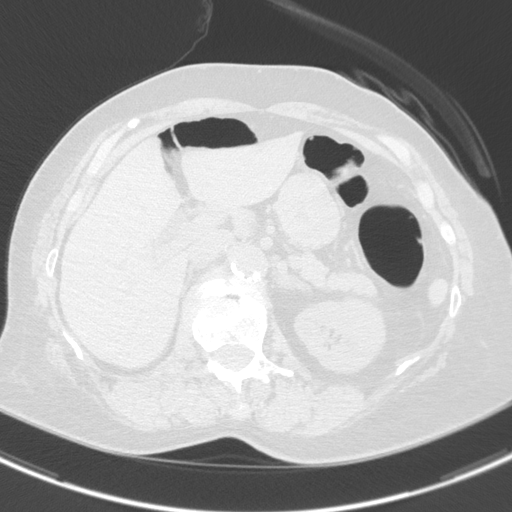
[im 25/159  lung]
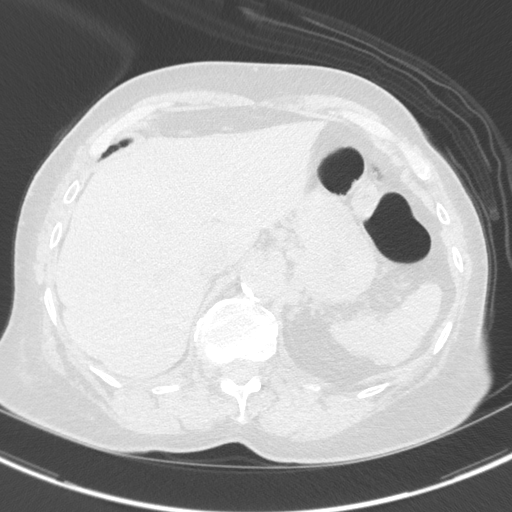
[im 37/159  lung]
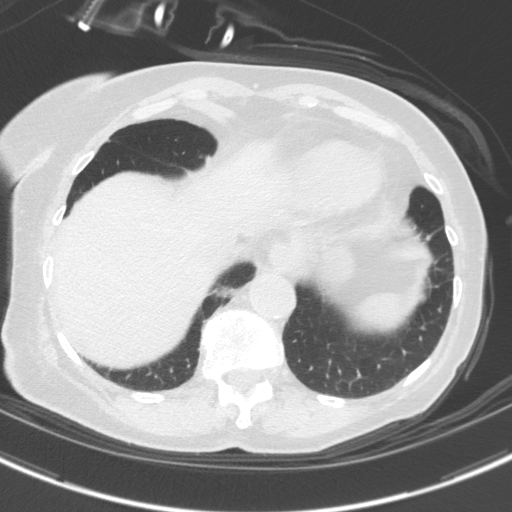
[im 49/159  lung]
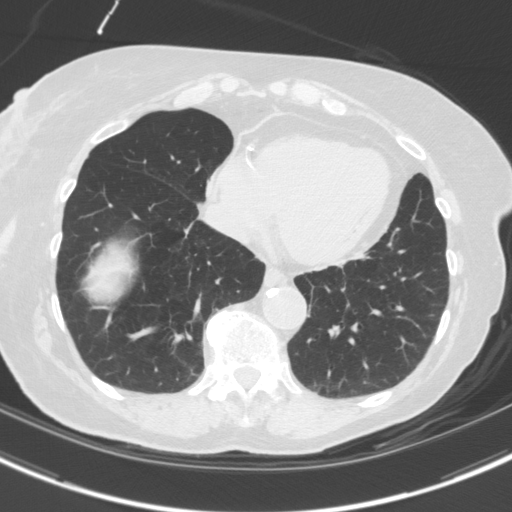
[im 61/159  mediastinal]
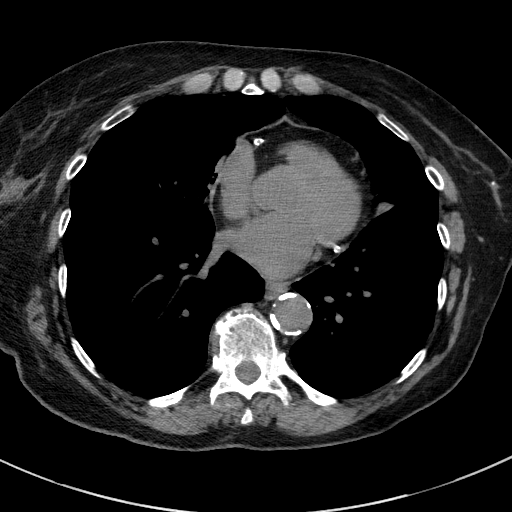
[im 61/159  lung]
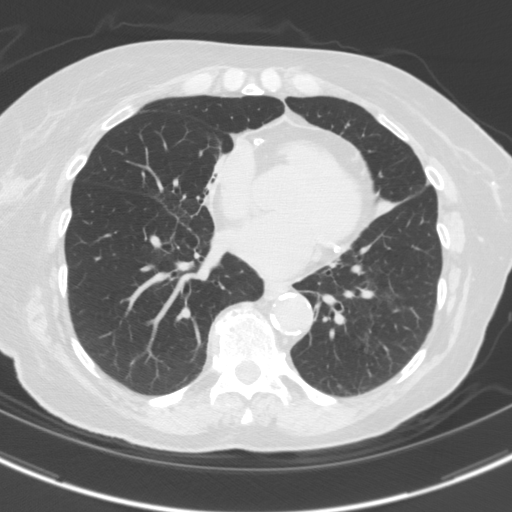
[im 73/159  lung]
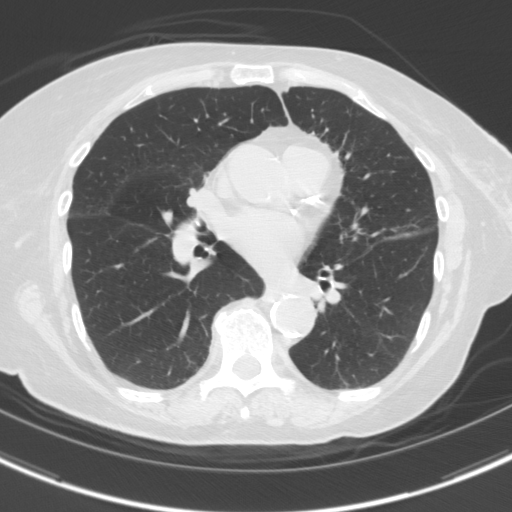
[im 86/159  lung]
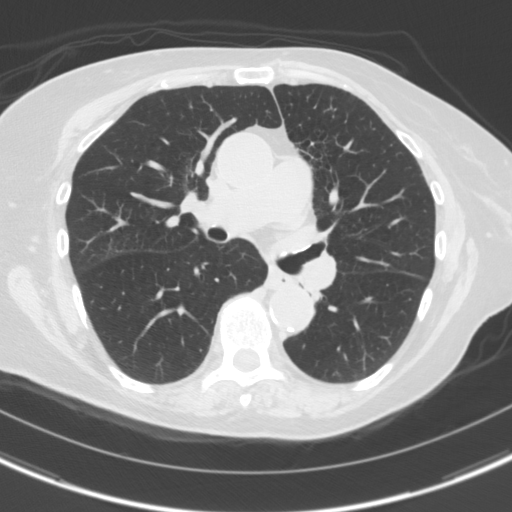
[im 98/159  lung]
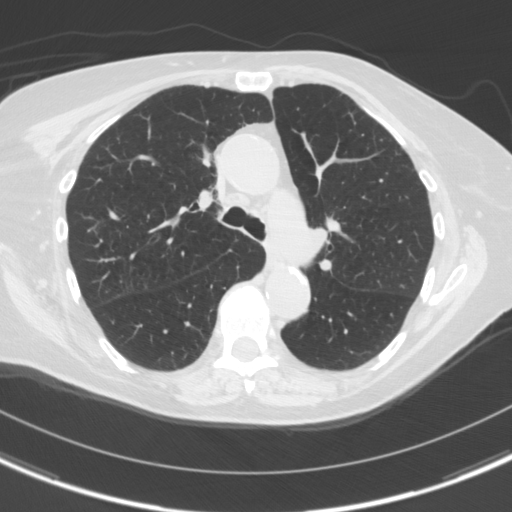
[im 110/159  mediastinal]
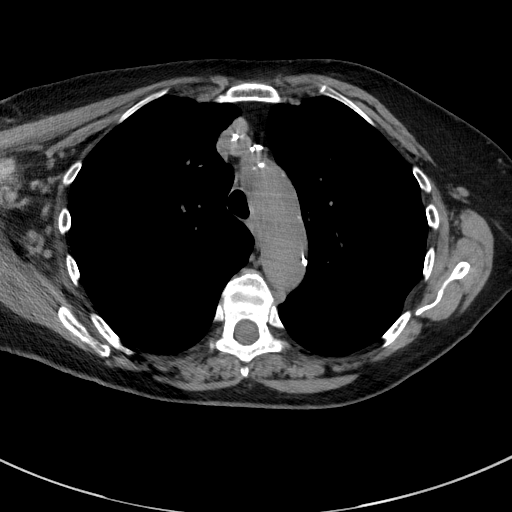
[im 110/159  lung]
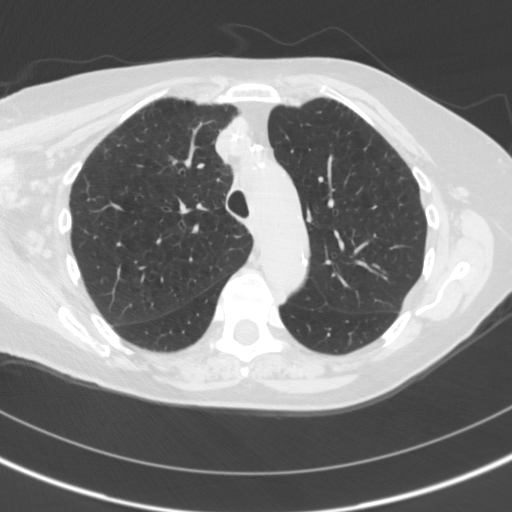
[im 122/159  lung]
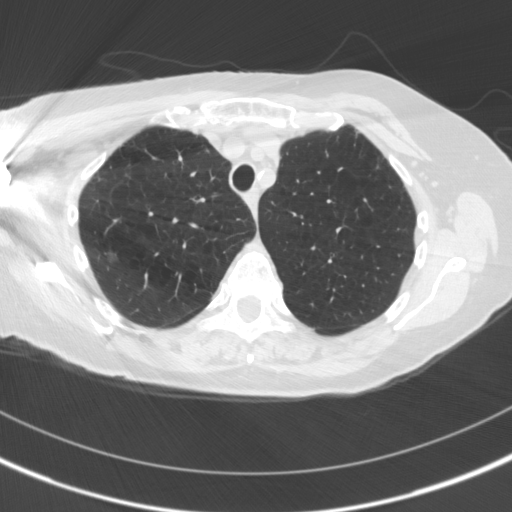
[im 134/159  lung]
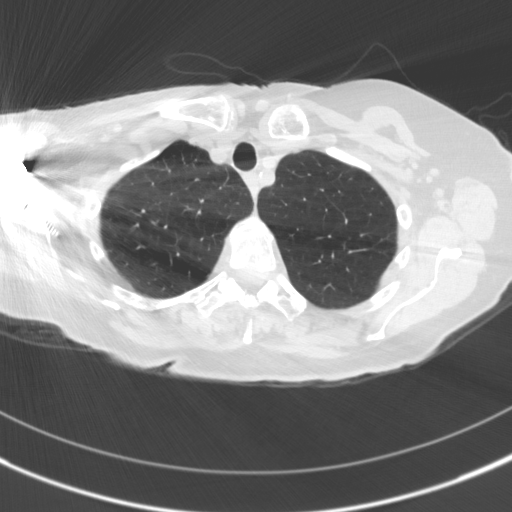
[im 146/159  lung]
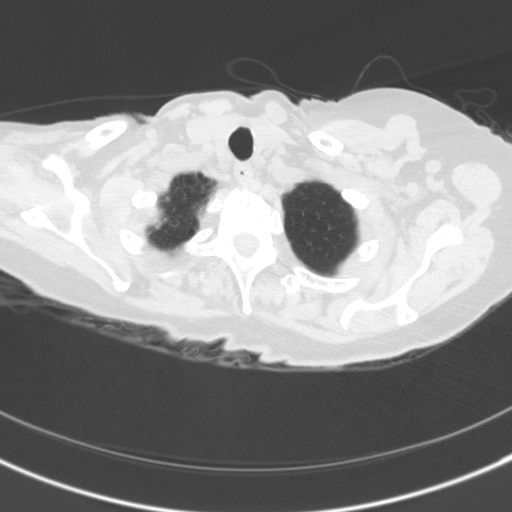

[Series 5: coronal · coronal · 0.69mm/px · 3 of 131 slices shown]
[im 27/131  lung]
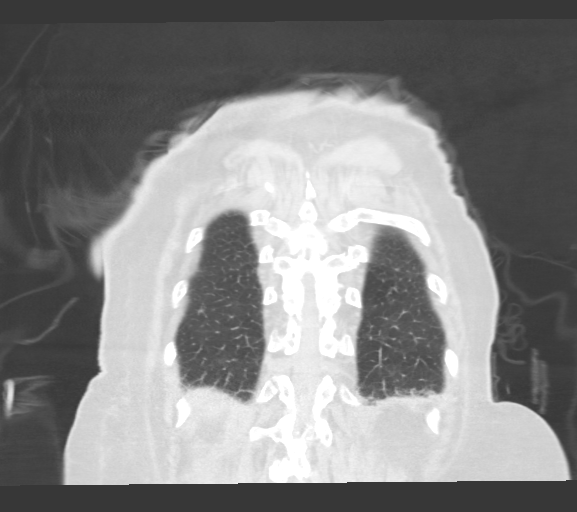
[im 53/131  lung]
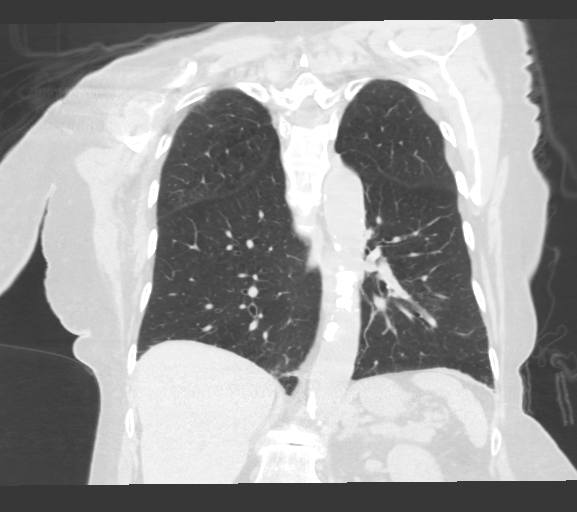
[im 79/131  lung]
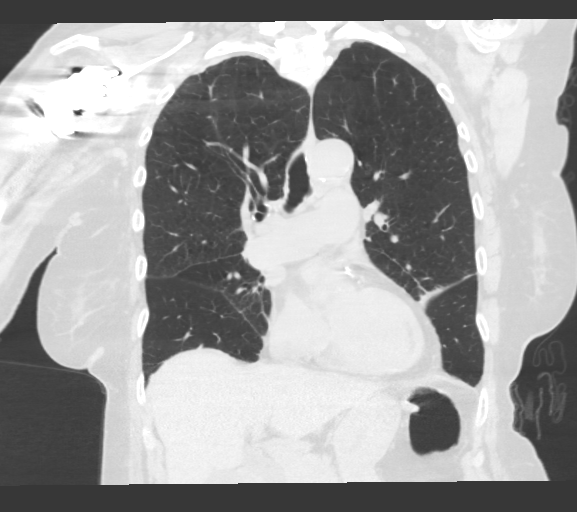

[15 of 36 positions shown; findings below may reference images not displayed]

FINDINGS: Cardiovascular: Atherosclerotic calcifications aorta, proximal great
vessels, and coronary arteries. Aorta normal caliber. Heart size
grossly normal. Small amount of fat within the LEFT ventricular
wall. No pericardial effusion.

Mediastinum/Nodes: Esophagus unremarkable. Base of cervical region
normal appearance. Upper normal sized precarinal lymph node 10 mm.
No thoracic adenopathy.

Lungs/Pleura: Severe centrilobular emphysematous changes. 5 mm
nonspecific ground-glass opacity RIGHT upper lobe image 38. No
additional infiltrate, pleural effusion, pneumothorax or mass.

Upper Abdomen: Calcified granulomata and tiny cyst within liver.
Remaining visualized upper abdomen unremarkable.

Musculoskeletal: Osseous demineralization. RIGHT shoulder
prosthesis. No acute osseous abnormalities. Chronic superior
endplate compression deformity of L1 unchanged.
IMPRESSION: No acute intrathoracic abnormalities.

5 mm nonspecific ground-glass opacity RIGHT upper lobe; no routine
follow-up imaging is recommended per [HOSPITAL] Guidelines.

These guidelines do not apply to immunocompromised patients and
patients with cancer. Follow up in patients with significant
comorbidities as clinically warranted. For lung cancer screening,
adhere to Lung-RADS guidelines. Reference: Radiology. 6975;

Aortic Atherosclerosis (SNDG1-4QV.V) and Emphysema (SNDG1-02W.I).

## 2023-12-14 IMAGING — DX DG ELBOW COMPLETE 3+V*R*
4 series · 4 of 4 positions shown · non-contrast
Comparison: Humerus and forearm radiographs dictated separately.

CLINICAL DATA: Fall.  Shoulder arthroplasty 2 weeks ago.

EXAM:
RIGHT ELBOW - COMPLETE 3+ VIEW

[elbow ap]
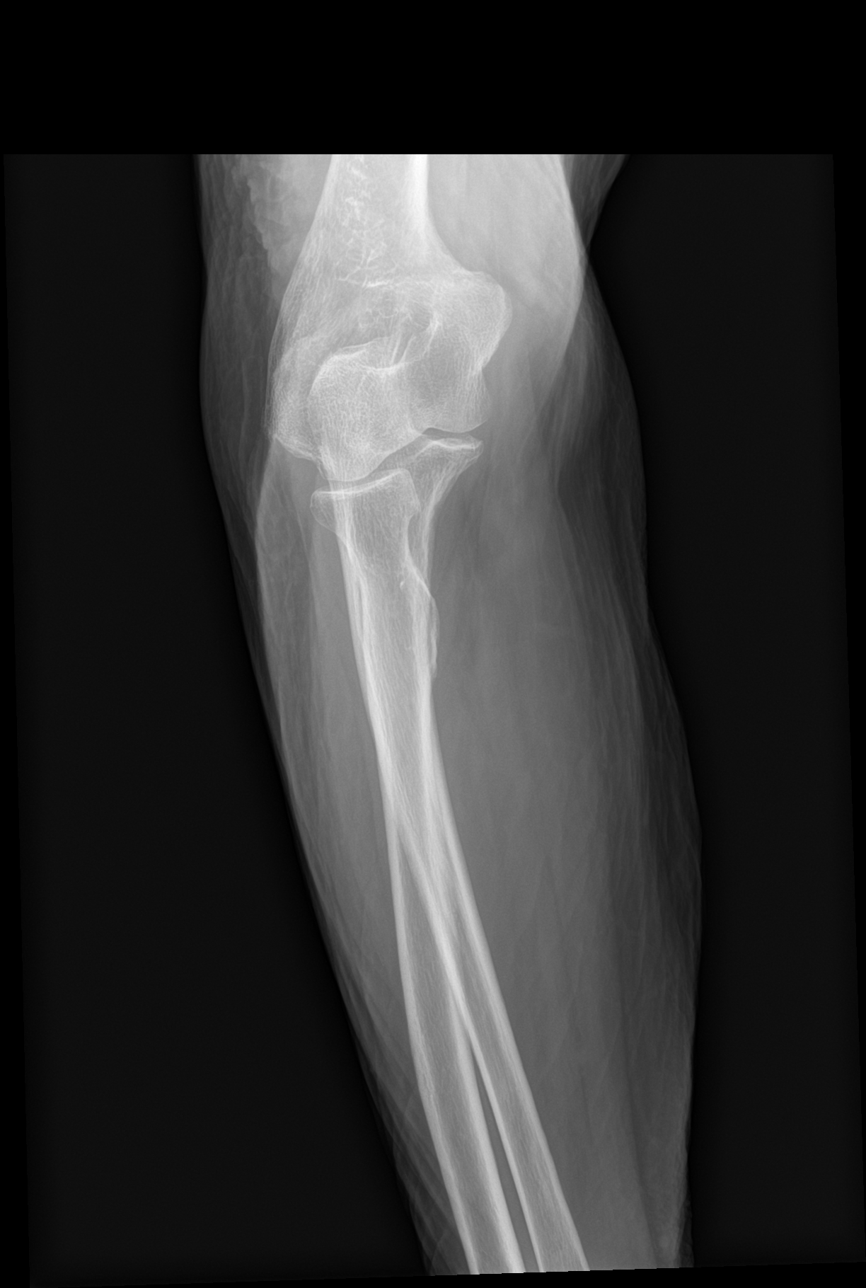

[elbow obl (1 of 2)]
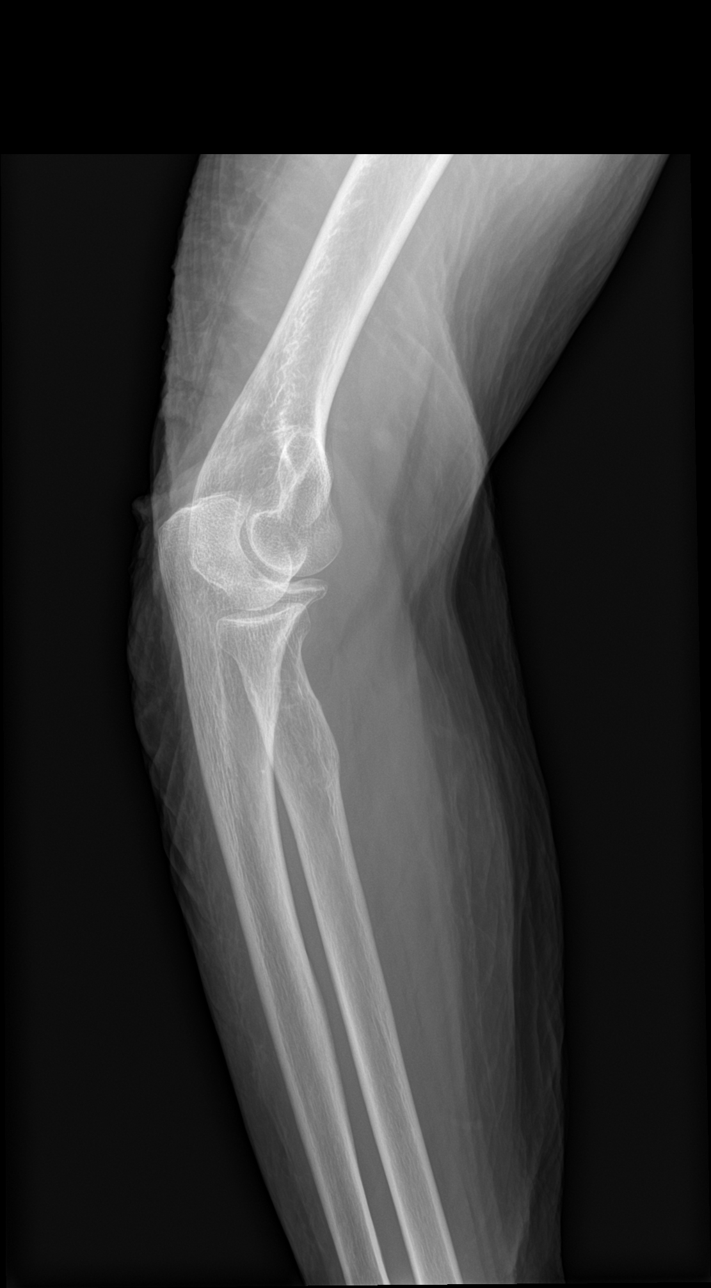

[elbow obl (2 of 2)]
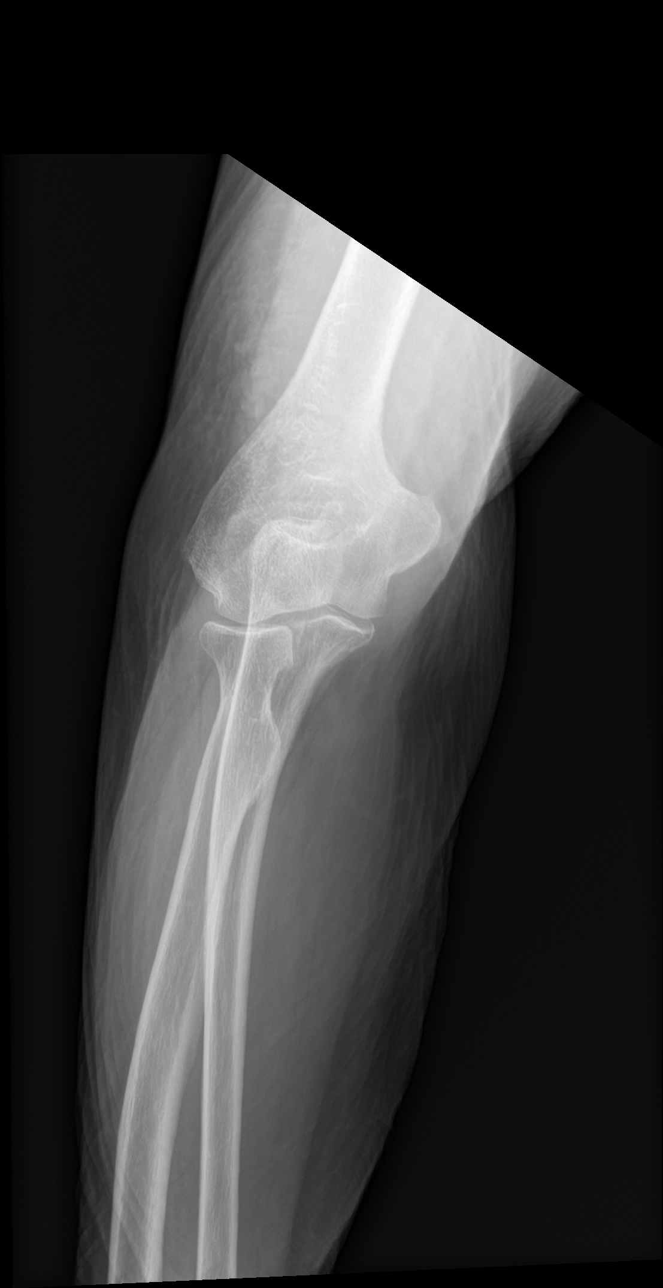

[elbow lat]
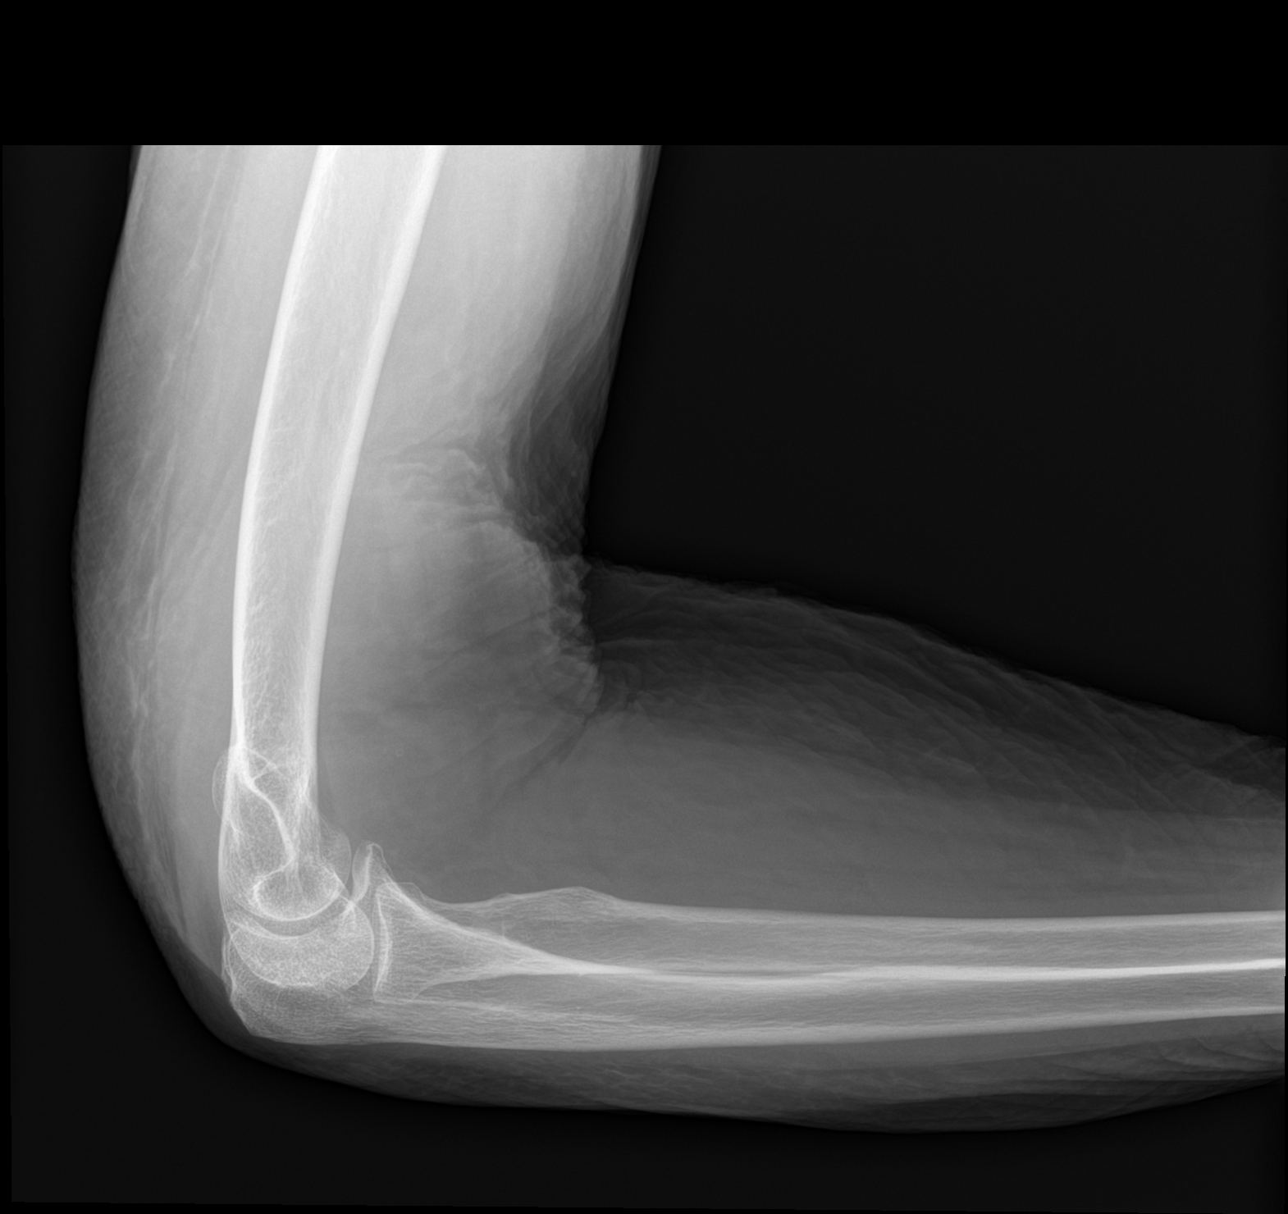

[4 of 4 positions shown; findings below may reference images not displayed]

FINDINGS: The technologist describes difficulty with patient positioning.
Given this mild limitation, no fracture or dislocation. No joint
effusion.
IMPRESSION: No acute osseous abnormality.

## 2023-12-14 IMAGING — CT CT HEAD W/O CM
4 series · 16 of 47 positions shown, 18 images · non-contrast
Comparison: 03/26/2021

CLINICAL DATA: Fall from standing.  Patient on Plavix

EXAM:
CT HEAD WITHOUT CONTRAST
CT CERVICAL SPINE WITHOUT CONTRAST
TECHNIQUE: Multidetector CT imaging of the head and cervical spine was
performed following the standard protocol without intravenous
contrast. Multiplanar CT image reconstructions of the cervical spine
were also generated.

[Series 2: head wo · axial · 0.45mm/px · z∈[-155,-40]mm · 7 of 31 slices shown, 9 images]
[im 4/31  brain]
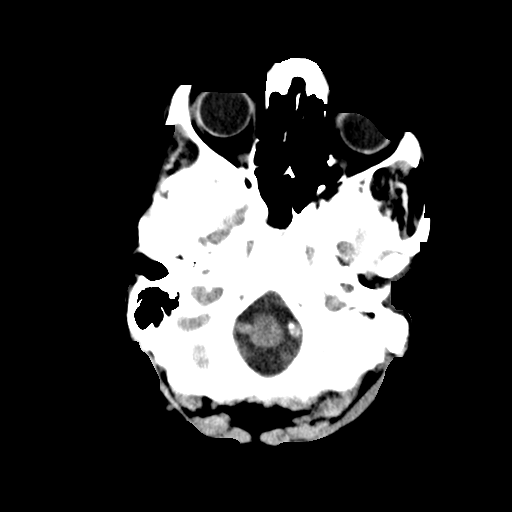
[im 4/31  bone]
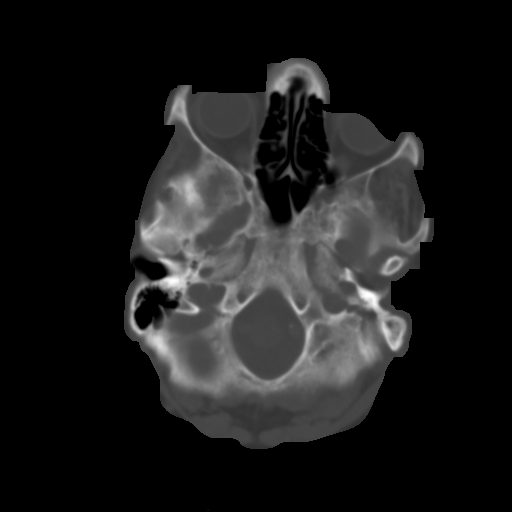
[im 8/31  brain]
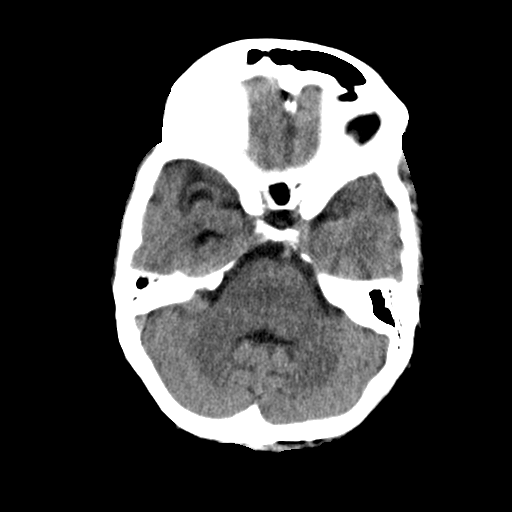
[im 12/31  brain]
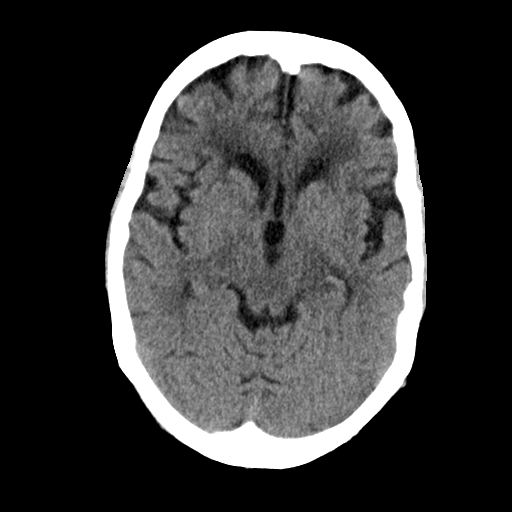
[im 16/31  brain]
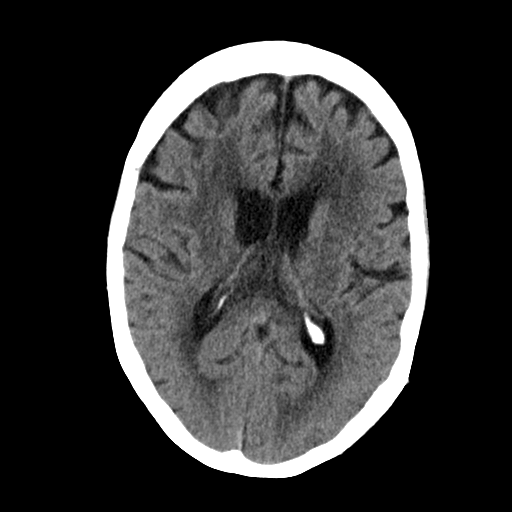
[im 19/31  brain]
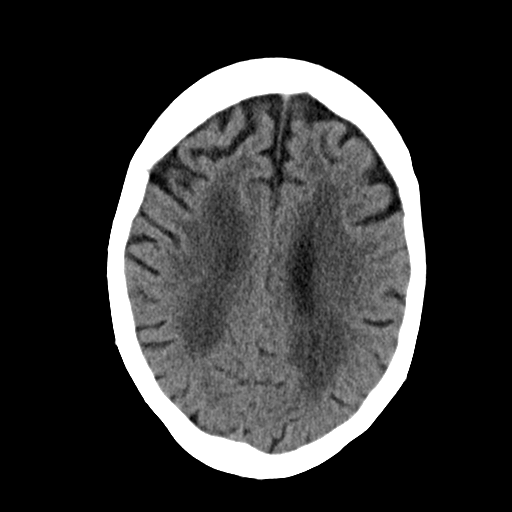
[im 19/31  bone]
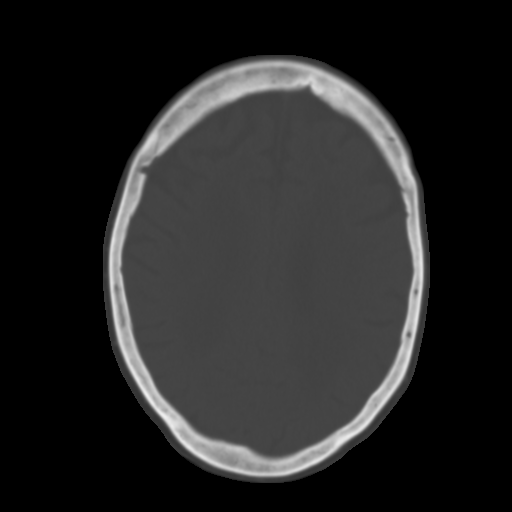
[im 23/31  brain]
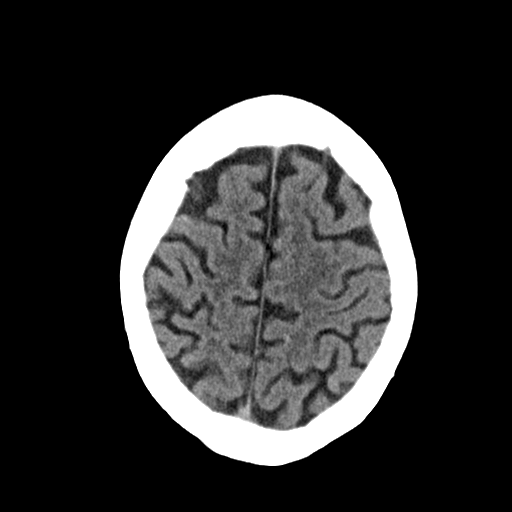
[im 27/31  brain]
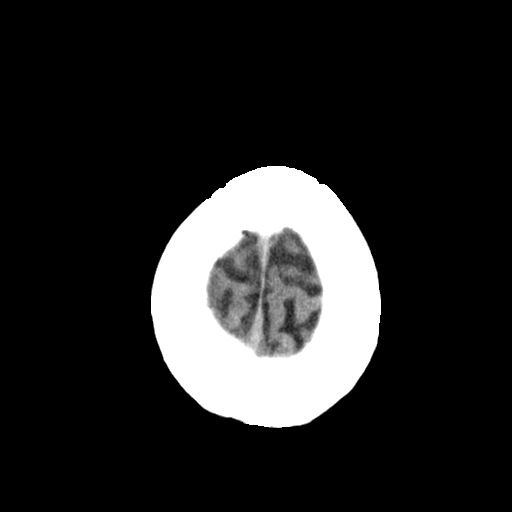

[Series 3: head bone · axial · 0.45mm/px · z∈[-156,-124]mm · 3 of 78 slices shown]
[im 8/78  bone]
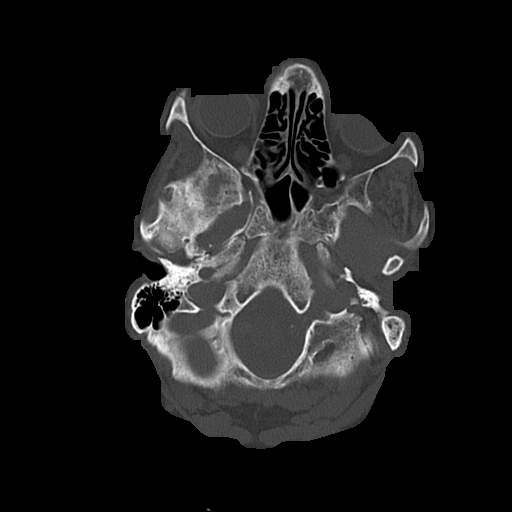
[im 16/78  bone]
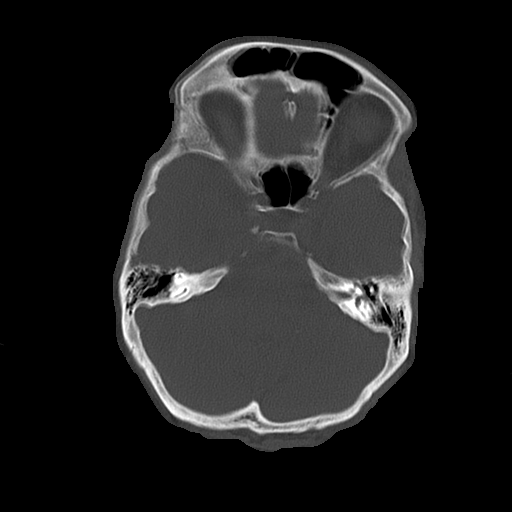
[im 24/78  bone]
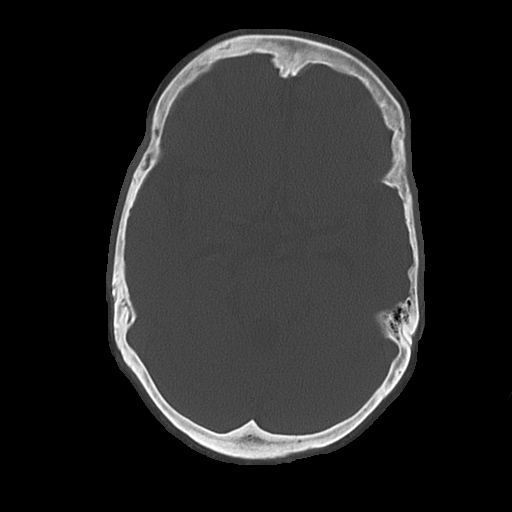

[Series 4: coronal soft · coronal · 0.34mm/px · 3 of 72 slices shown]
[im 24/72  brain]
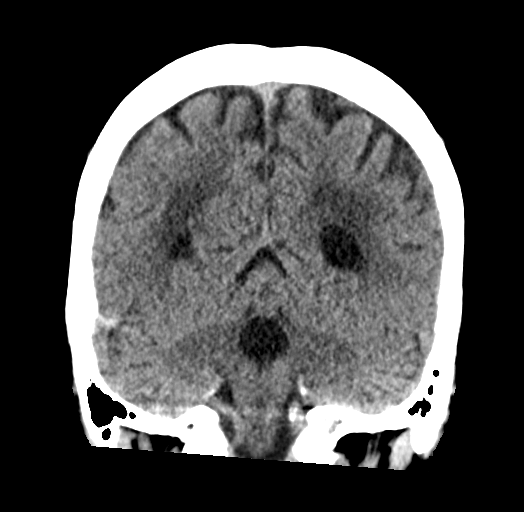
[im 32/72  brain]
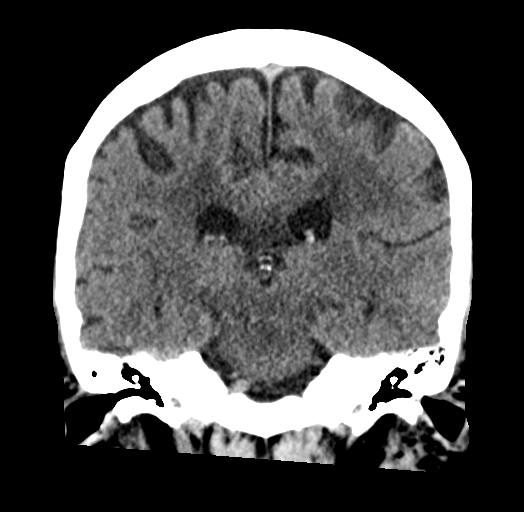
[im 40/72  brain]
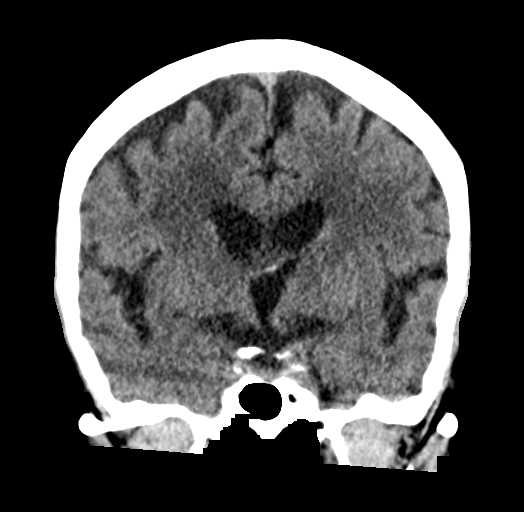

[Series 5: sagittal soft · sagittal · 0.33mm/px · 3 of 58 slices shown]
[im 20/58  brain]
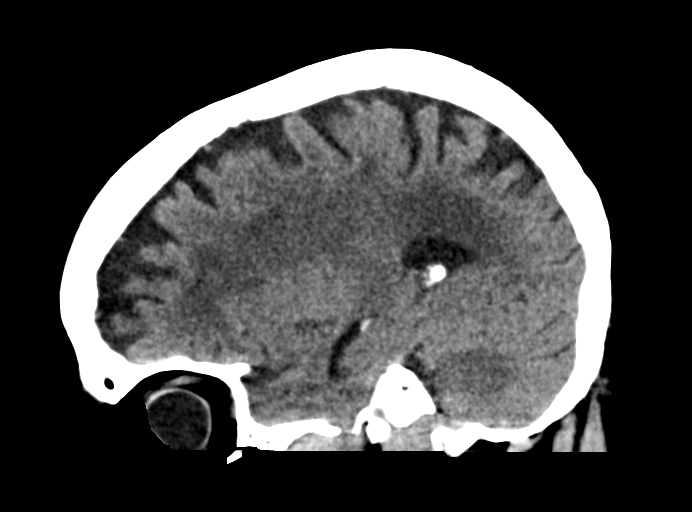
[im 29/58  brain]
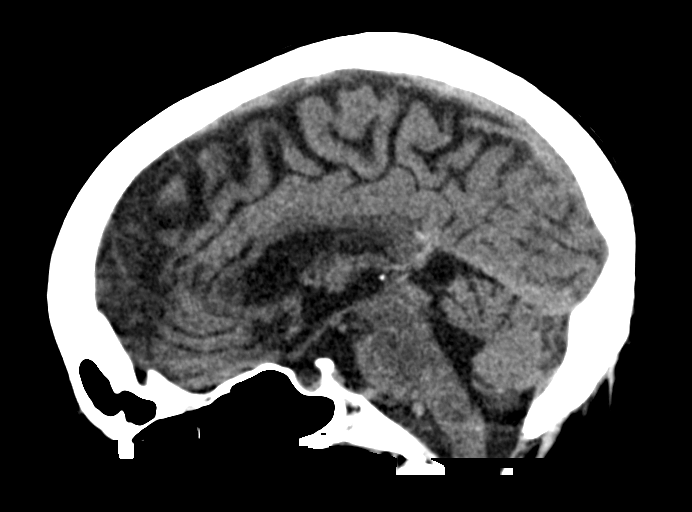
[im 39/58  brain]
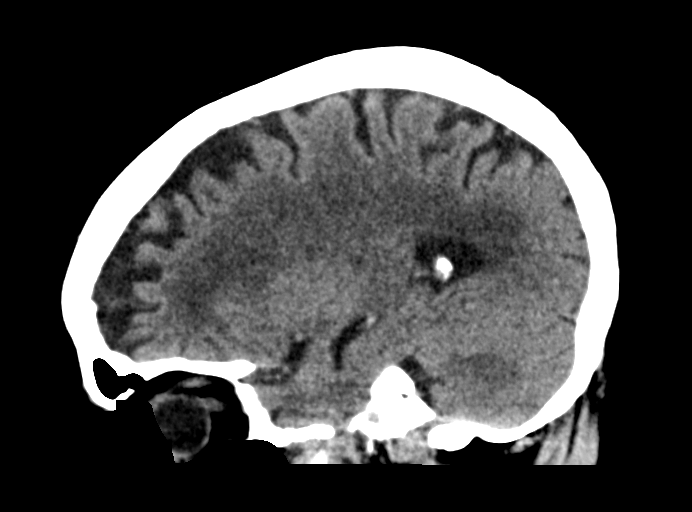

[16 of 47 positions shown; findings below may reference images not displayed]

FINDINGS: CT HEAD FINDINGS

Brain: No evidence of acute infarction, hemorrhage, hydrocephalus,
extra-axial collection or mass lesion/mass effect. Moderate
low-density changes within the periventricular and subcortical white
matter compatible with chronic microvascular ischemic change. Mild
diffuse cerebral volume loss.

Vascular: No hyperdense vessel or unexpected calcification.

Skull: Normal. Negative for fracture or focal lesion.

Sinuses/Orbits: No acute finding.

Other: Negative for scalp hematoma.

CT CERVICAL SPINE FINDINGS

Alignment: Facet joints are aligned without dislocation or traumatic
listhesis. Dens and lateral masses are aligned. Straightening of the
cervical lordosis without significant listhesis.

Skull base and vertebrae: No acute fracture. No primary bone lesion
or focal pathologic process.

Soft tissues and spinal canal: No prevertebral fluid or swelling. No
visible canal hematoma.

Disc levels: Moderate multilevel cervical spondylosis, without
interval progression from prior.

Upper chest: Emphysematous changes within the visualized lung
apices.

Other: None.
IMPRESSION: 1. No acute intracranial findings.
2. No evidence of acute fracture or traumatic listhesis of the
cervical spine.
3. Moderate multilevel cervical spondylosis.

## 2024-01-30 ENCOUNTER — Other Ambulatory Visit: Payer: Self-pay | Admitting: Physician Assistant

## 2024-02-18 ENCOUNTER — Telehealth: Payer: Self-pay

## 2024-02-18 NOTE — Telephone Encounter (Signed)
 Faxed respiratory order form to [facility] with confirmation @ [phone].
